# Patient Record
Sex: Female | Born: 1965
Health system: Southern US, Community
[De-identification: ages and names within clinical notes are randomized; demographics above are authoritative.]

## PROBLEM LIST (undated history)

## (undated) DIAGNOSIS — B20 Human immunodeficiency virus [HIV] disease: Secondary | ICD-10-CM

## (undated) DIAGNOSIS — L309 Dermatitis, unspecified: Secondary | ICD-10-CM

## (undated) DIAGNOSIS — M549 Dorsalgia, unspecified: Secondary | ICD-10-CM

## (undated) DIAGNOSIS — J45909 Unspecified asthma, uncomplicated: Secondary | ICD-10-CM

## (undated) DIAGNOSIS — N760 Acute vaginitis: Secondary | ICD-10-CM

## (undated) DIAGNOSIS — M1990 Unspecified osteoarthritis, unspecified site: Secondary | ICD-10-CM

## (undated) DIAGNOSIS — E785 Hyperlipidemia, unspecified: Secondary | ICD-10-CM

## (undated) DIAGNOSIS — C50912 Malignant neoplasm of unspecified site of left female breast: Secondary | ICD-10-CM

## (undated) DIAGNOSIS — M109 Gout, unspecified: Secondary | ICD-10-CM

## (undated) DIAGNOSIS — J209 Acute bronchitis, unspecified: Secondary | ICD-10-CM

## (undated) DIAGNOSIS — Z124 Encounter for screening for malignant neoplasm of cervix: Secondary | ICD-10-CM

## (undated) DIAGNOSIS — K5900 Constipation, unspecified: Secondary | ICD-10-CM

## (undated) DIAGNOSIS — I10 Essential (primary) hypertension: Secondary | ICD-10-CM

## (undated) DIAGNOSIS — Z0000 Encounter for general adult medical examination without abnormal findings: Secondary | ICD-10-CM

## (undated) DIAGNOSIS — R7611 Nonspecific reaction to tuberculin skin test without active tuberculosis: Secondary | ICD-10-CM

## (undated) DIAGNOSIS — L509 Urticaria, unspecified: Secondary | ICD-10-CM

## (undated) DIAGNOSIS — E559 Vitamin D deficiency, unspecified: Secondary | ICD-10-CM

## (undated) DIAGNOSIS — C801 Malignant (primary) neoplasm, unspecified: Secondary | ICD-10-CM

## (undated) DIAGNOSIS — T7840XA Allergy, unspecified, initial encounter: Secondary | ICD-10-CM

## (undated) DIAGNOSIS — Z21 Asymptomatic human immunodeficiency virus [HIV] infection status: Secondary | ICD-10-CM

## (undated) DIAGNOSIS — L299 Pruritus, unspecified: Secondary | ICD-10-CM

## (undated) DIAGNOSIS — E079 Disorder of thyroid, unspecified: Secondary | ICD-10-CM

## (undated) HISTORY — DX: Encounter for general adult medical examination without abnormal findings: Z00.00

## (undated) HISTORY — DX: Unspecified osteoarthritis, unspecified site: M19.90

## (undated) HISTORY — DX: Disorder of thyroid, unspecified: E07.9

## (undated) HISTORY — DX: Dermatitis, unspecified: L30.9

## (undated) HISTORY — DX: Acute vaginitis: N76.0

## (undated) HISTORY — DX: Malignant neoplasm of unspecified site of left female breast: C50.912

## (undated) HISTORY — PX: MASTECTOMY: SHX3

## (undated) HISTORY — DX: Constipation, unspecified: K59.00

## (undated) HISTORY — DX: Acute bronchitis, unspecified: J20.9

## (undated) HISTORY — DX: Pruritus, unspecified: L29.9

## (undated) HISTORY — DX: Encounter for screening for malignant neoplasm of cervix: Z12.4

## (undated) HISTORY — DX: Malignant (primary) neoplasm, unspecified: C80.1

## (undated) HISTORY — DX: Allergy, unspecified, initial encounter: T78.40XA

## (undated) HISTORY — DX: Urticaria, unspecified: L50.9

## (undated) HISTORY — DX: Essential (primary) hypertension: I10

## (undated) HISTORY — DX: Unspecified asthma, uncomplicated: J45.909

## (undated) HISTORY — DX: Gout, unspecified: M10.9

## (undated) HISTORY — DX: Human immunodeficiency virus (HIV) disease: B20

## (undated) HISTORY — DX: Vitamin D deficiency, unspecified: E55.9

## (undated) HISTORY — DX: Nonspecific reaction to tuberculin skin test without active tuberculosis: R76.11

## (undated) HISTORY — DX: Dorsalgia, unspecified: M54.9

## (undated) HISTORY — DX: Hyperlipidemia, unspecified: E78.5

## (undated) HISTORY — DX: Asymptomatic human immunodeficiency virus (hiv) infection status: Z21

---

## 2005-01-31 DIAGNOSIS — C801 Malignant (primary) neoplasm, unspecified: Secondary | ICD-10-CM

## 2005-01-31 DIAGNOSIS — C50912 Malignant neoplasm of unspecified site of left female breast: Secondary | ICD-10-CM

## 2005-01-31 HISTORY — DX: Malignant neoplasm of unspecified site of left female breast: C50.912

## 2005-01-31 HISTORY — DX: Malignant (primary) neoplasm, unspecified: C80.1

## 2013-01-31 LAB — HM MAMMOGRAPHY: HM Mammogram: NORMAL

## 2014-09-02 ENCOUNTER — Telehealth: Payer: Self-pay | Admitting: *Deleted

## 2014-09-02 NOTE — Telephone Encounter (Signed)
Medical records received via fax from Dr. Cherylin Mylar. Forwarded to Robin/Dr. Charlett Blake. JG//CMA

## 2014-09-03 ENCOUNTER — Telehealth: Payer: Self-pay | Admitting: *Deleted

## 2014-09-03 ENCOUNTER — Encounter: Payer: Self-pay | Admitting: *Deleted

## 2014-09-03 DIAGNOSIS — Z1239 Encounter for other screening for malignant neoplasm of breast: Secondary | ICD-10-CM

## 2014-09-03 NOTE — Telephone Encounter (Signed)
Unable to reach patient at time of Pre-Visit Call.  Left message for patient to return call when available.    

## 2014-09-03 NOTE — Telephone Encounter (Signed)
Pre-Visit Call completed with patient and chart updated.   Pre-Visit Info documented in Specialty Comments under [employer].    

## 2014-09-04 ENCOUNTER — Encounter: Payer: Self-pay | Admitting: Family Medicine

## 2014-09-04 ENCOUNTER — Ambulatory Visit (INDEPENDENT_AMBULATORY_CARE_PROVIDER_SITE_OTHER): Payer: BLUE CROSS/BLUE SHIELD | Admitting: Family Medicine

## 2014-09-04 VITALS — BP 124/80 | HR 71 | Temp 98.7°F | Ht 60.0 in | Wt 117.1 lb

## 2014-09-04 DIAGNOSIS — R112 Nausea with vomiting, unspecified: Secondary | ICD-10-CM | POA: Diagnosis not present

## 2014-09-04 DIAGNOSIS — M62838 Other muscle spasm: Secondary | ICD-10-CM

## 2014-09-04 DIAGNOSIS — L299 Pruritus, unspecified: Secondary | ICD-10-CM | POA: Diagnosis not present

## 2014-09-04 DIAGNOSIS — Z0000 Encounter for general adult medical examination without abnormal findings: Secondary | ICD-10-CM

## 2014-09-04 DIAGNOSIS — E559 Vitamin D deficiency, unspecified: Secondary | ICD-10-CM | POA: Insufficient documentation

## 2014-09-04 DIAGNOSIS — C50912 Malignant neoplasm of unspecified site of left female breast: Secondary | ICD-10-CM

## 2014-09-04 DIAGNOSIS — E079 Disorder of thyroid, unspecified: Secondary | ICD-10-CM | POA: Insufficient documentation

## 2014-09-04 DIAGNOSIS — N760 Acute vaginitis: Secondary | ICD-10-CM

## 2014-09-04 DIAGNOSIS — E785 Hyperlipidemia, unspecified: Secondary | ICD-10-CM

## 2014-09-04 HISTORY — DX: Pruritus, unspecified: L29.9

## 2014-09-04 MED ORDER — LORAZEPAM 0.5 MG PO TABS: 0.5000 mg | ORAL_TABLET | Freq: Two times a day (BID) | ORAL | Status: DC | PRN

## 2014-09-04 MED ORDER — ONDANSETRON HCL 4 MG PO TABS: 4.0000 mg | ORAL_TABLET | Freq: Three times a day (TID) | ORAL | Status: DC | PRN

## 2014-09-04 MED ORDER — METHOCARBAMOL 500 MG PO TABS: 500.0000 mg | ORAL_TABLET | Freq: Three times a day (TID) | ORAL | Status: DC | PRN

## 2014-09-04 NOTE — Progress Notes (Signed)
Gail Levine  [id number] 01-01-1966 09/04/2014      Progress Note-Follow Up  Subjective  Chief Complaint  Chief Complaint  Patient presents with  . Establish Care    HPI  Patient is a 49 y.o. female in today for routine medical care. Patient is in today to establish care. She has a past medical history significant for breast cancer and is requesting a referral to oncology. Also complains of intermittent pruritus but no rash.Her breast cancer was di and then recurrent in 2008. She has undergone chemotherapy and radiation. She reports her staging was 3 C. Feels well today. No recent illness. Denies CP/palp/SOB/HA/congestion/fevers/GI or GU c/o. Taking meds as prescribed  Past Medical History  Diagnosis Date  . Asthma   . Vitamin D deficiency   . Allergy   . Cancer     2007  . Positive PPD     Past Surgical History  Procedure Laterality Date  . Mastectomy      left breast    Family History  Problem Relation Age of Onset  . Cancer Mother     cancer  . Hypertension Father   . Stroke Father   . Heart disease Father   . Diabetes Father     History   Social History  . Marital Status: Single    Spouse Name: N/A  . Number of Children: N/A  . Years of Education: N/A   Occupational History  . patient care coordinator    Social History Main Topics  . Smoking status: Never Smoker   . Smokeless tobacco: Not on file  . Alcohol Use: 0.0 oz/week    0 Standard drinks or equivalent per week  . Drug Use: No  . Sexual Activity: Not on file   Other Topics Concern  . Not on file   Social History Narrative    Current Outpatient Prescriptions on File Prior to Visit  Medication Sig Dispense Refill  . albuterol (PROVENTIL HFA;VENTOLIN HFA) 108 (90 BASE) MCG/ACT inhaler Inhale 1 puff into the lungs every 6 (six) hours as needed for wheezing or shortness of breath.    . ALBUTEROL IN Inhale into the lungs.    . Ascorbic Acid (VITAMIN C) 1000 MG tablet Take 1,000 mg by mouth  daily.    Marland Kitchen b complex vitamins tablet Take 1 tablet by mouth daily.    . calcium carbonate (OS-CAL) 600 MG TABS tablet Take 1,800 mg by mouth 2 (two) times daily with a meal.    . cetirizine (ZYRTEC) 10 MG tablet Take 10 mg by mouth daily.    . diphenhydrAMINE (BENADRYL) 25 MG tablet Take 25 mg by mouth every 6 (six) hours as needed.    . Multiple Vitamin (MULTIVITAMIN) tablet Take 1 tablet by mouth daily.     No current facility-administered medications on file prior to visit.    Allergies  Allergen Reactions  . Sulfa Antibiotics Anaphylaxis    Immunization Status: Flu vaccine-- NA  Tdap-- unknown (will find records)  PNA-- NA  Shingles-- NA   A/P:  Changes to FH, PSH or Personal Hx: history of Breast CA in 2007 (states had surgeries but did not disclose, says she will bring the records of them) Pap-- would like to schedule at another appointment to do with Dr. Charlett Blake  MMG-- 2015 per patient, states she is due for another (order placed)  Bone Density-- NA  CCS-- NA   Care Teams Updated: no other providers yet (recently moved to area)  ED/Hospital/Urgent  Care Visits: none recent   **Patient plans to sign release of information form here to get info from previous PCP and Oncologist (will be with sister, Meit, at appointment  To Discuss with Provider: Patient would like referral for Dermatology and Oncology. She states she has been having muscle spasms and would like to talk about a muscle relaxer. Review of Systems  Review of Systems  Constitutional: Negative for fever, chills and malaise/fatigue.  HENT: Negative for congestion, hearing loss and nosebleeds.   Eyes: Negative for discharge.  Respiratory: Negative for cough, sputum production, shortness of breath and wheezing.   Cardiovascular: Negative for chest pain, palpitations and leg swelling.  Gastrointestinal: Negative for heartburn, nausea, vomiting, abdominal pain, diarrhea, constipation and blood in stool.    Genitourinary: Negative for dysuria, urgency, frequency and hematuria.  Musculoskeletal: Negative for myalgias, back pain and falls.  Skin: Positive for itching. Negative for rash.  Neurological: Negative for dizziness, tremors, sensory change, focal weakness, loss of consciousness, weakness and headaches.  Endo/Heme/Allergies: Negative for polydipsia. Does not bruise/bleed easily.  Psychiatric/Behavioral: Negative for depression and suicidal ideas. The patient is not nervous/anxious and does not have insomnia.     Objective  BP 124/80 mmHg  Pulse 71  Temp(Src) 98.7 F (37.1 C) (Oral)  Ht 5' (1.524 m)  Wt 117 lb 2 oz (53.128 kg)  BMI 22.87 kg/m2  SpO2 98%  LMP 08/25/2014  Physical Exam  Physical Exam  Constitutional: She is oriented to person, place, and time and well-developed, well-nourished, and in no distress. No distress.  HENT:  Head: Normocephalic and atraumatic.  Right Ear: External ear normal.  Left Ear: External ear normal.  Nose: Nose normal.  Mouth/Throat: Oropharynx is clear and moist. No oropharyngeal exudate.  Eyes: Conjunctivae are normal. Pupils are equal, round, and reactive to light. Right eye exhibits no discharge. Left eye exhibits no discharge. No scleral icterus.  Neck: Normal range of motion. Neck supple. No thyromegaly present.  Cardiovascular: Normal rate, regular rhythm, normal heart sounds and intact distal pulses.   No murmur heard. Pulmonary/Chest: Effort normal and breath sounds normal. No respiratory distress. She has no wheezes. She has no rales.  Abdominal: Soft. Bowel sounds are normal. She exhibits no distension and no mass. There is no tenderness.  Musculoskeletal: Normal range of motion. She exhibits no edema or tenderness.  Lymphadenopathy:    She has no cervical adenopathy.  Neurological: She is alert and oriented to person, place, and time. She has normal reflexes. No cranial nerve deficit. Coordination normal.  Skin: Skin is warm and  dry. No rash noted. She is not diaphoretic.  Psychiatric: Mood, memory and affect normal.     Assessment & Plan Breast cancer, left breast Will refer to oncology for surveillance  Vitamin D deficiency Encouraged daily supplements  Pruritus Encouraged daily Zyrtec and Zantac. Referred to dermatology for further consideration  Hyperlipidemia Encouraged heart healthy diet, increase exercise, avoid trans fats, consider a krill oil cap daily  Estrogen deficient vulvovaginitis Uses premarin cream sparingly

## 2014-09-04 NOTE — Progress Notes (Signed)
Pre visit review using our clinic review tool, if applicable. No additional management support is needed unless otherwise documented below in the visit note. 

## 2014-09-04 NOTE — Patient Instructions (Addendum)
Can use ginger caps, tea  Preventive Care for Adults A healthy lifestyle and preventive care can promote health and wellness. Preventive health guidelines for women include the following key practices.  A routine yearly physical is a good way to check with your health care provider about your health and preventive screening. It is a chance to share any concerns and updates on your health and to receive a thorough exam.  Visit your dentist for a routine exam and preventive care every 6 months. Brush your teeth twice a day and floss once a day. Good oral hygiene prevents tooth decay and gum disease.  The frequency of eye exams is based on your age, health, family medical history, use of contact lenses, and other factors. Follow your health care provider's recommendations for frequency of eye exams.  Eat a healthy diet. Foods like vegetables, fruits, whole grains, low-fat dairy products, and lean protein foods contain the nutrients you need without too many calories. Decrease your intake of foods high in solid fats, added sugars, and salt. Eat the right amount of calories for you.Get information about a proper diet from your health care provider, if necessary.  Regular physical exercise is one of the most important things you can do for your health. Most adults should get at least 150 minutes of moderate-intensity exercise (any activity that increases your heart rate and causes you to sweat) each week. In addition, most adults need muscle-strengthening exercises on 2 or more days a week.  Maintain a healthy weight. The body mass index (BMI) is a screening tool to identify possible weight problems. It provides an estimate of body fat based on height and weight. Your health care provider can find your BMI and can help you achieve or maintain a healthy weight.For adults 20 years and older:  A BMI below 18.5 is considered underweight.  A BMI of 18.5 to 24.9 is normal.  A BMI of 25 to 29.9 is  considered overweight.  A BMI of 30 and above is considered obese.  Maintain normal blood lipids and cholesterol levels by exercising and minimizing your intake of saturated fat. Eat a balanced diet with plenty of fruit and vegetables. Blood tests for lipids and cholesterol should begin at age 5 and be repeated every 5 years. If your lipid or cholesterol levels are high, you are over 50, or you are at high risk for heart disease, you may need your cholesterol levels checked more frequently.Ongoing high lipid and cholesterol levels should be treated with medicines if diet and exercise are not working.  If you smoke, find out from your health care provider how to quit. If you do not use tobacco, do not start.  Lung cancer screening is recommended for adults aged 2-80 years who are at high risk for developing lung cancer because of a history of smoking. A yearly low-dose CT scan of the lungs is recommended for people who have at least a 30-pack-year history of smoking and are a current smoker or have quit within the past 15 years. A pack year of smoking is smoking an average of 1 pack of cigarettes a day for 1 year (for example: 1 pack a day for 30 years or 2 packs a day for 15 years). Yearly screening should continue until the smoker has stopped smoking for at least 15 years. Yearly screening should be stopped for people who develop a health problem that would prevent them from having lung cancer treatment.  If you are pregnant, do not  drink alcohol. If you are breastfeeding, be very cautious about drinking alcohol. If you are not pregnant and choose to drink alcohol, do not have more than 1 drink per day. One drink is considered to be 12 ounces (355 mL) of beer, 5 ounces (148 mL) of wine, or 1.5 ounces (44 mL) of liquor.  Avoid use of street drugs. Do not share needles with anyone. Ask for help if you need support or instructions about stopping the use of drugs.  High blood pressure causes heart  disease and increases the risk of stroke. Your blood pressure should be checked at least every 1 to 2 years. Ongoing high blood pressure should be treated with medicines if weight loss and exercise do not work.  If you are 85-68 years old, ask your health care provider if you should take aspirin to prevent strokes.  Diabetes screening involves taking a blood sample to check your fasting blood sugar level. This should be done once every 3 years, after age 64, if you are within normal weight and without risk factors for diabetes. Testing should be considered at a younger age or be carried out more frequently if you are overweight and have at least 1 risk factor for diabetes.  Breast cancer screening is essential preventive care for women. You should practice "breast self-awareness." This means understanding the normal appearance and feel of your breasts and may include breast self-examination. Any changes detected, no matter how small, should be reported to a health care provider. Women in their 17s and 30s should have a clinical breast exam (CBE) by a health care provider as part of a regular health exam every 1 to 3 years. After age 79, women should have a CBE every year. Starting at age 66, women should consider having a mammogram (breast X-ray test) every year. Women who have a family history of breast cancer should talk to their health care provider about genetic screening. Women at a high risk of breast cancer should talk to their health care providers about having an MRI and a mammogram every year.  Breast cancer gene (BRCA)-related cancer risk assessment is recommended for women who have family members with BRCA-related cancers. BRCA-related cancers include breast, ovarian, tubal, and peritoneal cancers. Having family members with these cancers may be associated with an increased risk for harmful changes (mutations) in the breast cancer genes BRCA1 and BRCA2. Results of the assessment will determine  the need for genetic counseling and BRCA1 and BRCA2 testing.  Routine pelvic exams to screen for cancer are no longer recommended for nonpregnant women who are considered low risk for cancer of the pelvic organs (ovaries, uterus, and vagina) and who do not have symptoms. Ask your health care provider if a screening pelvic exam is right for you.  If you have had past treatment for cervical cancer or a condition that could lead to cancer, you need Pap tests and screening for cancer for at least 20 years after your treatment. If Pap tests have been discontinued, your risk factors (such as having a new sexual partner) need to be reassessed to determine if screening should be resumed. Some women have medical problems that increase the chance of getting cervical cancer. In these cases, your health care provider may recommend more frequent screening and Pap tests.  The HPV test is an additional test that may be used for cervical cancer screening. The HPV test looks for the virus that can cause the cell changes on the cervix. The cells collected  during the Pap test can be tested for HPV. The HPV test could be used to screen women aged 69 years and older, and should be used in women of any age who have unclear Pap test results. After the age of 66, women should have HPV testing at the same frequency as a Pap test.  Colorectal cancer can be detected and often prevented. Most routine colorectal cancer screening begins at the age of 52 years and continues through age 65 years. However, your health care provider may recommend screening at an earlier age if you have risk factors for colon cancer. On a yearly basis, your health care provider may provide home test kits to check for hidden blood in the stool. Use of a small camera at the end of a tube, to directly examine the colon (sigmoidoscopy or colonoscopy), can detect the earliest forms of colorectal cancer. Talk to your health care provider about this at age 54, when  routine screening begins. Direct exam of the colon should be repeated every 5-10 years through age 59 years, unless early forms of pre-cancerous polyps or small growths are found.  People who are at an increased risk for hepatitis B should be screened for this virus. You are considered at high risk for hepatitis B if:  You were born in a country where hepatitis B occurs often. Talk with your health care provider about which countries are considered high risk.  Your parents were born in a high-risk country and you have not received a shot to protect against hepatitis B (hepatitis B vaccine).  You have HIV or AIDS.  You use needles to inject street drugs.  You live with, or have sex with, someone who has hepatitis B.  You get hemodialysis treatment.  You take certain medicines for conditions like cancer, organ transplantation, and autoimmune conditions.  Hepatitis C blood testing is recommended for all people born from [birth year] through 1963 and any individual with known risks for hepatitis C.  Practice safe sex. Use condoms and avoid high-risk sexual practices to reduce the spread of sexually transmitted infections (STIs). STIs include gonorrhea, chlamydia, syphilis, trichomonas, herpes, HPV, and human immunodeficiency virus (HIV). Herpes, HIV, and HPV are viral illnesses that have no cure. They can result in disability, cancer, and death.  You should be screened for sexually transmitted illnesses (STIs) including gonorrhea and chlamydia if:  You are sexually active and are younger than 24 years.  You are older than 24 years and your health care provider tells you that you are at risk for this type of infection.  Your sexual activity has changed since you were last screened and you are at an increased risk for chlamydia or gonorrhea. Ask your health care provider if you are at risk.  If you are at risk of being infected with HIV, it is recommended that you take a prescription medicine daily  to prevent HIV infection. This is called preexposure prophylaxis (PrEP). You are considered at risk if:  You are a heterosexual woman, are sexually active, and are at increased risk for HIV infection.  You take drugs by injection.  You are sexually active with a partner who has HIV.  Talk with your health care provider about whether you are at high risk of being infected with HIV. If you choose to begin PrEP, you should first be tested for HIV. You should then be tested every 3 months for as long as you are taking PrEP.  Osteoporosis is a disease in which  the bones lose minerals and strength with aging. This can result in serious bone fractures or breaks. The risk of osteoporosis can be identified using a bone density scan. Women ages 60 years and over and women at risk for fractures or osteoporosis should discuss screening with their health care providers. Ask your health care provider whether you should take a calcium supplement or vitamin D to reduce the rate of osteoporosis.  Menopause can be associated with physical symptoms and risks. Hormone replacement therapy is available to decrease symptoms and risks. You should talk to your health care provider about whether hormone replacement therapy is right for you.  Use sunscreen. Apply sunscreen liberally and repeatedly throughout the day. You should seek shade when your shadow is shorter than you. Protect yourself by wearing long sleeves, pants, a wide-brimmed hat, and sunglasses year round, whenever you are outdoors.  Once a month, do a whole body skin exam, using a mirror to look at the skin on your back. Tell your health care provider of new moles, moles that have irregular borders, moles that are larger than a pencil eraser, or moles that have changed in shape or color.  Stay current with required vaccines (immunizations).  Influenza vaccine. All adults should be immunized every year.  Tetanus, diphtheria, and acellular pertussis (Td,  Tdap) vaccine. Pregnant women should receive 1 dose of Tdap vaccine during each pregnancy. The dose should be obtained regardless of the length of time since the last dose. Immunization is preferred during the 27th-36th week of gestation. An adult who has not previously received Tdap or who does not know her vaccine status should receive 1 dose of Tdap. This initial dose should be followed by tetanus and diphtheria toxoids (Td) booster doses every 10 years. Adults with an unknown or incomplete history of completing a 3-dose immunization series with Td-containing vaccines should begin or complete a primary immunization series including a Tdap dose. Adults should receive a Td booster every 10 years.  Varicella vaccine. An adult without evidence of immunity to varicella should receive 2 doses or a second dose if she has previously received 1 dose. Pregnant females who do not have evidence of immunity should receive the first dose after pregnancy. This first dose should be obtained before leaving the health care facility. The second dose should be obtained 4-8 weeks after the first dose.  Human papillomavirus (HPV) vaccine. Females aged 13-26 years who have not received the vaccine previously should obtain the 3-dose series. The vaccine is not recommended for use in pregnant females. However, pregnancy testing is not needed before receiving a dose. If a female is found to be pregnant after receiving a dose, no treatment is needed. In that case, the remaining doses should be delayed until after the pregnancy. Immunization is recommended for any person with an immunocompromised condition through the age of 51 years if she did not get any or all doses earlier. During the 3-dose series, the second dose should be obtained 4-8 weeks after the first dose. The third dose should be obtained 24 weeks after the first dose and 16 weeks after the second dose.  Zoster vaccine. One dose is recommended for adults aged 78 years or  older unless certain conditions are present.  Measles, mumps, and rubella (MMR) vaccine. Adults born before [birth year] generally are considered immune to measles and mumps. Adults born in [birth year] or later should have 1 or more doses of MMR vaccine unless there is a contraindication to the vaccine or there  is laboratory evidence of immunity to each of the three diseases. A routine second dose of MMR vaccine should be obtained at least 28 days after the first dose for students attending postsecondary schools, health care workers, or international travelers. People who received inactivated measles vaccine or an unknown type of measles vaccine during 1963-1967 should receive 2 doses of MMR vaccine. People who received inactivated mumps vaccine or an unknown type of mumps vaccine before 1979 and are at high risk for mumps infection should consider immunization with 2 doses of MMR vaccine. For females of childbearing age, rubella immunity should be determined. If there is no evidence of immunity, females who are not pregnant should be vaccinated. If there is no evidence of immunity, females who are pregnant should delay immunization until after pregnancy. Unvaccinated health care workers born before [birth year] who lack laboratory evidence of measles, mumps, or rubella immunity or laboratory confirmation of disease should consider measles and mumps immunization with 2 doses of MMR vaccine or rubella immunization with 1 dose of MMR vaccine.  Pneumococcal 13-valent conjugate (PCV13) vaccine. When indicated, a person who is uncertain of her immunization history and has no record of immunization should receive the PCV13 vaccine. An adult aged 93 years or older who has certain medical conditions and has not been previously immunized should receive 1 dose of PCV13 vaccine. This PCV13 should be followed with a dose of pneumococcal polysaccharide (PPSV23) vaccine. The PPSV23 vaccine dose should be obtained at least 8 weeks after the dose of  PCV13 vaccine. An adult aged 62 years or older who has certain medical conditions and previously received 1 or more doses of PPSV23 vaccine should receive 1 dose of PCV13. The PCV13 vaccine dose should be obtained 1 or more years after the last PPSV23 vaccine dose.  Pneumococcal polysaccharide (PPSV23) vaccine. When PCV13 is also indicated, PCV13 should be obtained first. All adults aged 58 years and older should be immunized. An adult younger than age 62 years who has certain medical conditions should be immunized. Any person who resides in a nursing home or long-term care facility should be immunized. An adult smoker should be immunized. People with an immunocompromised condition and certain other conditions should receive both PCV13 and PPSV23 vaccines. People with human immunodeficiency virus (HIV) infection should be immunized as soon as possible after diagnosis. Immunization during chemotherapy or radiation therapy should be avoided. Routine use of PPSV23 vaccine is not recommended for American Indians, [place] Natives, or people younger than 65 years unless there are medical conditions that require PPSV23 vaccine. When indicated, people who have unknown immunization and have no record of immunization should receive PPSV23 vaccine. One-time revaccination 5 years after the first dose of PPSV23 is recommended for people aged 19-64 years who have chronic kidney failure, nephrotic syndrome, asplenia, or immunocompromised conditions. People who received 1-2 doses of PPSV23 before age 29 years should receive another dose of PPSV23 vaccine at age 13 years or later if at least 5 years have passed since the previous dose. Doses of PPSV23 are not needed for people immunized with PPSV23 at or after age 38 years.  Meningococcal vaccine. Adults with asplenia or persistent complement component deficiencies should receive 2 doses of quadrivalent meningococcal conjugate (MenACWY-D) vaccine. The doses should be obtained at  least 2 months apart. Microbiologists working with certain meningococcal bacteria, [employer] recruits, people at risk during an outbreak, and people who travel to or live in countries with a high rate of meningitis should be immunized. A  first-year college student up through age 54 years who is living in a residence hall should receive a dose if she did not receive a dose on or after her 16th birthday. Adults who have certain high-risk conditions should receive one or more doses of vaccine.  Hepatitis A vaccine. Adults who wish to be protected from this disease, have certain high-risk conditions, work with hepatitis A-infected animals, work in hepatitis A research labs, or travel to or work in countries with a high rate of hepatitis A should be immunized. Adults who were previously unvaccinated and who anticipate close contact with an international adoptee during the first 60 days after arrival in the [place] from a country with a high rate of hepatitis A should be immunized.  Hepatitis B vaccine. Adults who wish to be protected from this disease, have certain high-risk conditions, may be exposed to blood or other infectious body fluids, are household contacts or sex partners of hepatitis B positive people, are clients or workers in certain care facilities, or travel to or work in countries with a high rate of hepatitis B should be immunized.  Haemophilus influenzae type b (Hib) vaccine. A previously unvaccinated person with asplenia or sickle cell disease or having a scheduled splenectomy should receive 1 dose of Hib vaccine. Regardless of previous immunization, a recipient of a hematopoietic stem cell transplant should receive a 3-dose series 6-12 months after her successful transplant. Hib vaccine is not recommended for adults with HIV infection. Preventive Services / Frequency Ages 66 to 25 years  Blood pressure check.** / Every 1 to 2 years.  Lipid and cholesterol check.** / Every 5 years  beginning at age 20.  Clinical breast exam.** / Every 3 years for women in their 75s and 47s.  BRCA-related cancer risk assessment.** / For women who have family members with a BRCA-related cancer (breast, ovarian, tubal, or peritoneal cancers).  Pap test.** / Every 2 years from ages 33 through 28. Every 3 years starting at age 50 through age 6 or 64 with a history of 3 consecutive normal Pap tests.  HPV screening.** / Every 3 years from ages 63 through ages 42 to 28 with a history of 3 consecutive normal Pap tests.  Hepatitis C blood test.** / For any individual with known risks for hepatitis C.  Skin self-exam. / Monthly.  Influenza vaccine. / Every year.  Tetanus, diphtheria, and acellular pertussis (Tdap, Td) vaccine.** / Consult your health care provider. Pregnant women should receive 1 dose of Tdap vaccine during each pregnancy. 1 dose of Td every 10 years.  Varicella vaccine.** / Consult your health care provider. Pregnant females who do not have evidence of immunity should receive the first dose after pregnancy.  HPV vaccine. / 3 doses over 6 months, if 42 and younger. The vaccine is not recommended for use in pregnant females. However, pregnancy testing is not needed before receiving a dose.  Measles, mumps, rubella (MMR) vaccine.** / You need at least 1 dose of MMR if you were born in 1957 or later. You may also need a 2nd dose. For females of childbearing age, rubella immunity should be determined. If there is no evidence of immunity, females who are not pregnant should be vaccinated. If there is no evidence of immunity, females who are pregnant should delay immunization until after pregnancy.  Pneumococcal 13-valent conjugate (PCV13) vaccine.** / Consult your health care provider.  Pneumococcal polysaccharide (PPSV23) vaccine.** / 1 to 2 doses if you smoke cigarettes or if you  have certain conditions.  Meningococcal vaccine.** / 1 dose if you are age 40 to 1 years and a  Market researcher living in a residence hall, or have one of several medical conditions, you need to get vaccinated against meningococcal disease. You may also need additional booster doses.  Hepatitis A vaccine.** / Consult your health care provider.  Hepatitis B vaccine.** / Consult your health care provider.  Haemophilus influenzae type b (Hib) vaccine.** / Consult your health care provider. Ages 67 to 54 years  Blood pressure check.** / Every 1 to 2 years.  Lipid and cholesterol check.** / Every 5 years beginning at age 61 years.  Lung cancer screening. / Every year if you are aged 36-80 years and have a 30-pack-year history of smoking and currently smoke or have quit within the past 15 years. Yearly screening is stopped once you have quit smoking for at least 15 years or develop a health problem that would prevent you from having lung cancer treatment.  Clinical breast exam.** / Every year after age 42 years.  BRCA-related cancer risk assessment.** / For women who have family members with a BRCA-related cancer (breast, ovarian, tubal, or peritoneal cancers).  Mammogram.** / Every year beginning at age 85 years and continuing for as long as you are in good health. Consult with your health care provider.  Pap test.** / Every 3 years starting at age 13 years through age 11 or 75 years with a history of 3 consecutive normal Pap tests.  HPV screening.** / Every 3 years from ages 49 years through ages 41 to 65 years with a history of 3 consecutive normal Pap tests.  Fecal occult blood test (FOBT) of stool. / Every year beginning at age 56 years and continuing until age 52 years. You may not need to do this test if you get a colonoscopy every 10 years.  Flexible sigmoidoscopy or colonoscopy.** / Every 5 years for a flexible sigmoidoscopy or every 10 years for a colonoscopy beginning at age 18 years and continuing until age 27 years.  Hepatitis C blood test.** / For all people  born from [birth year] through 1965 and any individual with known risks for hepatitis C.  Skin self-exam. / Monthly.  Influenza vaccine. / Every year.  Tetanus, diphtheria, and acellular pertussis (Tdap/Td) vaccine.** / Consult your health care provider. Pregnant women should receive 1 dose of Tdap vaccine during each pregnancy. 1 dose of Td every 10 years.  Varicella vaccine.** / Consult your health care provider. Pregnant females who do not have evidence of immunity should receive the first dose after pregnancy.  Zoster vaccine.** / 1 dose for adults aged 41 years or older.  Measles, mumps, rubella (MMR) vaccine.** / You need at least 1 dose of MMR if you were born in 1957 or later. You may also need a 2nd dose. For females of childbearing age, rubella immunity should be determined. If there is no evidence of immunity, females who are not pregnant should be vaccinated. If there is no evidence of immunity, females who are pregnant should delay immunization until after pregnancy.  Pneumococcal 13-valent conjugate (PCV13) vaccine.** / Consult your health care provider.  Pneumococcal polysaccharide (PPSV23) vaccine.** / 1 to 2 doses if you smoke cigarettes or if you have certain conditions.  Meningococcal vaccine.** / Consult your health care provider.  Hepatitis A vaccine.** / Consult your health care provider.  Hepatitis B vaccine.** / Consult your health care provider.  Haemophilus influenzae type b (Hib) vaccine.** /  Consult your health care provider. Ages 71 years and over  Blood pressure check.** / Every 1 to 2 years.  Lipid and cholesterol check.** / Every 5 years beginning at age 27 years.  Lung cancer screening. / Every year if you are aged 101-80 years and have a 30-pack-year history of smoking and currently smoke or have quit within the past 15 years. Yearly screening is stopped once you have quit smoking for at least 15 years or develop a health problem that would prevent you from  having lung cancer treatment.  Clinical breast exam.** / Every year after age 27 years.  BRCA-related cancer risk assessment.** / For women who have family members with a BRCA-related cancer (breast, ovarian, tubal, or peritoneal cancers).  Mammogram.** / Every year beginning at age 39 years and continuing for as long as you are in good health. Consult with your health care provider.  Pap test.** / Every 3 years starting at age 1 years through age 71 or 21 years with 3 consecutive normal Pap tests. Testing can be stopped between 65 and 70 years with 3 consecutive normal Pap tests and no abnormal Pap or HPV tests in the past 10 years.  HPV screening.** / Every 3 years from ages 98 years through ages 23 or 70 years with a history of 3 consecutive normal Pap tests. Testing can be stopped between 65 and 70 years with 3 consecutive normal Pap tests and no abnormal Pap or HPV tests in the past 10 years.  Fecal occult blood test (FOBT) of stool. / Every year beginning at age 54 years and continuing until age 74 years. You may not need to do this test if you get a colonoscopy every 10 years.  Flexible sigmoidoscopy or colonoscopy.** / Every 5 years for a flexible sigmoidoscopy or every 10 years for a colonoscopy beginning at age 67 years and continuing until age 8 years.  Hepatitis C blood test.** / For all people born from [birth year] through 1963 and any individual with known risks for hepatitis C.  Osteoporosis screening.** / A one-time screening for women ages 32 years and over and women at risk for fractures or osteoporosis.  Skin self-exam. / Monthly.  Influenza vaccine. / Every year.  Tetanus, diphtheria, and acellular pertussis (Tdap/Td) vaccine.** / 1 dose of Td every 10 years.  Varicella vaccine.** / Consult your health care provider.  Zoster vaccine.** / 1 dose for adults aged 84 years or older.  Pneumococcal 13-valent conjugate (PCV13) vaccine.** / Consult your health care  provider.  Pneumococcal polysaccharide (PPSV23) vaccine.** / 1 dose for all adults aged 75 years and older.  Meningococcal vaccine.** / Consult your health care provider.  Hepatitis A vaccine.** / Consult your health care provider.  Hepatitis B vaccine.** / Consult your health care provider.  Haemophilus influenzae type b (Hib) vaccine.** / Consult your health care provider. ** Family history and personal history of risk and conditions may change your health care provider's recommendations. Document Released: 03/15/2001 Document Revised: 06/03/2013 Document Reviewed: 06/14/2010 [facility] Patient Information 2015 [place], [place]. This information is not intended to replace advice given to you by your health care provider. Make sure you discuss any questions you have with your health care provider.

## 2014-09-05 LAB — TSH: TSH: 2.72 u[IU]/mL (ref 0.35–4.50)

## 2014-09-05 LAB — COMPREHENSIVE METABOLIC PANEL
ALK PHOS: 49 U/L (ref 39–117)
ALT: 12 U/L (ref 0–35)
AST: 20 U/L (ref 0–37)
Albumin: 4.1 g/dL (ref 3.5–5.2)
BILIRUBIN TOTAL: 0.5 mg/dL (ref 0.2–1.2)
BUN: 10 mg/dL (ref 6–23)
CO2: 29 mEq/L (ref 19–32)
CREATININE: 0.7 mg/dL (ref 0.40–1.20)
Calcium: 8.8 mg/dL (ref 8.4–10.5)
Chloride: 104 mEq/L (ref 96–112)
GFR: 95.43 mL/min (ref >60.00)
GLUCOSE: 83 mg/dL (ref 70–99)
Potassium: 3.7 mEq/L (ref 3.5–5.1)
SODIUM: 140 meq/L (ref 135–145)
TOTAL PROTEIN: 7.7 g/dL (ref 6.0–8.3)

## 2014-09-05 LAB — LIPID PANEL
Cholesterol: 184 mg/dL (ref 0–200)
HDL: 66.5 mg/dL (ref >39.00)
LDL Cholesterol: 102 mg/dL — ABNORMAL HIGH (ref 0–99)
NONHDL: 117.41
Total CHOL/HDL Ratio: 3
Triglycerides: 75 mg/dL (ref 0.0–149.0)
VLDL: 15 mg/dL (ref 0.0–40.0)

## 2014-09-05 LAB — CBC
HEMATOCRIT: 39 % (ref 36.0–46.0)
HEMOGLOBIN: 12.8 g/dL (ref 12.0–15.0)
MCHC: 32.7 g/dL (ref 30.0–36.0)
MCV: 93.2 fl (ref 78.0–100.0)
Platelets: 225 10*3/uL (ref 150.0–400.0)
RBC: 4.19 Mil/uL (ref 3.87–5.11)
RDW: 13.2 % (ref 11.5–15.5)
WBC: 4.7 10*3/uL (ref 4.0–10.5)

## 2014-09-05 LAB — T4, FREE: Free T4: 0.76 ng/dL (ref 0.60–1.60)

## 2014-09-10 ENCOUNTER — Telehealth: Payer: Self-pay | Admitting: Hematology & Oncology

## 2014-09-10 NOTE — Telephone Encounter (Signed)
Lt mess regarding new pt appt on 9/21

## 2014-09-12 ENCOUNTER — Other Ambulatory Visit: Payer: Self-pay | Admitting: Family Medicine

## 2014-09-12 DIAGNOSIS — Z9012 Acquired absence of left breast and nipple: Secondary | ICD-10-CM

## 2014-09-12 DIAGNOSIS — Z1239 Encounter for other screening for malignant neoplasm of breast: Secondary | ICD-10-CM

## 2014-09-14 ENCOUNTER — Encounter: Payer: Self-pay | Admitting: Family Medicine

## 2014-09-14 DIAGNOSIS — Z171 Estrogen receptor negative status [ER-]: Secondary | ICD-10-CM | POA: Insufficient documentation

## 2014-09-14 DIAGNOSIS — E785 Hyperlipidemia, unspecified: Secondary | ICD-10-CM | POA: Insufficient documentation

## 2014-09-14 DIAGNOSIS — C50012 Malignant neoplasm of nipple and areola, left female breast: Secondary | ICD-10-CM | POA: Insufficient documentation

## 2014-09-14 DIAGNOSIS — N760 Acute vaginitis: Secondary | ICD-10-CM

## 2014-09-14 DIAGNOSIS — R7611 Nonspecific reaction to tuberculin skin test without active tuberculosis: Secondary | ICD-10-CM | POA: Insufficient documentation

## 2014-09-14 HISTORY — DX: Acute vaginitis: N76.0

## 2014-09-14 NOTE — Assessment & Plan Note (Signed)
Encouraged daily supplements 

## 2014-09-14 NOTE — Assessment & Plan Note (Signed)
Encouraged heart healthy diet, increase exercise, avoid trans fats, consider a krill oil cap daily 

## 2014-09-14 NOTE — Assessment & Plan Note (Signed)
Encouraged daily Zyrtec and Zantac. Referred to dermatology for further consideration

## 2014-09-14 NOTE — Assessment & Plan Note (Signed)
Uses premarin cream sparingly

## 2014-09-14 NOTE — Assessment & Plan Note (Addendum)
Will refer to oncology for surveillance

## 2014-10-03 ENCOUNTER — Telehealth: Payer: Self-pay | Admitting: Hematology & Oncology

## 2014-10-03 NOTE — Telephone Encounter (Signed)
Mailed out calendar and new patient letter

## 2014-10-22 ENCOUNTER — Other Ambulatory Visit: Payer: BLUE CROSS/BLUE SHIELD

## 2014-10-22 ENCOUNTER — Ambulatory Visit: Payer: BLUE CROSS/BLUE SHIELD | Admitting: Hematology & Oncology

## 2014-10-22 ENCOUNTER — Ambulatory Visit: Payer: BLUE CROSS/BLUE SHIELD

## 2014-10-23 ENCOUNTER — Ambulatory Visit
Admission: RE | Admit: 2014-10-23 | Discharge: 2014-10-23 | Disposition: A | Discharge status: Home / Self Care | Payer: BLUE CROSS/BLUE SHIELD | Source: Ambulatory Visit | Attending: Family Medicine | Admitting: Family Medicine

## 2014-10-23 DIAGNOSIS — Z9012 Acquired absence of left breast and nipple: Secondary | ICD-10-CM

## 2014-10-23 DIAGNOSIS — Z1239 Encounter for other screening for malignant neoplasm of breast: Secondary | ICD-10-CM

## 2014-11-20 ENCOUNTER — Encounter: Payer: Self-pay | Admitting: Family

## 2014-11-20 ENCOUNTER — Other Ambulatory Visit (HOSPITAL_BASED_OUTPATIENT_CLINIC_OR_DEPARTMENT_OTHER): Payer: BLUE CROSS/BLUE SHIELD

## 2014-11-20 ENCOUNTER — Ambulatory Visit (HOSPITAL_BASED_OUTPATIENT_CLINIC_OR_DEPARTMENT_OTHER): Payer: BLUE CROSS/BLUE SHIELD | Admitting: Family

## 2014-11-20 ENCOUNTER — Ambulatory Visit: Payer: BLUE CROSS/BLUE SHIELD

## 2014-11-20 VITALS — BP 109/66 | HR 66 | Temp 97.8°F | Resp 16 | Ht 60.0 in | Wt 119.0 lb

## 2014-11-20 DIAGNOSIS — C50012 Malignant neoplasm of nipple and areola, left female breast: Secondary | ICD-10-CM | POA: Diagnosis not present

## 2014-11-20 DIAGNOSIS — Z853 Personal history of malignant neoplasm of breast: Secondary | ICD-10-CM | POA: Diagnosis not present

## 2014-11-20 LAB — COMPREHENSIVE METABOLIC PANEL (CC13)
ALT: 14 U/L (ref 0–55)
AST: 21 U/L (ref 5–34)
Albumin: 3.7 g/dL (ref 3.5–5.0)
Alkaline Phosphatase: 52 U/L (ref 40–150)
Anion Gap: 6 mEq/L (ref 3–11)
BILIRUBIN TOTAL: 0.31 mg/dL (ref 0.20–1.20)
BUN: 13.2 mg/dL (ref 7.0–26.0)
CHLORIDE: 107 meq/L (ref 98–109)
CO2: 27 meq/L (ref 22–29)
CREATININE: 0.7 mg/dL (ref 0.6–1.1)
Calcium: 9 mg/dL (ref 8.4–10.4)
EGFR: >90 mL/min/{1.73_m2} (ref >90)
GLUCOSE: 134 mg/dL (ref 70–140)
Potassium: 3.8 mEq/L (ref 3.5–5.1)
Sodium: 141 mEq/L (ref 136–145)
TOTAL PROTEIN: 7.5 g/dL (ref 6.4–8.3)

## 2014-11-20 LAB — CBC WITH DIFFERENTIAL (CANCER CENTER ONLY)
BASO#: 0 10*3/uL (ref 0.0–0.2)
BASO%: 0.4 % (ref 0.0–2.0)
EOS ABS: 0.3 10*3/uL (ref 0.0–0.5)
EOS%: 5.2 % (ref 0.0–7.0)
HCT: 37.6 % (ref 34.8–46.6)
HEMOGLOBIN: 12.3 g/dL (ref 11.6–15.9)
LYMPH#: 1.4 10*3/uL (ref 0.9–3.3)
LYMPH%: 27.8 % (ref 14.0–48.0)
MCH: 30.8 pg (ref 26.0–34.0)
MCHC: 32.7 g/dL (ref 32.0–36.0)
MCV: 94 fL (ref 81–101)
MONO#: 0.6 10*3/uL (ref 0.1–0.9)
MONO%: 11.9 % (ref 0.0–13.0)
NEUT%: 54.7 % (ref 39.6–80.0)
NEUTROS ABS: 2.8 10*3/uL (ref 1.5–6.5)
PLATELETS: 170 10*3/uL (ref 145–400)
RBC: 4 10*6/uL (ref 3.70–5.32)
RDW: 12.5 % (ref 11.1–15.7)
WBC: 5 10*3/uL (ref 3.9–10.0)

## 2014-11-20 MED ORDER — ERGOCALCIFEROL 1.25 MG (50000 UT) PO CAPS: 50000.0000 [IU] | ORAL_CAPSULE | ORAL | Status: DC

## 2014-11-20 NOTE — Progress Notes (Signed)
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 She was triple negative and also BRCA negative. She had a left mastectomy in August 2007 followed by adjuvant chemotherapy with Adriamycin, Cytoxan and Taxol (September 2007 - January 2008). She also received radiation to the chest (February - March 2008).  She has done well since then and there has been no evidence of recurrence. Her mammogram in September showed no evidence of malignancy.  She has pain over the mastectomy site and in the left axilla due to nerve damage and neuropathy. The area is very sensitive to touch. She states that she does not like to take pain medication and prefers to avoid it.  No lymphadenopathy found on exam. She has some swelling of the arms and legs at times when she is active. She does daily lymphedema massages on herself which really helps keep the fluid down. She does not appear to have any swelling at this time.  She has some SOB since radiation and uses an inhaler as needed. No cough.  She has no other personal cancer history. Family cancer history includes: mother - uterine, father - lung and sister - uterine.  She has had no issues with infections. No fever, chills, n/v, rash, dizziness, chest pain, palpitations, abdominal pain, constipation, diarrhea or problems urinating.  No anemia. She has had no episodes of bleeding or bruising.  She still has all her female organs and describes her cycles as normal, not heavy, no clots. She has no children. She states that she  is having her PAP smear next month.  She does not smoke or drink alcohol.  She has a healthy appetite and stays well hydrated. No significant weight loss or gain. She states that she drinks 2 gallons of water a day.  She immigrated from [place] in 1989 with her parents and sisters. 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   ROS: All other 10 point review of systems is negative.   PAST MEDICAL HISTORY:   Past Medical History  Diagnosis Date  . Asthma   . Vitamin D deficiency   . Allergy   . Positive PPD   . Pruritus 09/04/2014  . HIV infection   . Cancer     2007  . Hyperlipidemia   . Thyroid disease   . Breast cancer, left breast     2007   . Estrogen deficient vulvovaginitis 09/14/2014    ALLERGIES: Allergies  Allergen Reactions  . Sulfa Antibiotics Anaphylaxis      MEDICATIONS:  Current Outpatient Prescriptions on File Prior to Visit  Medication Sig Dispense Refill  . albuterol (PROVENTIL HFA;VENTOLIN HFA) 108 (90 BASE) MCG/ACT inhaler Inhale 1 puff into the lungs every 6 (six) hours as needed for wheezing or shortness of breath.    . ALBUTEROL IN Inhale into the lungs.    . Ascorbic Acid (VITAMIN C) 1000 MG tablet Take 1,000 mg by mouth daily.    Marland Kitchen b complex vitamins  tablet Take 1 tablet by mouth daily.    . calcium carbonate (OS-CAL) 600 MG TABS tablet Take 1,800 mg by mouth 2 (two) times daily with a meal.    . cetirizine (ZYRTEC) 10 MG tablet Take 10 mg by mouth daily.    . diphenhydrAMINE (BENADRYL) 25 MG tablet Take 25 mg by mouth every 6 (six) hours as needed.    Marland Kitchen LORazepam (ATIVAN) 0.5 MG tablet Take 1 tablet (0.5 mg total) by mouth 2 (two) times daily as needed for anxiety. 5 tablet 0  . methocarbamol (ROBAXIN) 500 MG tablet Take 1 tablet (500 mg total) by mouth every 8 (eight) hours as needed for muscle spasms. 10 tablet 1  . Multiple Vitamin (MULTIVITAMIN) tablet Take 1 tablet by mouth daily.    . ondansetron (ZOFRAN) 4 MG tablet Take 1 tablet  (4 mg total) by mouth every 8 (eight) hours as needed for nausea or vomiting. 20 tablet 1   No current facility-administered medications on file prior to visit.     PAST SURGICAL HISTORY Past Surgical History  Procedure Laterality Date  . Mastectomy      left breast    FAMILY HISTORY: Family History  Problem Relation Age of Onset  . Cancer Mother     cancer  . Hypertension Father   . Stroke Father   . Heart disease Father   . Diabetes Father     SOCIAL HISTORY:  reports that she has never smoked. She does not have any smokeless tobacco history on file. She reports that she drinks alcohol. She reports that she does not use illicit drugs.  PERFORMANCE STATUS: The patient's performance status is 0 - Asymptomatic  PHYSICAL EXAM: Most Recent Vital Signs: There were no vitals taken for this visit. BP 109/66 mmHg  Pulse 66  Temp(Src) 97.8 F (36.6 C)  Resp 16  Ht 5' (1.524 m)  Wt 119 lb (53.978 kg)  BMI 23.24 kg/m2  General Appearance:    Alert, cooperative, no distress, appears stated age  Head:    Normocephalic, without obvious abnormality, atraumatic  Eyes:    PERRL, conjunctiva/corneas clear, EOM's intact, fundi    benign, both eyes        Throat:   Lips, mucosa, and tongue normal; teeth and gums normal  Neck:   Supple, symmetrical, trachea midline, no adenopathy;    thyroid:  no enlargement/tenderness/nodules; no carotid   bruit or JVD  Back:     Symmetric, no curvature, ROM normal, no CVA tenderness  Lungs:     Clear to auscultation bilaterally, respirations unlabored  Chest Wall:    No tenderness or deformity   Heart:    Regular rate and rhythm, S1 and S2 normal, no murmur, rub   or gallop  Breast Exam:    Left breast surgically absent. Tenderness across the left chest and left axillary since surgery unchanged. No changes with the right breast. No masses, lesion or nipple abnormality  Abdomen:     Soft, non-tender, bowel sounds active all four quadrants,    no  masses, no organomegaly        Extremities:   Extremities normal, atraumatic, no cyanosis or edema  Pulses:   2+ and symmetric all extremities  Skin:   Skin color, texture, turgor normal, no rashes or lesions  Lymph nodes:   Cervical, supraclavicular, and axillary nodes normal  Neurologic:   CNII-XII intact, normal strength, sensation and reflexes    throughout   LABORATORY DATA:  No  results found for this or any previous visit (from the past 71 hour(s)).    RADIOGRAPHY: No results found.     PATHOLOGY: None   ASSESSMENT/PLAN: Ms. Devora is a very pleasant 49 yo female with history of stage IIIC (T2N3) grade 3 IDC of the left breast diagnosed in 2007. She was triple negative and also BRCA negative. She had a left mastectomy in August 2007 followed by adjuvant chemotherapy with Adriamycin, Cytoxan and Taxol (September 2007 - January 2008). She also received radiation to the chest ( February 2008 - March 2008.  She is now 9 years out from diagnosis and has done well. So far, there has been no evidence of recurrence. She eats healthy and stays active. Her mammogram last month was negative for malignancy.  We will have her start taking vitamin D 50,000 units PO once a week.  We will continue to follow along with her and plan to Lyness her back in 6 months.  All questions were answered. She knows to contact us with any problems, questions or concerns. We can certainly Karabin her much sooner if necessary.  She was discussed with and also seen by Dr. Marin Olp and he is in agreement with the aforementioned.   [facility]     Addendum:  I saw and examined the patient with Alicya Bena. She was diagnosed almost 10 years ago. As such, given that she has triple negative disease, I would think that her risk of her recurrence will be less than 10% at this point.  She was tested for BRCA and this was normal.  Her exam was unremarkable.  I thought that having her on vitamin D will help. I told her to  make sure she takes 50,000 units weekly.  I don't Tupou that she needs any scans.  Her labs all look okay.  I suppose one thing we have to watch off for is the possibility of lung cancer. She did receive radiation therapy to the chest wall. This could put her at higher risk for lung cancer or even sarcoma.  Any symptoms that she may have referred to the left chest wall I we would have to address aggressively.  We spent a good 45 minutes with her. We answered all of her questions. We reassured her.  We will get her back in 6 months.  Laurey Arrow

## 2014-11-21 LAB — LACTATE DEHYDROGENASE: LDH: 157 U/L (ref 94–250)

## 2014-11-21 LAB — CANCER ANTIGEN 27.29: CA 27.29: 14 U/mL (ref 0–39)

## 2015-01-01 ENCOUNTER — Other Ambulatory Visit (HOSPITAL_COMMUNITY)
Admission: RE | Admit: 2015-01-01 | Discharge: 2015-01-01 | Disposition: A | Discharge status: Home / Self Care | Payer: BLUE CROSS/BLUE SHIELD | Source: Ambulatory Visit | Attending: Family Medicine | Admitting: Family Medicine

## 2015-01-01 ENCOUNTER — Encounter: Payer: Self-pay | Admitting: Family Medicine

## 2015-01-01 ENCOUNTER — Ambulatory Visit (INDEPENDENT_AMBULATORY_CARE_PROVIDER_SITE_OTHER): Payer: BLUE CROSS/BLUE SHIELD | Admitting: Family Medicine

## 2015-01-01 VITALS — BP 95/62 | HR 61 | Temp 98.3°F | Ht 60.0 in | Wt 121.6 lb

## 2015-01-01 DIAGNOSIS — E079 Disorder of thyroid, unspecified: Secondary | ICD-10-CM

## 2015-01-01 DIAGNOSIS — Z01419 Encounter for gynecological examination (general) (routine) without abnormal findings: Secondary | ICD-10-CM | POA: Insufficient documentation

## 2015-01-01 DIAGNOSIS — L309 Dermatitis, unspecified: Secondary | ICD-10-CM

## 2015-01-01 DIAGNOSIS — E785 Hyperlipidemia, unspecified: Secondary | ICD-10-CM

## 2015-01-01 DIAGNOSIS — Z124 Encounter for screening for malignant neoplasm of cervix: Secondary | ICD-10-CM

## 2015-01-01 DIAGNOSIS — E559 Vitamin D deficiency, unspecified: Secondary | ICD-10-CM

## 2015-01-01 DIAGNOSIS — E782 Mixed hyperlipidemia: Secondary | ICD-10-CM

## 2015-01-01 DIAGNOSIS — C50122 Malignant neoplasm of central portion of left male breast: Secondary | ICD-10-CM

## 2015-01-01 HISTORY — DX: Encounter for screening for malignant neoplasm of cervix: Z12.4

## 2015-01-01 NOTE — Patient Instructions (Signed)

## 2015-01-01 NOTE — Progress Notes (Signed)
Pre visit review using our clinic review tool, if applicable. No additional management support is needed unless otherwise documented below in the visit note. 

## 2015-01-01 NOTE — Assessment & Plan Note (Signed)
Pap today, no concerns on exam.  

## 2015-01-02 ENCOUNTER — Telehealth: Payer: Self-pay | Admitting: Family Medicine

## 2015-01-02 MED ORDER — TRIAMCINOLONE ACETONIDE 0.1 % EX CREA: 1.0000 "application " | TOPICAL_CREAM | Freq: Two times a day (BID) | CUTANEOUS | Status: DC

## 2015-01-02 NOTE — Telephone Encounter (Signed)
Called the patient left msg. crm sent in.

## 2015-01-02 NOTE — Telephone Encounter (Signed)
-----   Message from Mosie Lukes, MD sent at 01/02/2015  9:00 AM EST ----- In case she calls in I just sent the steroid cream she requested yesterday

## 2015-01-04 ENCOUNTER — Encounter: Payer: Self-pay | Admitting: Family Medicine

## 2015-01-04 DIAGNOSIS — L309 Dermatitis, unspecified: Secondary | ICD-10-CM

## 2015-01-04 HISTORY — DX: Dermatitis, unspecified: L30.9

## 2015-01-04 NOTE — Progress Notes (Signed)
Subjective:    Patient ID: Gail Levine, female    DOB: January 01, 1966, 49 y.o.   MRN: [id number]  Chief Complaint  Patient presents with  . Follow-up    Pap only    HPI Patient is in today for Pap smear and follow-up. She reports feeling well. No recent illness or acute concerns. She is anxious about her pelvic exam. She is not currently sexually active and has not been so for many years. Denies any vaginal discharge or any concerns. Continues to follow with oncology for her breast cancer and is doing well no new concerns. Does note she's had trouble off and on for years with some dermatitis most notably on her hands but has used triamcinolone with good results and is asking for a prescription today. Denies CP/palp/SOB/HA/congestion/fevers/GI or GU c/o. Taking meds as prescribed  Past Medical History  Diagnosis Date  . Asthma   . Vitamin D deficiency   . Allergy   . Positive PPD   . Pruritus 09/04/2014  . HIV infection ([place])   . Cancer ([place])     2007  . Hyperlipidemia   . Thyroid disease   . Breast cancer, left breast ([place])     2007   . Estrogen deficient vulvovaginitis 09/14/2014  . Cervical cancer screening 01/01/2015  . Dermatitis 01/04/2015    Past Surgical History  Procedure Laterality Date  . Mastectomy      left breast    Family History  Problem Relation Age of Onset  . Cancer Mother     cancer  . Hypertension Father   . Stroke Father   . Heart disease Father   . Diabetes Father     Social History   Social History  . Marital Status: Single    Spouse Name: N/A  . Number of Children: N/A  . Years of Education: N/A   Occupational History  . patient care coordinator    Social History Main Topics  . Smoking status: Never Smoker   . Smokeless tobacco: Not on file  . Alcohol Use: 0.0 oz/week    0 Standard drinks or equivalent per week  . Drug Use: No  . Sexual Activity: Not on file   Other Topics Concern  . Not on file   Social History Narrative     Outpatient Prescriptions Prior to Visit  Medication Sig Dispense Refill  . albuterol (PROVENTIL HFA;VENTOLIN HFA) 108 (90 BASE) MCG/ACT inhaler Inhale 1 puff into the lungs every 6 (six) hours as needed for wheezing or shortness of breath.    . Ascorbic Acid (VITAMIN C) 1000 MG tablet Take 1,000 mg by mouth daily.    Marland Kitchen b complex vitamins tablet Take 1 tablet by mouth daily.    . calcium carbonate (OS-CAL) 600 MG TABS tablet Take 1,800 mg by mouth 2 (two) times daily with a meal.    . cetirizine (ZYRTEC) 10 MG tablet Take 10 mg by mouth daily.    . diphenhydrAMINE (BENADRYL) 25 MG tablet Take 25 mg by mouth every 6 (six) hours as needed.    . doxepin (SINEQUAN) 10 MG capsule Take 10 mg by mouth.    . ergocalciferol (VITAMIN D2) 50000 UNITS capsule Take 1 capsule (50,000 Units total) by mouth once a week. 30 capsule 4  . hydroquinone 4 % cream Apply topically daily.    Marland Kitchen LORazepam (ATIVAN) 0.5 MG tablet Take 1 tablet (0.5 mg total) by mouth 2 (two) times daily as needed for anxiety. 5 tablet 0  .  methocarbamol (ROBAXIN) 500 MG tablet Take 1 tablet (500 mg total) by mouth every 8 (eight) hours as needed for muscle spasms. 10 tablet 1  . Multiple Vitamin (MULTIVITAMIN) tablet Take 1 tablet by mouth daily.    . ondansetron (ZOFRAN) 4 MG tablet Take 1 tablet (4 mg total) by mouth every 8 (eight) hours as needed for nausea or vomiting. 20 tablet 1  . ALBUTEROL IN Inhale into the lungs.     No facility-administered medications prior to visit.    Allergies  Allergen Reactions  . Sulfa Antibiotics Anaphylaxis    Review of Systems  Constitutional: Negative for fever and malaise/fatigue.  HENT: Negative for congestion.   Eyes: Negative for discharge.  Respiratory: Negative for shortness of breath.   Cardiovascular: Negative for chest pain, palpitations and leg swelling.  Gastrointestinal: Negative for nausea and abdominal pain.  Genitourinary: Negative for dysuria.  Musculoskeletal:  Negative for falls.  Skin: Positive for itching and rash.  Neurological: Negative for loss of consciousness and headaches.  Endo/Heme/Allergies: Negative for environmental allergies.  Psychiatric/Behavioral: Negative for depression. The patient is nervous/anxious.        Objective:    Physical Exam  Constitutional: She is oriented to person, place, and time. She appears well-developed and well-nourished. No distress.  HENT:  Head: Normocephalic and atraumatic.  Nose: Nose normal.  Eyes: Right eye exhibits no discharge. Left eye exhibits no discharge.  Neck: Normal range of motion. Neck supple.  Cardiovascular: Normal rate and regular rhythm.   No murmur heard. Pulmonary/Chest: Effort normal and breath sounds normal.  Abdominal: Soft. Bowel sounds are normal. There is no tenderness.  Genitourinary: Vagina normal and uterus normal. No vaginal discharge found.  No vaginal, vulvar or cervical lesions  Musculoskeletal: She exhibits no edema.  Neurological: She is alert and oriented to person, place, and time.  Skin: Skin is warm and dry.  Psychiatric: She has a normal mood and affect.  Nursing note and vitals reviewed.   BP 95/62 mmHg  Pulse 61  Temp(Src) 98.3 F (36.8 C) (Oral)  Ht 5' (1.524 m)  Wt 121 lb 9.6 oz (55.157 kg)  BMI 23.75 kg/m2  SpO2 100%  LMP 11/01/2014 Wt Readings from Last 3 Encounters:  01/01/15 121 lb 9.6 oz (55.157 kg)  11/20/14 119 lb (53.978 kg)  09/04/14 117 lb 2 oz (53.128 kg)     Lab Results  Component Value Date   WBC 5.0 11/20/2014   HGB 12.3 11/20/2014   HCT 37.6 11/20/2014   PLT 170 11/20/2014   GLUCOSE 134 11/20/2014   CHOL 184 09/04/2014   TRIG 75.0 09/04/2014   HDL 66.50 09/04/2014   LDLCALC 102* 09/04/2014   ALT 14 11/20/2014   AST 21 11/20/2014   NA 141 11/20/2014   K 3.8 11/20/2014   CL 104 09/04/2014   CREATININE 0.7 11/20/2014   BUN 13.2 11/20/2014   CO2 27 11/20/2014   TSH 2.72 09/04/2014    Lab Results  Component  Value Date   TSH 2.72 09/04/2014   Lab Results  Component Value Date   WBC 5.0 11/20/2014   HGB 12.3 11/20/2014   HCT 37.6 11/20/2014   MCV 94 11/20/2014   PLT 170 11/20/2014   Lab Results  Component Value Date   NA 141 11/20/2014   K 3.8 11/20/2014   CHLORIDE 107 11/20/2014   CO2 27 11/20/2014   GLUCOSE 134 11/20/2014   BUN 13.2 11/20/2014   CREATININE 0.7 11/20/2014   BILITOT 0.31 11/20/2014  ALKPHOS 52 11/20/2014   AST 21 11/20/2014   ALT 14 11/20/2014   PROT 7.5 11/20/2014   ALBUMIN 3.7 11/20/2014   CALCIUM 9.0 11/20/2014   ANIONGAP 6 11/20/2014   EGFR >90 11/20/2014   GFR 95.43 09/04/2014   Lab Results  Component Value Date   CHOL 184 09/04/2014   Lab Results  Component Value Date   HDL 66.50 09/04/2014   Lab Results  Component Value Date   LDLCALC 102* 09/04/2014   Lab Results  Component Value Date   TRIG 75.0 09/04/2014   Lab Results  Component Value Date   CHOLHDL 3 09/04/2014   No results found for: HGBA1C     Assessment & Plan:   Problem List Items Addressed This Visit    Breast cancer, left breast ([place])    Continues to follow with oncology and they do breast exam. No new concerns      Cervical cancer screening - Primary    Pap today, no concerns on exam.       Relevant Orders   Cytology - PAP   Dermatitis    Has used Triamcinolone with good results given a prescription today      Relevant Medications   triamcinolone cream (KENALOG) 0.1 %   Hyperlipidemia    Encouraged heart healthy diet, increase exercise, avoid trans fats, consider a krill oil cap daily      Vitamin D deficiency    Encouraged daily supplements, 2000 IU daily      Relevant Orders   Vitamin D (25 hydroxy)    Other Visit Diagnoses    Hyperlipemia, mixed        Relevant Orders    Lipid Profile    Comp Met (CMET)    CBC w/Diff    Thyroid disorder        Relevant Orders    TSH       I have discontinued Ms. Varas's ALBUTEROL IN. I am also having her  start on triamcinolone cream. Additionally, I am having her maintain her multivitamin, albuterol, vitamin C, calcium carbonate, cetirizine, b complex vitamins, diphenhydrAMINE, LORazepam, ondansetron, methocarbamol, hydroquinone, doxepin, and ergocalciferol.  Meds ordered this encounter  Medications  . triamcinolone cream (KENALOG) 0.1 %    Sig: Apply 1 application topically 2 (two) times daily.    Dispense:  80 g    Refill:  2     Penni Homans, MD

## 2015-01-04 NOTE — Assessment & Plan Note (Signed)
Encouraged heart healthy diet, increase exercise, avoid trans fats, consider a krill oil cap daily 

## 2015-01-04 NOTE — Assessment & Plan Note (Signed)
Has used Triamcinolone with good results given a prescription today

## 2015-01-04 NOTE — Assessment & Plan Note (Signed)
Encouraged daily supplements, 2000 IU daily

## 2015-01-04 NOTE — Assessment & Plan Note (Signed)
Continues to follow with oncology and they do breast exam. No new concerns

## 2015-01-05 LAB — CYTOLOGY - PAP

## 2015-01-06 ENCOUNTER — Encounter: Payer: Self-pay | Admitting: Family Medicine

## 2015-01-08 ENCOUNTER — Ambulatory Visit: Payer: BLUE CROSS/BLUE SHIELD | Admitting: Family Medicine

## 2015-01-20 ENCOUNTER — Ambulatory Visit: Payer: BLUE CROSS/BLUE SHIELD | Admitting: Family Medicine

## 2015-02-09 ENCOUNTER — Other Ambulatory Visit: Payer: Self-pay | Admitting: Family Medicine

## 2015-02-09 DIAGNOSIS — M62838 Other muscle spasm: Secondary | ICD-10-CM

## 2015-02-09 DIAGNOSIS — Z0000 Encounter for general adult medical examination without abnormal findings: Secondary | ICD-10-CM

## 2015-02-09 DIAGNOSIS — R112 Nausea with vomiting, unspecified: Secondary | ICD-10-CM

## 2015-02-09 DIAGNOSIS — L299 Pruritus, unspecified: Secondary | ICD-10-CM

## 2015-02-09 MED ORDER — ALBUTEROL SULFATE HFA 108 (90 BASE) MCG/ACT IN AERS: 1.0000 | INHALATION_SPRAY | Freq: Four times a day (QID) | RESPIRATORY_TRACT | Status: DC | PRN

## 2015-02-09 MED ORDER — LORAZEPAM 0.5 MG PO TABS: 0.5000 mg | ORAL_TABLET | Freq: Two times a day (BID) | ORAL | Status: DC | PRN

## 2015-02-09 MED ORDER — ONDANSETRON HCL 4 MG PO TABS
4.0000 mg | ORAL_TABLET | Freq: Three times a day (TID) | ORAL | Status: DC | PRN
Start: 2015-02-09 — End: 2015-09-01

## 2015-02-09 MED ORDER — METHOCARBAMOL 500 MG PO TABS: 500.0000 mg | ORAL_TABLET | Freq: Three times a day (TID) | ORAL | Status: DC | PRN

## 2015-02-09 NOTE — Telephone Encounter (Signed)
I sent in the albuterol and zofran.  Advise on other request, not sure what anti-diarrhea med. She is referring to.

## 2015-02-09 NOTE — Telephone Encounter (Signed)
Pharmacy: [facility] [employer] on [address]  Reason for call: Pt needing refill on 5 meds: zofran, robaxin, ativan, albuterol inhaler, and anti-diarrhea medicine (did not know the name). [facility] now. Pt is out of 3 meds.

## 2015-02-09 NOTE — Telephone Encounter (Signed)
Not sure, I have not prescribed an antidiarrheal med for her. Has she taken Lomotil in past. I can prescribe this if it is something else she has to let us know what it was and I can prescribe

## 2015-02-10 NOTE — Telephone Encounter (Signed)
[facility] [address]

## 2015-05-28 ENCOUNTER — Ambulatory Visit (HOSPITAL_BASED_OUTPATIENT_CLINIC_OR_DEPARTMENT_OTHER): Payer: BLUE CROSS/BLUE SHIELD | Admitting: Family

## 2015-05-28 ENCOUNTER — Encounter: Payer: Self-pay | Admitting: Family

## 2015-05-28 ENCOUNTER — Other Ambulatory Visit (HOSPITAL_BASED_OUTPATIENT_CLINIC_OR_DEPARTMENT_OTHER): Payer: BLUE CROSS/BLUE SHIELD

## 2015-05-28 VITALS — BP 132/83 | HR 58 | Temp 98.3°F | Resp 16 | Ht 60.0 in | Wt 120.0 lb

## 2015-05-28 DIAGNOSIS — C50112 Malignant neoplasm of central portion of left female breast: Secondary | ICD-10-CM

## 2015-05-28 DIAGNOSIS — C50012 Malignant neoplasm of nipple and areola, left female breast: Secondary | ICD-10-CM

## 2015-05-28 DIAGNOSIS — Z853 Personal history of malignant neoplasm of breast: Secondary | ICD-10-CM

## 2015-05-28 DIAGNOSIS — C50912 Malignant neoplasm of unspecified site of left female breast: Secondary | ICD-10-CM | POA: Diagnosis not present

## 2015-05-28 DIAGNOSIS — E559 Vitamin D deficiency, unspecified: Secondary | ICD-10-CM

## 2015-05-28 LAB — COMPREHENSIVE METABOLIC PANEL (CC13)
ALK PHOS: 47 IU/L (ref 39–117)
ALT: 11 IU/L (ref 0–32)
AST: 20 IU/L (ref 0–40)
Albumin, Serum: 4 g/dL (ref 3.5–5.5)
Albumin/Globulin Ratio: 1.1 — ABNORMAL LOW (ref 1.2–2.2)
BUN/Creatinine Ratio: 18 (ref 9–23)
BUN: 10 mg/dL (ref 6–24)
Bilirubin Total: 0.5 mg/dL (ref 0.0–1.2)
CALCIUM: 9.1 mg/dL (ref 8.7–10.2)
CO2: 24 mmol/L (ref 18–29)
CREATININE: 0.57 mg/dL (ref 0.57–1.00)
Chloride, Ser: 101 mmol/L (ref 96–106)
GFR calc Af Amer: 128 mL/min/{1.73_m2} (ref >59)
GFR, EST NON AFRICAN AMERICAN: 111 mL/min/{1.73_m2} (ref >59)
GLUCOSE: 85 mg/dL (ref 65–99)
Globulin, Total: 3.6 g/dL (ref 1.5–4.5)
Potassium, Ser: 3.8 mmol/L (ref 3.5–5.2)
Sodium: 133 mmol/L — ABNORMAL LOW (ref 134–144)
Total Protein: 7.6 g/dL (ref 6.0–8.5)

## 2015-05-28 LAB — CBC WITH DIFFERENTIAL (CANCER CENTER ONLY)
BASO#: 0 10*3/uL (ref 0.0–0.2)
BASO%: 0.4 % (ref 0.0–2.0)
EOS ABS: 0.2 10*3/uL (ref 0.0–0.5)
EOS%: 3.2 % (ref 0.0–7.0)
HEMATOCRIT: 39.4 % (ref 34.8–46.6)
HEMOGLOBIN: 13.4 g/dL (ref 11.6–15.9)
LYMPH#: 1.8 10*3/uL (ref 0.9–3.3)
LYMPH%: 31.1 % (ref 14.0–48.0)
MCH: 32.1 pg (ref 26.0–34.0)
MCHC: 34 g/dL (ref 32.0–36.0)
MCV: 94 fL (ref 81–101)
MONO#: 0.6 10*3/uL (ref 0.1–0.9)
MONO%: 11.1 % (ref 0.0–13.0)
NEUT%: 54.2 % (ref 39.6–80.0)
NEUTROS ABS: 3.1 10*3/uL (ref 1.5–6.5)
Platelets: 189 10*3/uL (ref 145–400)
RBC: 4.18 10*6/uL (ref 3.70–5.32)
RDW: 12.1 % (ref 11.1–15.7)
WBC: 5.7 10*3/uL (ref 3.9–10.0)

## 2015-05-28 NOTE — Progress Notes (Signed)
Hematology and Oncology Follow Up Visit  Wanette Jividen [id number] September 10, 1965 50 y.o. 05/28/2015   Principle Diagnosis:  Stage IIIC (T2N3) grade 3 IDC of the left breast - Triple negative, BRCA negative S/p left mastectomy August, 2007  Current Therapy:   Observation    Interim History:  Ms. Cubillos is here today for a follow-up. She is doing well and has no complaints at this time. Her CA 27-29 in October was 14.  She has tenderness across her mastectomy sight and in the left axilla from nerve damage and neuropathy. This is unchanged.   Her mammogram in September was negative.  No fever, chills, n/v, cough, rash, dizziness, SOB, chest pain, palpitations, abdominal pain or changes in bowel or bladder habits.  No lymphadenopathy found on exam. No episodes of bleeding, bruising or petechiae.  No swelling, tenderness, numbness or tingling in her extremities. No c/o joint aches or pains.  She has maintained a good appetite and drinks 2 gallons of water a day. Her weight is stable.   Medications:    Medication List       This list is accurate as of: 05/28/15  2:19 PM.  Always use your most recent med list.               albuterol 108 (90 Base) MCG/ACT inhaler  Commonly known as:  PROVENTIL HFA;VENTOLIN HFA  Inhale 1 puff into the lungs every 6 (six) hours as needed for wheezing or shortness of breath.     b complex vitamins tablet  Take 1 tablet by mouth daily.     calcium carbonate 600 MG Tabs tablet  Commonly known as:  OS-CAL  Take 1,800 mg by mouth 2 (two) times daily with a meal.     cetirizine 10 MG tablet  Commonly known as:  ZYRTEC  Take 10 mg by mouth daily.     diphenhydrAMINE 25 MG tablet  Commonly known as:  BENADRYL  Take 25 mg by mouth every 6 (six) hours as needed.     doxepin 10 MG capsule  Commonly known as:  SINEQUAN  Take 10 mg by mouth.     ergocalciferol 50000 units capsule  Commonly known as:  VITAMIN D2  Take 1 capsule (50,000 Units total) by mouth  once a week.     hydroquinone 4 % cream  Apply topically daily.     LORazepam 0.5 MG tablet  Commonly known as:  ATIVAN  Take 1 tablet (0.5 mg total) by mouth 2 (two) times daily as needed for anxiety.     methocarbamol 500 MG tablet  Commonly known as:  ROBAXIN  Take 1 tablet (500 mg total) by mouth every 8 (eight) hours as needed for muscle spasms.     multivitamin tablet  Take 1 tablet by mouth daily.     ondansetron 4 MG tablet  Commonly known as:  ZOFRAN  Take 1 tablet (4 mg total) by mouth every 8 (eight) hours as needed for nausea or vomiting.     triamcinolone cream 0.1 %  Commonly known as:  KENALOG  Apply 1 application topically 2 (two) times daily.     vitamin C 1000 MG tablet  Take 1,000 mg by mouth daily.        Allergies:  Allergies  Allergen Reactions  . Sulfa Antibiotics Anaphylaxis    Past Medical History, Surgical history, Social history, and Family History were reviewed and updated.  Review of Systems: All other 10 point review of systems is  negative.   Physical Exam:  height is 5' (1.524 m) and weight is 120 lb (54.432 kg). Her oral temperature is 98.3 F (36.8 C). Her blood pressure is 132/83 and her pulse is 58. Her respiration is 16.   Wt Readings from Last 3 Encounters:  05/28/15 120 lb (54.432 kg)  01/01/15 121 lb 9.6 oz (55.157 kg)  11/20/14 119 lb (53.978 kg)    Ocular: Sclerae unicteric, pupils equal, round and reactive to light Ear-nose-throat: Oropharynx clear, dentition fair Lymphatic: No cervical supraclavicular or axillary adenopathy Lungs no rales or rhonchi, good excursion bilaterally Heart regular rate and rhythm, no murmur appreciated Abd soft, nontender, positive bowel sounds MSK no focal spinal tenderness, no joint edema Neuro: non-focal, well-oriented, appropriate affect Breasts: Left breast mastectomy. No changes with the right breast. No mass, lesion, rash or lymphadenopathy found on exam.   Lab Results  Component  Value Date   WBC 5.7 05/28/2015   HGB 13.4 05/28/2015   HCT 39.4 05/28/2015   MCV 94 05/28/2015   PLT 189 05/28/2015   No results found for: FERRITIN, IRON, TIBC, UIBC, IRONPCTSAT Lab Results  Component Value Date   RBC 4.18 05/28/2015   No results found for: KPAFRELGTCHN, LAMBDASER, KAPLAMBRATIO No results found for: Kandis Cocking, IGMSERUM No results found for: [name], SPEI   Chemistry      Component Value Date/Time   NA 141 11/20/2014 1407   NA 140 09/04/2014 1539   K 3.8 11/20/2014 1407   K 3.7 09/04/2014 1539   CL 104 09/04/2014 1539   CO2 27 11/20/2014 1407   CO2 29 09/04/2014 1539   BUN 13.2 11/20/2014 1407   BUN 10 09/04/2014 1539   CREATININE 0.7 11/20/2014 1407   CREATININE 0.70 09/04/2014 1539      Component Value Date/Time   CALCIUM 9.0 11/20/2014 1407   CALCIUM 8.8 09/04/2014 1539   ALKPHOS 52 11/20/2014 1407   ALKPHOS 49 09/04/2014 1539   AST 21 11/20/2014 1407   AST 20 09/04/2014 1539   ALT 14 11/20/2014 1407   ALT 12 09/04/2014 1539   BILITOT 0.31 11/20/2014 1407   BILITOT 0.5 09/04/2014 1539     Impression and Plan: Ms. Sprenkle is a very pleasant 50 yo female with history of stage IIIC (T2N3) grade 3 IDC of the left breast diagnosed in 2007. She was triple negative and also BRCA negative. She had a left mastectomy in August 2007 followed by adjuvant chemotherapy with Adriamycin, Cytoxan and Taxol (September 2007 - January 2008). She also received radiation to the chest ( February 2008 - March 2008). So far, she has done well and there has been no evidence of recurrence. She is asymptomatic at this time. Her CA 27.29 has been stable. In October her level was 14. Today's level is pending.  She is taking vitamin D weekly as prescribed.  We will plan to [place] her back in 6 months for labs and follow-up.  She will contact us with any questions or concerns. We can certainly Alig her sooner if need be.     Eliezer Bottom, NP 4/27/20172:19 PM

## 2015-05-29 ENCOUNTER — Telehealth: Payer: Self-pay | Admitting: Nurse Practitioner

## 2015-05-29 LAB — LACTATE DEHYDROGENASE: LDH: 168 U/L (ref 125–245)

## 2015-05-29 LAB — CANCER ANTIGEN 27.29: CAN 27.29: 11.8 U/mL (ref 0.0–38.6)

## 2015-05-29 LAB — CANCER ANTIGEN 27-29 (PARALLEL TESTING): CA 27.29: 21 U/mL (ref <38)

## 2015-05-29 NOTE — Telephone Encounter (Addendum)
LVM on pt's personal machine. ----- Message from Eliezer Bottom, NP sent at 05/29/2015  8:55 AM EDT ----- Regarding: CA 27.29 Her Ca 27.29 was good!!! Thank you!  [name]  ----- Message -----    From: [facility]    Sent: 05/28/2015   2:01 PM      To: Eliezer Bottom, NP

## 2015-06-04 DIAGNOSIS — L720 Epidermal cyst: Secondary | ICD-10-CM | POA: Diagnosis not present

## 2015-06-04 DIAGNOSIS — L811 Chloasma: Secondary | ICD-10-CM | POA: Diagnosis not present

## 2015-06-04 DIAGNOSIS — L659 Nonscarring hair loss, unspecified: Secondary | ICD-10-CM | POA: Diagnosis not present

## 2015-06-04 DIAGNOSIS — L282 Other prurigo: Secondary | ICD-10-CM | POA: Diagnosis not present

## 2015-06-30 ENCOUNTER — Ambulatory Visit (INDEPENDENT_AMBULATORY_CARE_PROVIDER_SITE_OTHER): Payer: BLUE CROSS/BLUE SHIELD | Admitting: Medical

## 2015-06-30 ENCOUNTER — Ambulatory Visit (HOSPITAL_BASED_OUTPATIENT_CLINIC_OR_DEPARTMENT_OTHER)
Admission: RE | Admit: 2015-06-30 | Discharge: 2015-06-30 | Disposition: A | Discharge status: Home / Self Care | Payer: BLUE CROSS/BLUE SHIELD | Source: Ambulatory Visit | Attending: Medical | Admitting: Medical

## 2015-06-30 ENCOUNTER — Encounter: Payer: Self-pay | Admitting: Medical

## 2015-06-30 VITALS — BP 116/76 | HR 59 | Temp 98.6°F | Ht 60.0 in | Wt 118.6 lb

## 2015-06-30 DIAGNOSIS — S8992XA Unspecified injury of left lower leg, initial encounter: Secondary | ICD-10-CM | POA: Diagnosis not present

## 2015-06-30 DIAGNOSIS — M25562 Pain in left knee: Secondary | ICD-10-CM | POA: Diagnosis not present

## 2015-06-30 DIAGNOSIS — R51 Headache: Secondary | ICD-10-CM | POA: Diagnosis not present

## 2015-06-30 DIAGNOSIS — M25572 Pain in left ankle and joints of left foot: Secondary | ICD-10-CM

## 2015-06-30 DIAGNOSIS — C50012 Malignant neoplasm of nipple and areola, left female breast: Secondary | ICD-10-CM

## 2015-06-30 DIAGNOSIS — W1839XA Other fall on same level, initial encounter: Secondary | ICD-10-CM | POA: Diagnosis not present

## 2015-06-30 DIAGNOSIS — S0093XA Contusion of unspecified part of head, initial encounter: Secondary | ICD-10-CM

## 2015-06-30 DIAGNOSIS — S99922A Unspecified injury of left foot, initial encounter: Secondary | ICD-10-CM | POA: Diagnosis not present

## 2015-06-30 DIAGNOSIS — M79672 Pain in left foot: Secondary | ICD-10-CM | POA: Diagnosis not present

## 2015-06-30 DIAGNOSIS — S0990XA Unspecified injury of head, initial encounter: Secondary | ICD-10-CM | POA: Diagnosis not present

## 2015-06-30 MED ORDER — HYDROQUINONE 4 % EX CREA: TOPICAL_CREAM | Freq: Every day | CUTANEOUS | Status: DC

## 2015-06-30 MED ORDER — HYDROCODONE-ACETAMINOPHEN 5-325 MG PO TABS: 1.0000 | ORAL_TABLET | Freq: Four times a day (QID) | ORAL | Status: DC | PRN

## 2015-06-30 NOTE — Patient Instructions (Addendum)
For your foot pain, knee pain and head contusion will get x-rays.   Also ibuprofen for mild pain. For moderate to severe pain will rx norco  We will let you know results of xrays. For small abrasion use neosporin.  Follow up in 7 days or as needed

## 2015-06-30 NOTE — Progress Notes (Signed)
Subjective:    Patient ID: Gail Levine, female    DOB: 04-Feb-1965, 50 y.o.   MRN: [id number]  HPI  Pt states she was at beach this weekend. She fell on beach. But this was on hard portion of sand. Some fell on compacted sand.   She fell and hit her left foot, left ankle,left knee, left elbow and her head.  No loc. No ha, no nausea, no vomiting, no dizziness and no gross motor/sensory function deficits.  LMP- one week ago.  Pt has level 5-6/10 pain foot and knee.    Review of Systems  Constitutional: Negative for fever, chills and fatigue.  Respiratory: Negative for cough, choking, shortness of breath and wheezing.   Cardiovascular: Negative for chest pain and palpitations.  Musculoskeletal: Negative for back pain and gait problem.       Lt knee, lt foot and head pain. (top of skull frontal area).  Skin: Negative for rash.  Neurological: Negative for dizziness, syncope, weakness, numbness and headaches.  Hematological: Negative for adenopathy. Does not bruise/bleed easily.  Psychiatric/Behavioral: Negative for behavioral problems and confusion.    Past Medical History  Diagnosis Date  . Asthma   . Vitamin D deficiency   . Allergy   . Positive PPD   . Pruritus 09/04/2014  . HIV infection ([place])   . Cancer ([place])     2007  . Hyperlipidemia   . Thyroid disease   . Breast cancer, left breast ([place])     2007   . Estrogen deficient vulvovaginitis 09/14/2014  . Cervical cancer screening 01/01/2015  . Dermatitis 01/04/2015     Social History   Social History  . Marital Status: Single    Spouse Name: N/A  . Number of Children: N/A  . Years of Education: N/A   Occupational History  . patient care coordinator    Social History Main Topics  . Smoking status: Never Smoker   . Smokeless tobacco: Not on file  . Alcohol Use: 0.0 oz/week    0 Standard drinks or equivalent per week  . Drug Use: No  . Sexual Activity: Not on file   Other Topics Concern  . Not on file   Social  History Narrative    Past Surgical History  Procedure Laterality Date  . Mastectomy      left breast    Family History  Problem Relation Age of Onset  . Cancer Mother     cancer  . Hypertension Father   . Stroke Father   . Heart disease Father   . Diabetes Father     Allergies  Allergen Reactions  . Sulfa Antibiotics Anaphylaxis    Current Outpatient Prescriptions on File Prior to Visit  Medication Sig Dispense Refill  . albuterol (PROVENTIL HFA;VENTOLIN HFA) 108 (90 Base) MCG/ACT inhaler Inhale 1 puff into the lungs every 6 (six) hours as needed for wheezing or shortness of breath. 1 Inhaler 6  . Ascorbic Acid (VITAMIN C) 1000 MG tablet Take 1,000 mg by mouth daily.    Marland Kitchen b complex vitamins tablet Take 1 tablet by mouth daily.    . calcium carbonate (OS-CAL) 600 MG TABS tablet Take 1,800 mg by mouth 2 (two) times daily with a meal.    . cetirizine (ZYRTEC) 10 MG tablet Take 10 mg by mouth daily.    . diphenhydrAMINE (BENADRYL) 25 MG tablet Take 25 mg by mouth every 6 (six) hours as needed.    . doxepin (SINEQUAN) 10 MG capsule Take  10 mg by mouth.    . ergocalciferol (VITAMIN D2) 50000 UNITS capsule Take 1 capsule (50,000 Units total) by mouth once a week. 30 capsule 4  . LORazepam (ATIVAN) 0.5 MG tablet Take 1 tablet (0.5 mg total) by mouth 2 (two) times daily as needed for anxiety. 10 tablet 1  . methocarbamol (ROBAXIN) 500 MG tablet Take 1 tablet (500 mg total) by mouth every 8 (eight) hours as needed for muscle spasms. 30 tablet 1  . Multiple Vitamin (MULTIVITAMIN) tablet Take 1 tablet by mouth daily.    . ondansetron (ZOFRAN) 4 MG tablet Take 1 tablet (4 mg total) by mouth every 8 (eight) hours as needed for nausea or vomiting. 20 tablet 1  . triamcinolone cream (KENALOG) 0.1 % Apply 1 application topically 2 (two) times daily. 80 g 2   No current facility-administered medications on file prior to visit.    BP 116/76 mmHg  Pulse 59  Temp(Src) 98.6 F (37 C) (Oral)   Ht 5' (1.524 m)  Wt 118 lb 9.6 oz (53.797 kg)  BMI 23.16 kg/m2  SpO2 100%  LMP 06/23/2015       Objective:   Physical Exam  General Mental Status- Alert. General Appearance- Not in acute distress.   Skin General: Color- Normal Color. Moisture- Normal Moisture.  Neck Carotid Arteries- Normal color. Moisture- Normal Moisture. No carotid bruits. No JVD.  Chest and Lung Exam Auscultation: Breath Sounds:-Normal.  Cardiovascular Auscultation:Rythm- Regular. Murmurs & Other Heart Sounds:Auscultation of the heart reveals- No Murmurs.    Neurologic Cranial Nerve exam:- CN III-XII intact(No nystagmus), symmetric smile. Drift Test:- No drift. Romberg Exam:- Negative.  Finger to Nose:- Normal/Intact Strength:- 5/5 equal and symmetric strength both upper and lower extremities.  Skull- on top aspect in frontal area tender to palpation. No step off. No crepitus. No bruing.  Lt shoulder- no pain on palpation or range of motion. Lt elbow- no pain on rom. Faint abrasion Lt foot- mild pain to palpation. And small abrasion distal first metatarsal. Lt knee- mild abrasion over patella and mild pain on range of motion.      Assessment & Plan:  For your foot pain, knee pain and head contusion will get x-rays.  Ibuprofen for mild pain. For moderate to severe pain will rx norco  We will let you know results of xrays. For small abrasion use neosporin.  Follow up in 7 days or as needed

## 2015-06-30 NOTE — Progress Notes (Signed)
Pre visit review using our clinic review tool, if applicable. No additional management support is needed unless otherwise documented below in the visit note. 

## 2015-07-07 ENCOUNTER — Ambulatory Visit (HOSPITAL_BASED_OUTPATIENT_CLINIC_OR_DEPARTMENT_OTHER)
Admission: RE | Admit: 2015-07-07 | Discharge: 2015-07-07 | Disposition: A | Discharge status: Home / Self Care | Payer: BLUE CROSS/BLUE SHIELD | Source: Ambulatory Visit | Attending: Medical | Admitting: Medical

## 2015-07-07 ENCOUNTER — Ambulatory Visit (INDEPENDENT_AMBULATORY_CARE_PROVIDER_SITE_OTHER): Payer: BLUE CROSS/BLUE SHIELD | Admitting: Medical

## 2015-07-07 ENCOUNTER — Encounter: Payer: Self-pay | Admitting: Medical

## 2015-07-07 VITALS — BP 119/89 | HR 72 | Temp 98.2°F | Ht 60.0 in | Wt 120.8 lb

## 2015-07-07 DIAGNOSIS — M79672 Pain in left foot: Secondary | ICD-10-CM | POA: Diagnosis not present

## 2015-07-07 DIAGNOSIS — M25572 Pain in left ankle and joints of left foot: Secondary | ICD-10-CM

## 2015-07-07 DIAGNOSIS — R519 Headache, unspecified: Secondary | ICD-10-CM

## 2015-07-07 DIAGNOSIS — S99922A Unspecified injury of left foot, initial encounter: Secondary | ICD-10-CM | POA: Diagnosis not present

## 2015-07-07 DIAGNOSIS — S0990XA Unspecified injury of head, initial encounter: Secondary | ICD-10-CM | POA: Diagnosis not present

## 2015-07-07 DIAGNOSIS — R51 Headache: Secondary | ICD-10-CM | POA: Diagnosis not present

## 2015-07-07 DIAGNOSIS — S0993XA Unspecified injury of face, initial encounter: Secondary | ICD-10-CM | POA: Diagnosis not present

## 2015-07-07 MED ORDER — HYDROCODONE-ACETAMINOPHEN 5-325 MG PO TABS
1.0000 | ORAL_TABLET | Freq: Four times a day (QID) | ORAL | Status: DC | PRN
Start: 2015-07-07 — End: 2017-08-11

## 2015-07-07 MED ORDER — MELOXICAM 7.5 MG PO TABS: ORAL_TABLET | ORAL | Status: DC

## 2015-07-07 NOTE — Patient Instructions (Addendum)
For your foot pain I am writing meloxicam. You can use this 1-2 times daily if needed. Stop ibuprofen. I am again writing norco but low number. I don't anticipate you will need this much longer. In fact sometimes meds can cause rebound HA.  Will put in order to facial bone ct and head ct without contrast.  Will repeat xray of the foot today. Since you still have pain. It is possible initial xray may not have picked up very small fracture.  Follow up in 2 weeks or as needed

## 2015-07-07 NOTE — Progress Notes (Signed)
Subjective:    Patient ID: Gail Levine, female    DOB: 1965/07/03, 50 y.o.   MRN: [id number]  HPI   Pt in for evaluation. She had various area of pain after fall at the beach. Areas of pain were foot pain, knee pain and she had head contusion. xrays were all negative.   Pt has new complaint of faint occasional left maxillary area pain. Also left foot pain persists.  Pt is feeling some mild-moderate ha since the fall. She describes in top aspect where she hit. Pt state level 3-4 pain/ha. Last visit was level 5-6/10.  With ha no nausea, no vomiting, and no light sensitivity. No gross motor or sensory function deficits. HA now more than a week.  Pt expresses that I may not have given enough norco.      Review of Systems  Constitutional: Negative for fever, chills and fatigue.  Respiratory: Negative for cough and chest tightness.   Musculoskeletal: Negative for back pain.       Lt foot pain. Lt maxillary/zygomatic region pain on palpation.  Neurological: Positive for headaches. Negative for dizziness and light-headedness.  Hematological: Negative for adenopathy. Does not bruise/bleed easily.  Psychiatric/Behavioral: Negative for behavioral problems and decreased concentration.   Past Medical History  Diagnosis Date  . Asthma   . Vitamin D deficiency   . Allergy   . Positive PPD   . Pruritus 09/04/2014  . HIV infection ([place])   . Cancer ([place])     2007  . Hyperlipidemia   . Thyroid disease   . Breast cancer, left breast ([place])     2007   . Estrogen deficient vulvovaginitis 09/14/2014  . Cervical cancer screening 01/01/2015  . Dermatitis 01/04/2015     Social History   Social History  . Marital Status: Single    Spouse Name: N/A  . Number of Children: N/A  . Years of Education: N/A   Occupational History  . patient care coordinator    Social History Main Topics  . Smoking status: Never Smoker   . Smokeless tobacco: Not on file  . Alcohol Use: 0.0 oz/week    0 Standard  drinks or equivalent per week  . Drug Use: No  . Sexual Activity: Not on file   Other Topics Concern  . Not on file   Social History Narrative    Past Surgical History  Procedure Laterality Date  . Mastectomy      left breast    Family History  Problem Relation Age of Onset  . Cancer Mother     cancer  . Hypertension Father   . Stroke Father   . Heart disease Father   . Diabetes Father     Allergies  Allergen Reactions  . Sulfa Antibiotics Anaphylaxis    Current Outpatient Prescriptions on File Prior to Visit  Medication Sig Dispense Refill  . albuterol (PROVENTIL HFA;VENTOLIN HFA) 108 (90 Base) MCG/ACT inhaler Inhale 1 puff into the lungs every 6 (six) hours as needed for wheezing or shortness of breath. 1 Inhaler 6  . Ascorbic Acid (VITAMIN C) 1000 MG tablet Take 1,000 mg by mouth daily.    Marland Kitchen b complex vitamins tablet Take 1 tablet by mouth daily.    . calcium carbonate (OS-CAL) 600 MG TABS tablet Take 1,800 mg by mouth 2 (two) times daily with a meal.    . cetirizine (ZYRTEC) 10 MG tablet Take 10 mg by mouth daily.    . diphenhydrAMINE (BENADRYL) 25 MG tablet  Take 25 mg by mouth every 6 (six) hours as needed.    . doxepin (SINEQUAN) 10 MG capsule Take 10 mg by mouth.    . ergocalciferol (VITAMIN D2) 50000 UNITS capsule Take 1 capsule (50,000 Units total) by mouth once a week. 30 capsule 4  . HYDROcodone-acetaminophen (NORCO) 5-325 MG tablet Take 1 tablet by mouth every 6 (six) hours as needed for moderate pain. 8 tablet 0  . hydroquinone 4 % cream Apply topically daily. 28.35 g 1  . LORazepam (ATIVAN) 0.5 MG tablet Take 1 tablet (0.5 mg total) by mouth 2 (two) times daily as needed for anxiety. 10 tablet 1  . methocarbamol (ROBAXIN) 500 MG tablet Take 1 tablet (500 mg total) by mouth every 8 (eight) hours as needed for muscle spasms. 30 tablet 1  . Multiple Vitamin (MULTIVITAMIN) tablet Take 1 tablet by mouth daily.    . ondansetron (ZOFRAN) 4 MG tablet Take 1 tablet  (4 mg total) by mouth every 8 (eight) hours as needed for nausea or vomiting. 20 tablet 1  . triamcinolone cream (KENALOG) 0.1 % Apply 1 application topically 2 (two) times daily. 80 g 2   No current facility-administered medications on file prior to visit.    BP 119/89 mmHg  Pulse 72  Temp(Src) 98.2 F (36.8 C) (Oral)  Ht 5' (1.524 m)  Wt 120 lb 12.8 oz (54.795 kg)  BMI 23.59 kg/m2  SpO2 99%  LMP 06/23/2015       Objective:   Physical Exam  General Mental Status- Alert. General Appearance- Not in acute distress.   Skin General: Color- Normal Color. Moisture- Normal Moisture.  Neck Carotid Arteries- Normal color. Moisture- Normal Moisture. No carotid bruits. No JVD.  Chest and Lung Exam Auscultation: Breath Sounds:-Normal.  Cardiovascular Auscultation:Rythm- Regular. Murmurs & Other Heart Sounds:Auscultation of the heart reveals- No Murmurs.  Abdomen Inspection:-Inspeection Normal. Palpation/Percussion:Note:No mass. Palpation and Percussion of the abdomen reveal- Non Tender, Non Distended + BS, no rebound or guarding.   Neurologic Cranial Nerve exam:- CN III-XII intact(No nystagmus), symmetric smile. Finger to Nose:- Normal/Intact Strength:- 5/5 equal and symmetric strength both upper and lower extremities.  Lt foot- distal first metatarsal pain on palpation.  Face- left maxillary region very tender to palpation.  Lt knee- from, no pain.      Assessment & Plan:  For your foot pain I am writing meloxicam. You can use this 1-2 times daily if needed. Stop ibuprofen. I am again writing norco but low number. I don't anticipate you will need this much longer. In fact sometimes meds can cause rebound HA.  Will put in order to facial bone ct and head ct without contrast.  Will repeat xray of the foot today. Since you still have pain. It is possible initial xray may not have picked up very small fracture.  Follow up in 2 weeks or as needed

## 2015-07-07 NOTE — Progress Notes (Signed)
Pre visit review using our clinic tool,if applicable. No additional management support is needed unless otherwise documented below in the visit note.  

## 2015-08-28 ENCOUNTER — Encounter: Payer: Self-pay | Admitting: Medical

## 2015-08-28 ENCOUNTER — Ambulatory Visit (INDEPENDENT_AMBULATORY_CARE_PROVIDER_SITE_OTHER): Payer: BLUE CROSS/BLUE SHIELD | Admitting: Medical

## 2015-08-28 VITALS — BP 94/66 | HR 66 | Temp 98.1°F | Ht 60.0 in | Wt 119.8 lb

## 2015-08-28 DIAGNOSIS — J029 Acute pharyngitis, unspecified: Secondary | ICD-10-CM

## 2015-08-28 DIAGNOSIS — R059 Cough, unspecified: Secondary | ICD-10-CM

## 2015-08-28 DIAGNOSIS — R05 Cough: Secondary | ICD-10-CM

## 2015-08-28 MED ORDER — BENZONATATE 100 MG PO CAPS: 100.0000 mg | ORAL_CAPSULE | Freq: Three times a day (TID) | ORAL | 0 refills | Status: DC | PRN

## 2015-08-28 MED ORDER — CEPHALEXIN 500 MG PO CAPS: 500.0000 mg | ORAL_CAPSULE | Freq: Two times a day (BID) | ORAL | 0 refills | Status: DC

## 2015-08-28 NOTE — Patient Instructions (Addendum)
Your recent moderate to severe st, enlarged lymph nodes and high level pain supports dx of strep. You decline rapid strep test. Clinically suspicious enough to treat without rapid test results. Will rx keflex antibiotic.   For cough rx benzonatate.(Note in event lung infection present, keflex has decent lung coverage as well)  For fever tylenol.  Follow up in 7 days or as needed

## 2015-08-28 NOTE — Progress Notes (Signed)
Pre visit review using our clinic review tool, if applicable. No additional management support is needed unless otherwise documented below in the visit note. 

## 2015-08-28 NOTE — Progress Notes (Addendum)
Subjective:    Patient ID: Gail Levine, female    DOB: [birth year], 50 y.o.   MRN: [id number]  HPI  Pt in with st for 3 days. Pain on swallowing and eating. DC from tonsils. When coughs get  yellowish, green mucous  with bad smell. Pt has some submandilar area pain with this. No body aches. Some mild chills and fever that comes and goes. Pt sister works at hospital  Review of Systems  Constitutional: Positive for chills and fever. Negative for fatigue.  HENT: Positive for sore throat. Negative for congestion, ear pain, nosebleeds, postnasal drip, sinus pressure, sneezing, trouble swallowing and voice change.   Respiratory: Positive for cough. Negative for chest tightness, shortness of breath and wheezing.   Cardiovascular: Negative for chest pain and palpitations.  Gastrointestinal: Negative for abdominal pain.  Genitourinary: Negative for dysuria.  Musculoskeletal: Negative for back pain.  Skin: Negative for rash.  Neurological: Negative for dizziness and headaches.  Hematological: Positive for adenopathy. Does not bruise/bleed easily.       Faint enlarged submandibular nodes.  Psychiatric/Behavioral: The patient is not nervous/anxious.    Past Medical History:  Diagnosis Date  . Allergy   . Asthma   . Breast cancer, left breast ([place])    2007   . Cancer ([place])    2007  . Cervical cancer screening 01/01/2015  . Dermatitis 01/04/2015  . Estrogen deficient vulvovaginitis 09/14/2014  . HIV infection ([place])   . Hyperlipidemia   . Positive PPD   . Pruritus 09/04/2014  . Thyroid disease   . Vitamin D deficiency      Social History   Social History  . Marital status: Single    Spouse name: N/A  . Number of children: N/A  . Years of education: N/A   Occupational History  . patient care coordinator    Social History Main Topics  . Smoking status: Never Smoker  . Smokeless tobacco: Not on file  . Alcohol use 0.0 oz/week  . Drug use: No  . Sexual activity: Not on file   Other  Topics Concern  . Not on file   Social History Narrative  . No narrative on file    Past Surgical History:  Procedure Laterality Date  . MASTECTOMY     left breast    Family History  Problem Relation Age of Onset  . Cancer Mother     cancer  . Hypertension Father   . Stroke Father   . Heart disease Father   . Diabetes Father     Allergies  Allergen Reactions  . Sulfa Antibiotics Anaphylaxis    Current Outpatient Prescriptions on File Prior to Visit  Medication Sig Dispense Refill  . albuterol (PROVENTIL HFA;VENTOLIN HFA) 108 (90 Base) MCG/ACT inhaler Inhale 1 puff into the lungs every 6 (six) hours as needed for wheezing or shortness of breath. 1 Inhaler 6  . Ascorbic Acid (VITAMIN C) 1000 MG tablet Take 1,000 mg by mouth daily.    Marland Kitchen b complex vitamins tablet Take 1 tablet by mouth daily.    . calcium carbonate (OS-CAL) 600 MG TABS tablet Take 1,800 mg by mouth 2 (two) times daily with a meal.    . cetirizine (ZYRTEC) 10 MG tablet Take 10 mg by mouth daily.    . diphenhydrAMINE (BENADRYL) 25 MG tablet Take 25 mg by mouth every 6 (six) hours as needed.    . doxepin (SINEQUAN) 10 MG capsule Take 10 mg by mouth.    Marland Kitchen  ergocalciferol (VITAMIN D2) 50000 UNITS capsule Take 1 capsule (50,000 Units total) by mouth once a week. 30 capsule 4  . HYDROcodone-acetaminophen (NORCO) 5-325 MG tablet Take 1 tablet by mouth every 6 (six) hours as needed for moderate pain. 8 tablet 0  . hydroquinone 4 % cream Apply topically daily. 28.35 g 1  . LORazepam (ATIVAN) 0.5 MG tablet Take 1 tablet (0.5 mg total) by mouth 2 (two) times daily as needed for anxiety. 10 tablet 1  . meloxicam (MOBIC) 7.5 MG tablet 1-2 tab po  q day 30 tablet 0  . methocarbamol (ROBAXIN) 500 MG tablet Take 1 tablet (500 mg total) by mouth every 8 (eight) hours as needed for muscle spasms. 30 tablet 1  . Multiple Vitamin (MULTIVITAMIN) tablet Take 1 tablet by mouth daily.    . ondansetron (ZOFRAN) 4 MG tablet Take 1 tablet  (4 mg total) by mouth every 8 (eight) hours as needed for nausea or vomiting. 20 tablet 1  . triamcinolone cream (KENALOG) 0.1 % Apply 1 application topically 2 (two) times daily. 80 g 2   No current facility-administered medications on file prior to visit.     BP 94/66 (BP Location: Right Arm, Patient Position: Sitting, Cuff Size: Normal)   Pulse 66   Temp 98.1 F (36.7 C) (Oral)   Ht 5' (1.524 m)   Wt 119 lb 12.8 oz (54.3 kg)   LMP 08/21/2015   SpO2 98%   BMI 23.40 kg/m       Objective:   Physical Exam  General  Mental Status - Alert. General Appearance - Well groomed. Not in acute distress.  Skin Rashes- No Rashes.  HEENT Head- Normal. Ear Auditory Canal - Left- Normal. Right - Normal.Tympanic Membrane- Left- Normal. Right- Normal. Eye Sclera/Conjunctiva- Left- Normal. Right- Normal. Nose & Sinuses Nasal Mucosa- Left-   No Boggy and Congested. Right- Not  Boggy and  Congested.Bilateral no  maxillary and  No frontal sinus pressure. Mouth & Throat Lips: Upper Lip- Normal: no dryness, cracking, pallor, cyanosis, or vesicular eruption. Lower Lip-Normal: no dryness, cracking, pallor, cyanosis or vesicular eruption. Buccal Mucosa- Bilateral- No Aphthous ulcers. Oropharynx- No Discharge or Erythema. Tonsils: Characteristics- Bilateral- moderate bright Erythema Size/Enlargement- Bilateral- No enlargement. Discharge- bilateral-None.  Neck Neck- Supple. No Masses. Faint submandibular node swollen.   Chest and Lung Exam Auscultation: Breath Sounds:-Clear even and unlabored.  Cardiovascular Auscultation:Rythm- Regular, rate and rhythm. Murmurs & Other Heart Sounds:Ausculatation of the heart reveal- No Murmurs.  Lymphatic Head & Neck General Head & Neck Lymphatics: Bilateral: Description- No Localized lymphadenopathy. Only sub nodes faint swollen.       Assessment & Plan:  Your recent moderate to severe st, enlarged lymph nodes and high level pain supports dx of  strep. You decline rapid strep test. Clinically suspicious enough to treat without rapid test results. Will rx keflex antibiotic.   For cough rx benzonatate.(Note in event lung infection present, keflex has decent lung coverage as well)  For fever tylenol.  Follow up in 7 days or as needed

## 2015-08-31 ENCOUNTER — Telehealth: Payer: Self-pay | Admitting: Family Medicine

## 2015-08-31 MED ORDER — AMOXICILLIN-POT CLAVULANATE 875-125 MG PO TABS: 1.0000 | ORAL_TABLET | Freq: Two times a day (BID) | ORAL | 0 refills | Status: DC

## 2015-08-31 NOTE — Telephone Encounter (Signed)
Notified pt and she voices understanding. 

## 2015-08-31 NOTE — Telephone Encounter (Signed)
Pt says that this new medication pt is allergic to, they would like to be advised of a different medication that she could take.    Please assist further.

## 2015-08-31 NOTE — Telephone Encounter (Signed)
D/C keflex, begin augmentin. If not improved in 24 hours should schedule follow up appointment in the office.

## 2015-08-31 NOTE — Telephone Encounter (Signed)
Please advise 

## 2015-08-31 NOTE — Telephone Encounter (Signed)
Pt called in because she says that PA started her on cephALEXin and medication isn't working. Pt would like to be advised on what to do next.       [id number]

## 2015-09-01 ENCOUNTER — Encounter: Payer: Self-pay | Admitting: Family Medicine

## 2015-09-01 ENCOUNTER — Telehealth: Payer: Self-pay | Admitting: Family Medicine

## 2015-09-01 ENCOUNTER — Ambulatory Visit (INDEPENDENT_AMBULATORY_CARE_PROVIDER_SITE_OTHER): Payer: BLUE CROSS/BLUE SHIELD | Admitting: Family Medicine

## 2015-09-01 VITALS — BP 110/80 | HR 60 | Temp 98.4°F | Ht 60.0 in | Wt 120.5 lb

## 2015-09-01 DIAGNOSIS — L299 Pruritus, unspecified: Secondary | ICD-10-CM

## 2015-09-01 DIAGNOSIS — R112 Nausea with vomiting, unspecified: Secondary | ICD-10-CM | POA: Diagnosis not present

## 2015-09-01 DIAGNOSIS — M62838 Other muscle spasm: Secondary | ICD-10-CM

## 2015-09-01 DIAGNOSIS — Z0000 Encounter for general adult medical examination without abnormal findings: Secondary | ICD-10-CM

## 2015-09-01 DIAGNOSIS — J209 Acute bronchitis, unspecified: Secondary | ICD-10-CM

## 2015-09-01 DIAGNOSIS — E785 Hyperlipidemia, unspecified: Secondary | ICD-10-CM

## 2015-09-01 DIAGNOSIS — R002 Palpitations: Secondary | ICD-10-CM

## 2015-09-01 MED ORDER — HYDROCODONE-HOMATROPINE 5-1.5 MG/5ML PO SYRP: 5.0000 mL | ORAL_SOLUTION | Freq: Three times a day (TID) | ORAL | 0 refills | Status: DC | PRN

## 2015-09-01 MED ORDER — AZITHROMYCIN 250 MG PO TABS: ORAL_TABLET | ORAL | 0 refills | Status: DC

## 2015-09-01 MED ORDER — ONDANSETRON HCL 4 MG PO TABS: 4.0000 mg | ORAL_TABLET | Freq: Three times a day (TID) | ORAL | 1 refills | Status: DC | PRN

## 2015-09-01 NOTE — Telephone Encounter (Signed)
Spoke with pt and she states that she is going to [place] Dr.  Charlett Blake this afternoon and she will talk with her about alternatives for the Cephalexin and Augmentin. I updated the allergy in the patients chart and Shirlean Mylar is aware that the pt is being seen this afternoon.

## 2015-09-01 NOTE — Telephone Encounter (Signed)
Relation to [id number] Call back [phone] Pharmacy:  Reason for call:  Patient inquiring when her last mamo was conducted and requesting mamo orders prior to CPE scheduled for 09/17/15. Please advise

## 2015-09-01 NOTE — Patient Instructions (Signed)
Elderberry liquid, [email or web address]  Acute Bronchitis Bronchitis is inflammation of the airways that extend from the windpipe into the lungs (bronchi). The inflammation often causes mucus to develop. This leads to a cough, which is the most common symptom of bronchitis.  In acute bronchitis, the condition usually develops suddenly and goes away over time, usually in a couple weeks. Smoking, allergies, and asthma can make bronchitis worse. Repeated episodes of bronchitis may cause further lung problems.  CAUSES Acute bronchitis is most often caused by the same virus that causes a cold. The virus can spread from person to person (contagious) through coughing, sneezing, and touching contaminated objects. SIGNS AND SYMPTOMS   Cough.   Fever.   Coughing up mucus.   Body aches.   Chest congestion.   Chills.   Shortness of breath.   Sore throat.  DIAGNOSIS  Acute bronchitis is usually diagnosed through a physical exam. Your health care provider will also ask you questions about your medical history. Tests, such as chest X-rays, are sometimes done to rule out other conditions.  TREATMENT  Acute bronchitis usually goes away in a couple weeks. Oftentimes, no medical treatment is necessary. Medicines are sometimes given for relief of fever or cough. Antibiotic medicines are usually not needed but may be prescribed in certain situations. In some cases, an inhaler may be recommended to help reduce shortness of breath and control the cough. A cool mist vaporizer may also be used to help thin bronchial secretions and make it easier to clear the chest.  HOME CARE INSTRUCTIONS  Get plenty of rest.   Drink enough fluids to keep your urine clear or pale yellow (unless you have a medical condition that requires fluid restriction). Increasing fluids may help thin your respiratory secretions (sputum) and reduce chest congestion, and it will prevent dehydration.   Take medicines only as  directed by your health care provider.  If you were prescribed an antibiotic medicine, finish it all even if you start to feel better.  Avoid smoking and secondhand smoke. Exposure to cigarette smoke or irritating chemicals will make bronchitis worse. If you are a smoker, consider using nicotine gum or skin patches to help control withdrawal symptoms. Quitting smoking will help your lungs heal faster.   Reduce the chances of another bout of acute bronchitis by washing your hands frequently, avoiding people with cold symptoms, and trying not to touch your hands to your mouth, nose, or eyes.   Keep all follow-up visits as directed by your health care provider.  SEEK MEDICAL CARE IF: Your symptoms do not improve after 1 week of treatment.  SEEK IMMEDIATE MEDICAL CARE IF:  You develop an increased fever or chills.   You have chest pain.   You have severe shortness of breath.  You have bloody sputum.   You develop dehydration.  You faint or repeatedly feel like you are going to pass out.  You develop repeated vomiting.  You develop a severe headache. MAKE SURE YOU:   Understand these instructions.  Will watch your condition.  Will get help right away if you are not doing well or get worse.   This information is not intended to replace advice given to you by your health care provider. Make sure you discuss any questions you have with your health care provider.   Document Released: 02/25/2004 Document Revised: 02/07/2014 Document Reviewed: 07/10/2012 Elsevier Interactive Patient Education [employer].

## 2015-09-01 NOTE — Telephone Encounter (Signed)
Our chart indicates sulfa allergy. This is not sulfa.  Does she have another allergy to augmentin and if so, what is reaction?  I think we should bring her back in for re-evaluation please.

## 2015-09-01 NOTE — Progress Notes (Signed)
Pre visit review using our clinic review tool, if applicable. No additional management support is needed unless otherwise documented below in the visit note. 

## 2015-09-02 ENCOUNTER — Other Ambulatory Visit: Payer: Self-pay | Admitting: Family Medicine

## 2015-09-02 DIAGNOSIS — Z1231 Encounter for screening mammogram for malignant neoplasm of breast: Secondary | ICD-10-CM

## 2015-09-02 NOTE — Telephone Encounter (Signed)
Pt has an appointment for a mammogram on 10/15/15. Pt has a follow up with Dr. Charlett Blake on 11/23/15. Pt was advised that she could complete her labs at the [facility] with pcp.

## 2015-09-03 ENCOUNTER — Ambulatory Visit: Payer: BLUE CROSS/BLUE SHIELD | Admitting: Family Medicine

## 2015-09-03 DIAGNOSIS — R079 Chest pain, unspecified: Secondary | ICD-10-CM | POA: Diagnosis not present

## 2015-09-03 DIAGNOSIS — R002 Palpitations: Secondary | ICD-10-CM | POA: Diagnosis not present

## 2015-09-03 DIAGNOSIS — R0602 Shortness of breath: Secondary | ICD-10-CM | POA: Diagnosis not present

## 2015-09-04 ENCOUNTER — Ambulatory Visit: Payer: BLUE CROSS/BLUE SHIELD | Admitting: Family Medicine

## 2015-09-09 ENCOUNTER — Encounter: Payer: Self-pay | Admitting: Family Medicine

## 2015-09-09 DIAGNOSIS — J209 Acute bronchitis, unspecified: Secondary | ICD-10-CM

## 2015-09-09 DIAGNOSIS — R002 Palpitations: Secondary | ICD-10-CM | POA: Insufficient documentation

## 2015-09-09 DIAGNOSIS — J45909 Unspecified asthma, uncomplicated: Secondary | ICD-10-CM | POA: Insufficient documentation

## 2015-09-09 DIAGNOSIS — M62838 Other muscle spasm: Secondary | ICD-10-CM | POA: Insufficient documentation

## 2015-09-09 HISTORY — DX: Acute bronchitis, unspecified: J20.9

## 2015-09-09 HISTORY — DX: Unspecified asthma, uncomplicated: J45.909

## 2015-09-09 NOTE — Assessment & Plan Note (Signed)
Encouraged heart healthy diet, increase exercise, avoid trans fats, consider a krill oil cap daily 

## 2015-09-09 NOTE — Assessment & Plan Note (Signed)
With hyperlipidemia, h/o breast cancer and family history of heart disease. Referred to cardiology for further consideration

## 2015-09-09 NOTE — Assessment & Plan Note (Signed)
Zantac and Zyrtec bid, witch hazel astringent and sarna lotion

## 2015-09-09 NOTE — Progress Notes (Signed)
Patient ID: Gail Levine, female   DOB: [birth year], 50 y.o.   MRN: [id number]   Subjective:    Patient ID: Gail Levine, female    DOB: 02-02-1965, 50 y.o.   MRN: [id number]  Chief Complaint  Patient presents with  . Cough    HPI Patient is in today for evaluation of respiratory symptoms. She has been struggling with head and chest congestion. Cough keeping her up at night. Productive of green sputum. Low grade fevers and chills, malaise and myalgias. Was struggling with vomiting yesterday, that has resolved but nausea persists. Continues to struggle with palpitations intermittently. Denies CP/HA or GU c/o. Taking meds as prescribed  Past Medical History:  Diagnosis Date  . Acute bronchitis 09/09/2015  . Allergy   . Asthma   . Breast cancer, left breast ([place])    2007   . Cancer ([place])    2007  . Cervical cancer screening 01/01/2015  . Dermatitis 01/04/2015  . Estrogen deficient vulvovaginitis 09/14/2014  . HIV infection ([place])   . Hyperlipidemia   . Positive PPD   . Pruritus 09/04/2014  . Thyroid disease   . Vitamin D deficiency     Past Surgical History:  Procedure Laterality Date  . MASTECTOMY     left breast    Family History  Problem Relation Age of Onset  . Cancer Mother     cancer  . Hypertension Father   . Stroke Father   . Heart disease Father   . Diabetes Father     Social History   Social History  . Marital status: Single    Spouse name: N/A  . Number of children: N/A  . Years of education: N/A   Occupational History  . patient care coordinator    Social History Main Topics  . Smoking status: Never Smoker  . Smokeless tobacco: Not on file  . Alcohol use 0.0 oz/week  . Drug use: No  . Sexual activity: Not on file   Other Topics Concern  . Not on file   Social History Narrative  . No narrative on file    Outpatient Medications Prior to Visit  Medication Sig Dispense Refill  . albuterol (PROVENTIL HFA;VENTOLIN HFA) 108 (90 Base) MCG/ACT inhaler Inhale 1  puff into the lungs every 6 (six) hours as needed for wheezing or shortness of breath. 1 Inhaler 6  . Ascorbic Acid (VITAMIN C) 1000 MG tablet Take 1,000 mg by mouth daily.    Marland Kitchen b complex vitamins tablet Take 1 tablet by mouth daily.    . benzonatate (TESSALON) 100 MG capsule Take 1 capsule (100 mg total) by mouth 3 (three) times daily as needed. 21 capsule 0  . calcium carbonate (OS-CAL) 600 MG TABS tablet Take 1,800 mg by mouth 2 (two) times daily with a meal.    . cetirizine (ZYRTEC) 10 MG tablet Take 10 mg by mouth daily.    . diphenhydrAMINE (BENADRYL) 25 MG tablet Take 25 mg by mouth every 6 (six) hours as needed.    . doxepin (SINEQUAN) 10 MG capsule Take 10 mg by mouth.    . ergocalciferol (VITAMIN D2) 50000 UNITS capsule Take 1 capsule (50,000 Units total) by mouth once a week. 30 capsule 4  . LORazepam (ATIVAN) 0.5 MG tablet Take 1 tablet (0.5 mg total) by mouth 2 (two) times daily as needed for anxiety. 10 tablet 1  . meloxicam (MOBIC) 7.5 MG tablet 1-2 tab po  q day 30 tablet 0  . methocarbamol (ROBAXIN)  500 MG tablet Take 1 tablet (500 mg total) by mouth every 8 (eight) hours as needed for muscle spasms. 30 tablet 1  . Multiple Vitamin (MULTIVITAMIN) tablet Take 1 tablet by mouth daily.    Marland Kitchen triamcinolone cream (KENALOG) 0.1 % Apply 1 application topically 2 (two) times daily. 80 g 2  . hydroquinone 4 % cream Apply topically daily. 28.35 g 1  . ondansetron (ZOFRAN) 4 MG tablet Take 1 tablet (4 mg total) by mouth every 8 (eight) hours as needed for nausea or vomiting. 20 tablet 1  . HYDROcodone-acetaminophen (NORCO) 5-325 MG tablet Take 1 tablet by mouth every 6 (six) hours as needed for moderate pain. (Patient not taking: Reported on 09/01/2015) 8 tablet 0  . amoxicillin-clavulanate (AUGMENTIN) 875-125 MG tablet Take 1 tablet by mouth 2 (two) times daily. 20 tablet 0   No facility-administered medications prior to visit.     Allergies  Allergen Reactions  . Sulfa Antibiotics  Anaphylaxis  . Penicillins Rash    Review of Systems  Constitutional: Negative for fever and malaise/fatigue.  HENT: Positive for congestion.   Eyes: Negative for blurred vision.  Respiratory: Positive for cough and sputum production. Negative for shortness of breath.   Cardiovascular: Positive for palpitations. Negative for chest pain and leg swelling.  Gastrointestinal: Positive for nausea and vomiting. Negative for abdominal pain and blood in stool.  Genitourinary: Negative for dysuria and frequency.  Musculoskeletal: Negative for falls.  Skin: Negative for rash.  Neurological: Negative for dizziness, loss of consciousness and headaches.  Endo/Heme/Allergies: Negative for environmental allergies.  Psychiatric/Behavioral: Negative for depression. The patient is not nervous/anxious.        Objective:    Physical Exam  Constitutional: She is oriented to person, place, and time. She appears well-developed and well-nourished. No distress.  HENT:  Head: Normocephalic and atraumatic.  Nose: Nose normal.  Eyes: Right eye exhibits no discharge. Left eye exhibits no discharge.  Neck: Normal range of motion. Neck supple.  Cardiovascular: Normal rate and regular rhythm.   No murmur heard. Pulmonary/Chest: Effort normal and breath sounds normal.  Abdominal: Soft. Bowel sounds are normal. There is no tenderness.  Musculoskeletal: She exhibits no edema.  Neurological: She is alert and oriented to person, place, and time.  Skin: Skin is warm and dry.  Psychiatric: She has a normal mood and affect.  Nursing note and vitals reviewed.   BP 110/80 (BP Location: Right Arm, Patient Position: Sitting, Cuff Size: Normal)   Pulse 60   Temp 98.4 F (36.9 C) (Oral)   Ht 5' (1.524 m)   Wt 120 lb 8 oz (54.7 kg)   LMP 08/21/2015   SpO2 98%   BMI 23.53 kg/m  Wt Readings from Last 3 Encounters:  09/01/15 120 lb 8 oz (54.7 kg)  08/28/15 119 lb 12.8 oz (54.3 kg)  07/07/15 120 lb 12.8 oz (54.8  kg)     Lab Results  Component Value Date   WBC 5.7 05/28/2015   HGB 13.4 05/28/2015   HCT 39.4 05/28/2015   PLT 189 05/28/2015   GLUCOSE 85 05/28/2015   CHOL 184 09/04/2014   TRIG 75.0 09/04/2014   HDL 66.50 09/04/2014   LDLCALC 102 (H) 09/04/2014   ALT 11 05/28/2015   AST 20 05/28/2015   NA 133 (L) 05/28/2015   K 3.8 05/28/2015   CL 101 05/28/2015   CREATININE 0.57 05/28/2015   BUN 10 05/28/2015   CO2 24 05/28/2015   TSH 2.72 09/04/2014  Lab Results  Component Value Date   TSH 2.72 09/04/2014   Lab Results  Component Value Date   WBC 5.7 05/28/2015   HGB 13.4 05/28/2015   HCT 39.4 05/28/2015   MCV 94 05/28/2015   PLT 189 05/28/2015   Lab Results  Component Value Date   NA 133 (L) 05/28/2015   K 3.8 05/28/2015   CHLORIDE 107 11/20/2014   CO2 24 05/28/2015   GLUCOSE 85 05/28/2015   BUN 10 05/28/2015   CREATININE 0.57 05/28/2015   BILITOT 0.5 05/28/2015   ALKPHOS 47 05/28/2015   AST 20 05/28/2015   ALT 11 05/28/2015   PROT 7.6 05/28/2015   ALBUMIN 4.0 05/28/2015   CALCIUM 9.1 05/28/2015   ANIONGAP 6 11/20/2014   EGFR >90 11/20/2014   GFR 95.43 09/04/2014   Lab Results  Component Value Date   CHOL 184 09/04/2014   Lab Results  Component Value Date   HDL 66.50 09/04/2014   Lab Results  Component Value Date   LDLCALC 102 (H) 09/04/2014   Lab Results  Component Value Date   TRIG 75.0 09/04/2014   Lab Results  Component Value Date   CHOLHDL 3 09/04/2014   No results found for: HGBA1C     Assessment & Plan:   Problem List Items Addressed This Visit    Pruritus    Zantac and Zyrtec bid, witch hazel astringent and sarna lotion      Relevant Medications   ondansetron (ZOFRAN) 4 MG tablet   Hyperlipidemia    Encouraged heart healthy diet, increase exercise, avoid trans fats, consider a krill oil cap daily      Muscle spasm   Relevant Medications   ondansetron (ZOFRAN) 4 MG tablet   Palpitations - Primary    With hyperlipidemia,  h/o breast cancer and family history of heart disease. Referred to cardiology for further consideration      Relevant Orders   Ambulatory referral to Cardiology   Acute bronchitis    Started on antibiotics, mucinex and probiotics. Given cough syrup to use qhs prn.        Other Visit Diagnoses    Non-intractable vomiting with nausea, vomiting of unspecified type       Relevant Medications   ondansetron (ZOFRAN) 4 MG tablet   Preventative health care       Relevant Medications   ondansetron (ZOFRAN) 4 MG tablet      I have discontinued Ms. Cortright's hydroquinone and amoxicillin-clavulanate. I am also having her start on azithromycin and HYDROcodone-homatropine. Additionally, I am having her maintain her multivitamin, vitamin C, calcium carbonate, cetirizine, b complex vitamins, diphenhydrAMINE, doxepin, ergocalciferol, triamcinolone cream, LORazepam, methocarbamol, albuterol, meloxicam, HYDROcodone-acetaminophen, benzonatate, and ondansetron.  Meds ordered this encounter  Medications  . ondansetron (ZOFRAN) 4 MG tablet    Sig: Take 1 tablet (4 mg total) by mouth every 8 (eight) hours as needed for nausea or vomiting.    Dispense:  30 tablet    Refill:  1  . azithromycin (ZITHROMAX) 250 MG tablet    Sig: 2 tab po once then 1 tab po daily    Dispense:  6 tablet    Refill:  0  . HYDROcodone-homatropine (HYCODAN) 5-1.5 MG/5ML syrup    Sig: Take 5 mLs by mouth every 8 (eight) hours as needed for cough.    Dispense:  120 mL    Refill:  0     Penni Homans, MD

## 2015-09-09 NOTE — Assessment & Plan Note (Signed)
Started on antibiotics, mucinex and probiotics. Given cough syrup to use qhs prn.

## 2015-09-10 ENCOUNTER — Emergency Department: Payer: BLUE CROSS/BLUE SHIELD

## 2015-09-10 ENCOUNTER — Observation Stay
Admission: EM | Admit: 2015-09-10 | Discharge: 2015-09-11 | Disposition: A | Discharge status: Home / Self Care | Payer: BLUE CROSS/BLUE SHIELD | Attending: Internal Medicine | Admitting: Internal Medicine

## 2015-09-10 ENCOUNTER — Encounter: Payer: Self-pay | Admitting: Emergency Medicine

## 2015-09-10 DIAGNOSIS — Z882 Allergy status to sulfonamides status: Secondary | ICD-10-CM | POA: Diagnosis not present

## 2015-09-10 DIAGNOSIS — R7611 Nonspecific reaction to tuberculin skin test without active tuberculosis: Secondary | ICD-10-CM | POA: Insufficient documentation

## 2015-09-10 DIAGNOSIS — Z880 Allergy status to penicillin: Secondary | ICD-10-CM | POA: Insufficient documentation

## 2015-09-10 DIAGNOSIS — J45909 Unspecified asthma, uncomplicated: Secondary | ICD-10-CM | POA: Diagnosis not present

## 2015-09-10 DIAGNOSIS — Z809 Family history of malignant neoplasm, unspecified: Secondary | ICD-10-CM | POA: Diagnosis not present

## 2015-09-10 DIAGNOSIS — Z823 Family history of stroke: Secondary | ICD-10-CM | POA: Diagnosis not present

## 2015-09-10 DIAGNOSIS — Z853 Personal history of malignant neoplasm of breast: Secondary | ICD-10-CM | POA: Insufficient documentation

## 2015-09-10 DIAGNOSIS — E079 Disorder of thyroid, unspecified: Secondary | ICD-10-CM | POA: Insufficient documentation

## 2015-09-10 DIAGNOSIS — Z79899 Other long term (current) drug therapy: Secondary | ICD-10-CM | POA: Insufficient documentation

## 2015-09-10 DIAGNOSIS — E785 Hyperlipidemia, unspecified: Secondary | ICD-10-CM | POA: Diagnosis not present

## 2015-09-10 DIAGNOSIS — R079 Chest pain, unspecified: Secondary | ICD-10-CM | POA: Diagnosis not present

## 2015-09-10 DIAGNOSIS — Z8249 Family history of ischemic heart disease and other diseases of the circulatory system: Secondary | ICD-10-CM | POA: Diagnosis not present

## 2015-09-10 DIAGNOSIS — J209 Acute bronchitis, unspecified: Secondary | ICD-10-CM | POA: Diagnosis not present

## 2015-09-10 DIAGNOSIS — G8929 Other chronic pain: Secondary | ICD-10-CM | POA: Insufficient documentation

## 2015-09-10 DIAGNOSIS — Z833 Family history of diabetes mellitus: Secondary | ICD-10-CM | POA: Diagnosis not present

## 2015-09-10 DIAGNOSIS — E559 Vitamin D deficiency, unspecified: Secondary | ICD-10-CM | POA: Insufficient documentation

## 2015-09-10 DIAGNOSIS — R0789 Other chest pain: Principal | ICD-10-CM | POA: Insufficient documentation

## 2015-09-10 DIAGNOSIS — R0602 Shortness of breath: Secondary | ICD-10-CM | POA: Diagnosis not present

## 2015-09-10 LAB — BASIC METABOLIC PANEL
ANION GAP: 5 (ref 5–15)
BUN: 12 mg/dL (ref 6–20)
CALCIUM: 8.6 mg/dL — AB (ref 8.9–10.3)
CO2: 30 mmol/L (ref 22–32)
Chloride: 102 mmol/L (ref 101–111)
Creatinine, Ser: 0.76 mg/dL (ref 0.44–1.00)
GLUCOSE: 91 mg/dL (ref 65–99)
POTASSIUM: 4.7 mmol/L (ref 3.5–5.1)
SODIUM: 137 mmol/L (ref 135–145)

## 2015-09-10 LAB — CBC
HEMATOCRIT: 38.1 % (ref 35.0–47.0)
HEMOGLOBIN: 13 g/dL (ref 12.0–16.0)
MCH: 32.3 pg (ref 26.0–34.0)
MCHC: 33.9 g/dL (ref 32.0–36.0)
MCV: 95 fL (ref 80.0–100.0)
Platelets: 200 10*3/uL (ref 150–440)
RBC: 4.02 MIL/uL (ref 3.80–5.20)
RDW: 12.5 % (ref 11.5–14.5)
WBC: 5.4 10*3/uL (ref 3.6–11.0)

## 2015-09-10 LAB — TROPONIN I

## 2015-09-10 MED ORDER — ALBUTEROL SULFATE HFA 108 (90 BASE) MCG/ACT IN AERS: 1.0000 | INHALATION_SPRAY | Freq: Four times a day (QID) | RESPIRATORY_TRACT | Status: DC | PRN

## 2015-09-10 MED ORDER — ASPIRIN 81 MG PO CHEW
324.0000 mg | CHEWABLE_TABLET | Freq: Once | ORAL | Status: AC
  Administered 2015-09-10: 324 mg via ORAL
  Filled 2015-09-10: qty 4

## 2015-09-10 MED ORDER — ACETAMINOPHEN 650 MG RE SUPP: 650.0000 mg | Freq: Four times a day (QID) | RECTAL | Status: DC | PRN

## 2015-09-10 MED ORDER — ENOXAPARIN SODIUM 40 MG/0.4ML ~~LOC~~ SOLN
40.0000 mg | SUBCUTANEOUS | Status: DC
  Administered 2015-09-10: 40 mg via SUBCUTANEOUS
  Filled 2015-09-10: qty 0.4

## 2015-09-10 MED ORDER — SODIUM CHLORIDE 0.9% FLUSH: 3.0000 mL | Freq: Two times a day (BID) | INTRAVENOUS | Status: DC

## 2015-09-10 MED ORDER — SODIUM CHLORIDE 0.9% FLUSH: 3.0000 mL | INTRAVENOUS | Status: DC | PRN

## 2015-09-10 MED ORDER — ONDANSETRON HCL 4 MG PO TABS: 4.0000 mg | ORAL_TABLET | Freq: Four times a day (QID) | ORAL | Status: DC | PRN

## 2015-09-10 MED ORDER — ASPIRIN EC 81 MG PO TBEC
81.0000 mg | DELAYED_RELEASE_TABLET | Freq: Every day | ORAL | Status: DC
  Administered 2015-09-11: 81 mg via ORAL
  Filled 2015-09-10: qty 1

## 2015-09-10 MED ORDER — DIPHENHYDRAMINE HCL 25 MG PO TABS
25.0000 mg | ORAL_TABLET | Freq: Four times a day (QID) | ORAL | Status: DC | PRN
  Filled 2015-09-10: qty 1

## 2015-09-10 MED ORDER — SODIUM CHLORIDE 0.9 % IV SOLN: 250.0000 mL | INTRAVENOUS | Status: DC | PRN

## 2015-09-10 MED ORDER — NITROGLYCERIN 0.4 MG SL SUBL: 0.4000 mg | SUBLINGUAL_TABLET | SUBLINGUAL | Status: DC | PRN

## 2015-09-10 MED ORDER — ENOXAPARIN SODIUM 40 MG/0.4ML ~~LOC~~ SOLN
SUBCUTANEOUS | Status: AC
  Filled 2015-09-10: qty 0.4

## 2015-09-10 MED ORDER — METHOCARBAMOL 500 MG PO TABS
500.0000 mg | ORAL_TABLET | Freq: Three times a day (TID) | ORAL | Status: DC | PRN
  Filled 2015-09-10: qty 1

## 2015-09-10 MED ORDER — LORAZEPAM 0.5 MG PO TABS: 0.5000 mg | ORAL_TABLET | Freq: Two times a day (BID) | ORAL | Status: DC | PRN

## 2015-09-10 MED ORDER — POLYETHYLENE GLYCOL 3350 17 G PO PACK: 17.0000 g | PACK | Freq: Every day | ORAL | Status: DC | PRN

## 2015-09-10 MED ORDER — ACETAMINOPHEN 325 MG PO TABS: 650.0000 mg | ORAL_TABLET | Freq: Four times a day (QID) | ORAL | Status: DC | PRN

## 2015-09-10 MED ORDER — ALBUTEROL SULFATE (2.5 MG/3ML) 0.083% IN NEBU: 2.5000 mg | INHALATION_SOLUTION | RESPIRATORY_TRACT | Status: DC | PRN

## 2015-09-10 MED ORDER — ONDANSETRON HCL 4 MG/2ML IJ SOLN: 4.0000 mg | Freq: Four times a day (QID) | INTRAMUSCULAR | Status: DC | PRN

## 2015-09-10 NOTE — Consult Note (Signed)
[facility]  CARDIOLOGY CONSULT NOTE  Patient ID: Gail Levine MRN: [id number] DOB/AGE: 08-Aug-1965 50 y.o.  Admit date: 09/10/2015 Referring Physician Dr. Darvin Neighbours Primary Physician   Primary Cardiologist Dr. Saralyn Pilar Reason for Consultation chest pain  HPI: Patient is a 50 year old female with history of palpitations and bradycardia. She underwent a functional study in our office today for chest pain. During the stress test she had severe midsternal chest tightness. He was sent to the emergency room for further evaluation. Electrocardiogram there showed nonspecific ST-T wave changes. She continued to have chest pain at rest. Initial serum troponin was normal. She states the chest pain is left-sided radiating into her back. She has this several times a month. She denies tobacco use but has a family history of heart disease.  Review of Systems  HENT: Negative.   Eyes: Negative.   Respiratory: Positive for shortness of breath.   Cardiovascular: Positive for chest pain and palpitations.  Gastrointestinal: Negative.   Genitourinary: Negative.   Musculoskeletal: Negative.   Skin: Negative.   Neurological: Positive for weakness.  Endo/Heme/Allergies: Negative.   Psychiatric/Behavioral: The patient is nervous/anxious.     Past Medical History:  Diagnosis Date  . Acute bronchitis 09/09/2015  . Allergy   . Asthma   . Breast cancer, left breast ([place])    2007   . Cancer ([place])    2007  . Cervical cancer screening 01/01/2015  . Dermatitis 01/04/2015  . Estrogen deficient vulvovaginitis 09/14/2014  . HIV infection ([place])   . Hyperlipidemia   . Positive PPD   . Pruritus 09/04/2014  . Thyroid disease   . Vitamin D deficiency     Family History  Problem Relation Age of Onset  . Cancer Mother     cancer  . Hypertension Father   . Stroke Father   . Heart disease Father   . Diabetes Father     Social History   Social History  . Marital status: Single   Spouse name: N/A  . Number of children: N/A  . Years of education: N/A   Occupational History  . patient care coordinator    Social History Main Topics  . Smoking status: Never Smoker  . Smokeless tobacco: Not on file  . Alcohol use 0.0 oz/week  . Drug use: No  . Sexual activity: Not on file   Other Topics Concern  . Not on file   Social History Narrative  . No narrative on file    Past Surgical History:  Procedure Laterality Date  . MASTECTOMY     left breast      (Not in a hospital admission)  Physical Exam: Blood pressure 119/72, pulse 68, temperature 98.1 F (36.7 C), temperature source Oral, resp. rate 18, height 5' (1.524 m), weight 54.4 kg (120 lb), last menstrual period 08/21/2015, SpO2 100 %.   Wt Readings from Last 1 Encounters:  09/10/15 54.4 kg (120 lb)     General appearance: alert and cooperative Head: Normocephalic, without obvious abnormality, atraumatic Resp: clear to auscultation bilaterally Chest wall: right sided chest wall tenderness, left sided chest wall tenderness Cardio: regular rate and rhythm GI: soft, non-tender; bowel sounds normal; no masses,  no organomegaly Extremities: extremities normal, atraumatic, no cyanosis or edema Pulses: 2+ and symmetric Neurologic: Grossly normal  Labs:   Lab Results  Component Value Date   WBC 5.4 09/10/2015   HGB 13.0 09/10/2015   HCT 38.1 09/10/2015   MCV 95.0 09/10/2015   PLT  200 09/10/2015    Recent Labs Lab 09/10/15 1523  NA 137  K 4.7  CL 102  CO2 30  BUN 12  CREATININE 0.76  CALCIUM 8.6*  GLUCOSE 91   Lab Results  Component Value Date   TROPONINI <0.03 09/10/2015      Radiology: CXR was unremarkable for acute cardiopulmonary disease EKG: nsr with non specific st t wave changes  ASSESSMENT AND PLAN:  50 year old female with no prior cardiac history who presented to the emergency room with complaints of midsternal chest tightness. She had a stress test at [facility] clinic today  where she had severe pain. EKG was not significant abnormal however her symptoms persisted prompting her to be sent to the emergency room. In the emergency room this far her serum troponin was normal EKG shows nonspecific changes. Renal function is normal. Given her risk factors, persistent rest pain despite an unremarkable functional study and a completely normal electrocardiogram risk and benefits of proceeding left cardiac catheterization were explained to the patient and her sister. There will discuss it overnight and make a decision in the morning as to whether they want to proceed. We'll tentatively schedule left heart catheter for tomorrow morning with further recommendations after this is completed. Would continue with aspirin and when necessary nitrates. Would hold on beta blockers for now given relative hypotension. The last serum troponin elevates would not proceed with high intensity statins at present. Signed: Teodoro Spray MD, [facility] 09/10/2015, 5:27 PM

## 2015-09-10 NOTE — ED Provider Notes (Addendum)
[facility] Emergency Department Provider Note   ____________________________________________   First MD Initiated Contact with Patient 09/10/15 1552     (approximate)  I have reviewed the triage vital signs and the nursing notes.   HISTORY  Chief Complaint Chest Pain   HPI Gail Levine is a 50 y.o. female with a history of breast cancer in remission as well as a strong family history of cardiac disease was presenting to the emergency department with chest pain. She says that she was having a stress test today that was ordered by her cardiologist when she had a squeezing 09/08/2008 pain to the middle of her chest that she also experienced in her back. She says the pain at this time is a 7-8 and that she is a baseline resting pain of a one to 5. She said at the time of the stress test that she was also having shortness of breath. She is denying any nausea or vomiting. However, she takes Zofran chronically for chronic nausea. She denies any diaphoresis. Says that she is a sister with an arrhythmia and was scheduled for an ablation as well as other relatives with coronary artery disease. Has not taken any aspirin today. She was sent in after she was unable to tolerate her stress test.   Past Medical History:  Diagnosis Date  . Acute bronchitis 09/09/2015  . Allergy   . Asthma   . Breast cancer, left breast ([place])    2007   . Cancer ([place])    2007  . Cervical cancer screening 01/01/2015  . Dermatitis 01/04/2015  . Estrogen deficient vulvovaginitis 09/14/2014  . HIV infection ([place])   . Hyperlipidemia   . Positive PPD   . Pruritus 09/04/2014  . Thyroid disease   . Vitamin D deficiency     Patient Active Problem List   Diagnosis Date Noted  . Muscle spasm 09/09/2015  . Palpitations 09/09/2015  . Acute bronchitis 09/09/2015  . Dermatitis 01/04/2015  . Cervical cancer screening 01/01/2015  . Estrogen deficient vulvovaginitis 09/14/2014  . Breast cancer, left  breast ([place])   . Positive PPD   . Hyperlipidemia   . Pruritus 09/04/2014  . Vitamin D deficiency   . Thyroid disease     Past Surgical History:  Procedure Laterality Date  . MASTECTOMY     left breast    Prior to Admission medications   Medication Sig Start Date End Date Taking? Authorizing Provider  albuterol (PROVENTIL HFA;VENTOLIN HFA) 108 (90 Base) MCG/ACT inhaler Inhale 1 puff into the lungs every 6 (six) hours as needed for wheezing or shortness of breath. 02/09/15   Mosie Lukes, MD  Ascorbic Acid (VITAMIN C) 1000 MG tablet Take 1,000 mg by mouth daily.    Historical Provider, MD  azithromycin (ZITHROMAX) 250 MG tablet 2 tab po once then 1 tab po daily 09/01/15   Mosie Lukes, MD  b complex vitamins tablet Take 1 tablet by mouth daily.    Historical Provider, MD  benzonatate (TESSALON) 100 MG capsule Take 1 capsule (100 mg total) by mouth 3 (three) times daily as needed. 08/28/15   Percell Miller Saguier, PA-C  calcium carbonate (OS-CAL) 600 MG TABS tablet Take 1,800 mg by mouth 2 (two) times daily with a meal.    Historical Provider, MD  cetirizine (ZYRTEC) 10 MG tablet Take 10 mg by mouth daily.    Historical Provider, MD  diphenhydrAMINE (BENADRYL) 25 MG tablet Take 25 mg by mouth every 6 (six) hours  as needed.    Historical Provider, MD  doxepin (SINEQUAN) 10 MG capsule Take 10 mg by mouth.    Historical Provider, MD  ergocalciferol (VITAMIN D2) 50000 UNITS capsule Take 1 capsule (50,000 Units total) by mouth once a week. 11/20/14   Eliezer Bottom, NP  HYDROcodone-acetaminophen (NORCO) 5-325 MG tablet Take 1 tablet by mouth every 6 (six) hours as needed for moderate pain. Patient not taking: Reported on 09/01/2015 07/07/15   Mackie Pai, PA-C  HYDROcodone-homatropine [facility]) 5-1.5 MG/5ML syrup Take 5 mLs by mouth every 8 (eight) hours as needed for cough. 09/01/15   Mosie Lukes, MD  LORazepam (ATIVAN) 0.5 MG tablet Take 1 tablet (0.5 mg total) by mouth 2 (two) times daily as  needed for anxiety. 02/09/15   Mosie Lukes, MD  meloxicam (MOBIC) 7.5 MG tablet 1-2 tab po  q day 07/07/15   Mackie Pai, PA-C  methocarbamol (ROBAXIN) 500 MG tablet Take 1 tablet (500 mg total) by mouth every 8 (eight) hours as needed for muscle spasms. 02/09/15   Mosie Lukes, MD  Multiple Vitamin (MULTIVITAMIN) tablet Take 1 tablet by mouth daily.    Historical Provider, MD  ondansetron (ZOFRAN) 4 MG tablet Take 1 tablet (4 mg total) by mouth every 8 (eight) hours as needed for nausea or vomiting. 09/01/15   Mosie Lukes, MD  triamcinolone cream (KENALOG) 0.1 % Apply 1 application topically 2 (two) times daily. 01/02/15   Mosie Lukes, MD    Allergies Sulfa antibiotics and Penicillins  Family History  Problem Relation Age of Onset  . Cancer Mother     cancer  . Hypertension Father   . Stroke Father   . Heart disease Father   . Diabetes Father     Social History Social History  Substance Use Topics  . Smoking status: Never Smoker  . Smokeless tobacco: Not on file  . Alcohol use 0.0 oz/week    Review of Systems Constitutional: No fever/chills Eyes: No visual changes. ENT: No sore throat. Cardiovascular: As above Respiratory: As above Gastrointestinal: No abdominal pain.  No nausea, no vomiting.  No diarrhea.  No constipation. Genitourinary: Negative for dysuria. Musculoskeletal: Negative for back pain. Skin: Negative for rash. Neurological: Negative for headaches, focal weakness or numbness.  10-point ROS otherwise negative.  ____________________________________________   PHYSICAL EXAM:  VITAL SIGNS: ED Triage Vitals  Enc Vitals Group     BP 09/10/15 1519 119/72     Pulse Rate 09/10/15 1519 68     Resp 09/10/15 1519 18     Temp 09/10/15 1519 98.1 F (36.7 C)     Temp Source 09/10/15 1519 Oral     SpO2 09/10/15 1519 100 %     Weight 09/10/15 1520 120 lb (54.4 kg)     Height 09/10/15 1520 5' (1.524 m)     Head Circumference --      Peak Flow --       Pain Score 09/10/15 1520 8     Pain Loc --      Pain Edu? --      Excl. in [place]? --     Constitutional: Alert and oriented. Well appearing and in no acute distress. Eyes: Conjunctivae are normal. PERRL. EOMI. Head: Atraumatic. Nose: No congestion/rhinnorhea. Mouth/Throat: Mucous membranes are moist.   Neck: No stridor.   Cardiovascular: Normal rate, regular rhythm. Grossly normal heart sounds.  Good peripheral circulation with equal and bilateral radial pulses. Chest pain is also reproducible palpation.  Respiratory: Normal respiratory effort.  No retractions. Lungs CTAB. Gastrointestinal: Soft and nontender. No distention. No CVA tenderness. Musculoskeletal: No lower extremity tenderness nor edema.  No joint effusions. Neurologic:  Normal speech and language. No gross focal neurologic deficits are appreciated. Skin:  Skin is warm, dry and intact. No rash noted. Psychiatric: Mood and affect are normal. Speech and behavior are normal.  ____________________________________________   LABS (all labs ordered are listed, but only abnormal results are displayed)  Labs Reviewed  BASIC METABOLIC PANEL - Abnormal; Notable for the following:       Result Value   Calcium 8.6 (*)    All other components within normal limits  CBC  TROPONIN I   ____________________________________________  EKG  ED ECG REPORT I, Ashawn Rinehart,  Youlanda Roys, the attending physician, personally viewed and interpreted this ECG.   Date: 09/10/2015  EKG Time: 1500  Rate: 68  Rhythm: normal sinus rhythm  Axis: Normal  Intervals:none  ST&T Change: T-wave inversion in aVL which is new from 2015. Also with biphasic T-wave in V2. Mild and diffuse ST elevation without any convex morphology. Possible benign early repolarization.  ____________________________________________  RADIOLOGY  DG Chest 2 View (Accession [id number]) (Order [id number])  Imaging  Date: 09/10/2015 Department: [facility] Released By: Olene Floss, RN (auto-released) Authorizing: Orbie Pyo, MD  PACS Images   Show images for DG Chest 2 View  Study Result   CLINICAL DATA:  50 year old female with chest tightness during stress test  EXAM: CHEST  2 VIEW  COMPARISON:  None.  FINDINGS: The lungs are clear and negative for focal airspace consolidation, pulmonary edema or suspicious pulmonary nodule. No pleural effusion or pneumothorax. Cardiac and mediastinal contours are within normal limits. No acute fracture or lytic or blastic osseous lesions. The visualized upper abdominal bowel gas pattern is unremarkable.  IMPRESSION: Negative chest x-ray.   Electronically Signed   By: Jacqulynn Cadet M.D.   On: 09/10/2015 15:47     ____________________________________________   PROCEDURES  Procedure(s) performed:   Procedures  Critical Care performed:   ____________________________________________   INITIAL IMPRESSION / ASSESSMENT AND PLAN / ED COURSE  Pertinent labs & imaging results that were available during my care of the patient were reviewed by me and considered in my medical decision making ([facility] chart for details).  ----------------------------------------- 4:18 PM on 09/10/2015 -----------------------------------------  Patient says that aspirin usually relieves her chest pain. We will give 325 mg of baby aspirin. I advised the patient that admission to the hospital would be the safest course of action because her family history as well as the inability to tolerate her stress test today. She is understanding of this plan and willing to comply.  Clinical Course     ____________________________________________   FINAL CLINICAL IMPRESSION(S) / ED DIAGNOSES  Chest pain.    NEW MEDICATIONS STARTED DURING THIS VISIT:  New Prescriptions   No medications on file     Note:  This document was prepared using Dragon voice  recognition software and may include unintentional dictation errors.    Orbie Pyo, MD 09/10/15 [phone]  Signed out to Dr. Darvin Neighbours of the medicine service.   Orbie Pyo, MD 09/10/15 [phone]

## 2015-09-10 NOTE — ED Notes (Signed)
Pt states "I do not need anything for pain right now"

## 2015-09-10 NOTE — ED Triage Notes (Signed)
Pt was at [facility] today having stress test today d/t follow up of abnormal ekg. While pt was walking on the treadmill she began having squeezing chest pain. Pt reports she also was having sob with chest pain but no more than usual.

## 2015-09-10 NOTE — ED Notes (Signed)
Triage completed by Myself, Nira Conn, RN using [facility] log in information.

## 2015-09-10 NOTE — H&P (Signed)
[place] [facility] [place] [facility]    MR#:  [id number]  DATE OF BIRTH:  02-07-1965  DATE OF ADMISSION:  09/10/2015  PRIMARY CARE PHYSICIAN: Penni Homans, MD   REQUESTING/REFERRING PHYSICIAN: Dr. Lucita Lora  CHIEF COMPLAINT:   Chief Complaint  Patient presents with  . Chest Pain    HISTORY OF PRESENT ILLNESS:  Gail Levine  is a 50 y.o. female with a known history of Breast cancer in remission, hyperlipidemia, asthma presents to the emergency room sent in from [facility] clinic where patient had acutely worsening chest pain while on the treadmill for a stress test ordered by Dr. Stephani Police. Patient has had different kinds of pain one of which is chronic chest pain since being diagnosed with breast cancer which worsens with exercise. Also has a new midsternal chest pain for the past few weeks for which she saw cardiology and was scheduled for a stress test today. While she was on the treadmill she developed pain in her chest starting from the lower left back into her left chest and into her neck. This was associated with shortness of breath. Here in the emergency room patient's EKG is unchanged and troponin is normal.  PAST MEDICAL HISTORY:   Past Medical History:  Diagnosis Date  . Acute bronchitis 09/09/2015  . Allergy   . Asthma   . Breast cancer, left breast ([place])    2007   . Cancer ([place])    2007  . Cervical cancer screening 01/01/2015  . Dermatitis 01/04/2015  . Estrogen deficient vulvovaginitis 09/14/2014  . HIV infection ([place])   . Hyperlipidemia   . Positive PPD   . Pruritus 09/04/2014  . Thyroid disease   . Vitamin D deficiency     PAST SURGICAL HISTORY:   Past Surgical History:  Procedure Laterality Date  . MASTECTOMY     left breast    SOCIAL HISTORY:   Social History  Substance Use Topics  . Smoking status: Never Smoker  . Smokeless tobacco: Not on file  . Alcohol use 0.0 oz/week    FAMILY HISTORY:   Family  History  Problem Relation Age of Onset  . Cancer Mother     cancer  . Hypertension Father   . Stroke Father   . Heart disease Father   . Diabetes Father     DRUG ALLERGIES:   Allergies  Allergen Reactions  . Sulfa Antibiotics Anaphylaxis  . Penicillins Rash    REVIEW OF SYSTEMS:   Review of Systems  Constitutional: Negative for chills and fever.  HENT: Negative for sore throat.   Eyes: Negative for blurred vision, double vision and pain.  Respiratory: Positive for shortness of breath. Negative for cough, hemoptysis and wheezing.   Cardiovascular: Positive for chest pain. Negative for palpitations, orthopnea and leg swelling.  Gastrointestinal: Negative for abdominal pain, constipation, diarrhea, heartburn, nausea and vomiting.  Genitourinary: Negative for dysuria and hematuria.  Musculoskeletal: Negative for back pain and joint pain.  Skin: Negative for rash.  Neurological: Negative for sensory change, speech change, focal weakness and headaches.  Endo/Heme/Allergies: Does not bruise/bleed easily.  Psychiatric/Behavioral: Negative for depression. The patient is not nervous/anxious.     MEDICATIONS AT HOME:   Prior to Admission medications   Medication Sig Start Date End Date Taking? Authorizing Provider  albuterol (PROVENTIL HFA;VENTOLIN HFA) 108 (90 Base) MCG/ACT inhaler Inhale 1 puff into the lungs every 6 (six) hours as needed for wheezing or shortness of breath.  02/09/15  Yes Mosie Lukes, MD  LORazepam (ATIVAN) 0.5 MG tablet Take 1 tablet (0.5 mg total) by mouth 2 (two) times daily as needed for anxiety. 02/09/15  Yes Mosie Lukes, MD  methocarbamol (ROBAXIN) 500 MG tablet Take 1 tablet (500 mg total) by mouth every 8 (eight) hours as needed for muscle spasms. 02/09/15  Yes Mosie Lukes, MD  ondansetron (ZOFRAN) 4 MG tablet Take 1 tablet (4 mg total) by mouth every 8 (eight) hours as needed for nausea or vomiting. 09/01/15  Yes Mosie Lukes, MD  Ascorbic Acid (VITAMIN  C) 1000 MG tablet Take 1,000 mg by mouth daily.    Historical Provider, MD  azithromycin (ZITHROMAX) 250 MG tablet 2 tab po once then 1 tab po daily 09/01/15   Mosie Lukes, MD  b complex vitamins tablet Take 1 tablet by mouth daily.    Historical Provider, MD  benzonatate (TESSALON) 100 MG capsule Take 1 capsule (100 mg total) by mouth 3 (three) times daily as needed. 08/28/15   Percell Miller Saguier, PA-C  calcium carbonate (OS-CAL) 600 MG TABS tablet Take 1,800 mg by mouth 2 (two) times daily with a meal.    Historical Provider, MD  cetirizine (ZYRTEC) 10 MG tablet Take 10 mg by mouth daily.    Historical Provider, MD  diphenhydrAMINE (BENADRYL) 25 MG tablet Take 25 mg by mouth every 6 (six) hours as needed.    Historical Provider, MD  doxepin (SINEQUAN) 10 MG capsule Take 10 mg by mouth.    Historical Provider, MD  ergocalciferol (VITAMIN D2) 50000 UNITS capsule Take 1 capsule (50,000 Units total) by mouth once a week. 11/20/14   Eliezer Bottom, NP  HYDROcodone-acetaminophen (NORCO) 5-325 MG tablet Take 1 tablet by mouth every 6 (six) hours as needed for moderate pain. Patient not taking: Reported on 09/01/2015 07/07/15   Mackie Pai, PA-C  HYDROcodone-homatropine [facility]) 5-1.5 MG/5ML syrup Take 5 mLs by mouth every 8 (eight) hours as needed for cough. 09/01/15   Mosie Lukes, MD  meloxicam (MOBIC) 7.5 MG tablet 1-2 tab po  q day 07/07/15   Mackie Pai, PA-C  Multiple Vitamin (MULTIVITAMIN) tablet Take 1 tablet by mouth daily.    Historical Provider, MD  triamcinolone cream (KENALOG) 0.1 % Apply 1 application topically 2 (two) times daily. Patient taking differently: Apply 1 application topically every Sunday.  01/02/15   Mosie Lukes, MD     VITAL SIGNS:  Blood pressure 119/72, pulse 68, temperature 98.1 F (36.7 C), temperature source Oral, resp. rate 18, height 5' (1.524 m), weight 54.4 kg (120 lb), last menstrual period 08/21/2015, SpO2 100 %.  PHYSICAL EXAMINATION:  Physical  Exam  GENERAL:  50 y.o.-year-old patient lying in the bed with no acute distress.  EYES: Pupils equal, round, reactive to light and accommodation. No scleral icterus. Extraocular muscles intact.  HEENT: Head atraumatic, normocephalic. Oropharynx and nasopharynx clear. No oropharyngeal erythema, moist oral mucosa  NECK:  Supple, no jugular venous distention. No thyroid enlargement, no tenderness.  LUNGS: Normal breath sounds bilaterally, no wheezing, rales, rhonchi. No use of accessory muscles of respiration.  CARDIOVASCULAR: S1, S2 normal. No murmurs, rubs, or gallops.  ABDOMEN: Soft, nontender, nondistended. Bowel sounds present. No organomegaly or mass.  EXTREMITIES: No pedal edema, cyanosis, or clubbing. + 2 pedal & radial pulses b/l.   NEUROLOGIC: Cranial nerves II through XII are intact. No focal Motor or sensory deficits appreciated b/l PSYCHIATRIC: The patient is alert and oriented x  3. Good affect.  SKIN: No obvious rash, lesion, or ulcer.   LABORATORY PANEL:   CBC  Recent Labs Lab 09/10/15 1523  WBC 5.4  HGB 13.0  HCT 38.1  PLT 200   ------------------------------------------------------------------------------------------------------------------  Chemistries   Recent Labs Lab 09/10/15 1523  NA 137  K 4.7  CL 102  CO2 30  GLUCOSE 91  BUN 12  CREATININE 0.76  CALCIUM 8.6*   ------------------------------------------------------------------------------------------------------------------  Cardiac Enzymes  Recent Labs Lab 09/10/15 1523  TROPONINI <0.03   ------------------------------------------------------------------------------------------------------------------  RADIOLOGY:  Dg Chest 2 View  Result Date: 09/10/2015 CLINICAL DATA:  50 year old female with chest tightness during stress test EXAM: CHEST  2 VIEW COMPARISON:  None. FINDINGS: The lungs are clear and negative for focal airspace consolidation, pulmonary edema or suspicious pulmonary nodule.  No pleural effusion or pneumothorax. Cardiac and mediastinal contours are within normal limits. No acute fracture or lytic or blastic osseous lesions. The visualized upper abdominal bowel gas pattern is unremarkable. IMPRESSION: Negative chest x-ray. Electronically Signed   By: Jacqulynn Cadet M.D.   On: 09/10/2015 15:47     IMPRESSION AND PLAN:   * Chest pain Patient has had both typical and atypical kinds of chest pain chronically. Today she had worsening chest pain while getting a stress test on the treadmill. I feel she will need cardiac catheterization for further evaluation. Discussed with Dr. Ubaldo Glassing of cardiology who will Hasten the patient. Patient will be kept nothing by mouth after midnight.  * DVT prophylaxis with Lovenox  All the records are reviewed and case discussed with ED provider. Management plans discussed with the patient, family and they are in agreement.  CODE STATUS: FULL CODE  TOTAL TIME TAKING CARE OF THIS PATIENT: 40 minutes.   Hillary Bow R M.D on 09/10/2015 at 4:52 PM  Between 7am to 6pm - Pager - [phone]  After 6pm go to [email or web address] - password [email or web address] [facility]  Office  [phone]  CC: Primary care physician; Penni Homans, MD  Note: This dictation was prepared with Dragon dictation along with smaller phrase technology. Any transcriptional errors that result from this process are unintentional.

## 2015-09-11 ENCOUNTER — Encounter
Admission: EM | Disposition: A | Discharge status: Home / Self Care | Payer: Self-pay | Source: Home / Self Care | Attending: Emergency Medicine

## 2015-09-11 DIAGNOSIS — J45909 Unspecified asthma, uncomplicated: Secondary | ICD-10-CM | POA: Diagnosis not present

## 2015-09-11 DIAGNOSIS — R079 Chest pain, unspecified: Secondary | ICD-10-CM | POA: Diagnosis not present

## 2015-09-11 LAB — TROPONIN I

## 2015-09-11 SURGERY — LEFT HEART CATH AND CORONARY ANGIOGRAPHY
Anesthesia: Moderate Sedation

## 2015-09-11 NOTE — Progress Notes (Signed)
Orders received for pt to d/c to home. Discharge instructions given with verbal understanding, Iv and tele removed and pt currently getting dressed for d/c. Volunteer services called for wheelchair.

## 2015-09-11 NOTE — Progress Notes (Signed)
[place]  SUBJECTIVE: No further chest pain. No sob. Did well last pm. Ruled out for an mi.    Vitals:   09/10/15 1520 09/10/15 1838 09/10/15 1859 09/11/15 0624  BP:  126/69 126/69 109/60  Pulse:  72 (!) 57 (!) 51  Resp:   16 18  Temp:  98.2 F (36.8 C) 98.2 F (36.8 C) 98 F (36.7 C)  TempSrc:  Oral Oral Oral  SpO2:   98% 99%  Weight: 54.4 kg (120 lb) 52.8 kg (116 lb 6.5 oz)  53.1 kg (117 lb 1.6 oz)  Height: 5' (1.524 m) 5' (1.524 m)      Intake/Output Summary (Last 24 hours) at 09/11/15 0733 Last data filed at 09/11/15 0626  Gross per 24 hour  Intake                0 ml  Output                0 ml  Net                0 ml    LABS: Basic Metabolic Panel:  Recent Labs  09/10/15 1523  NA 137  K 4.7  CL 102  CO2 30  GLUCOSE 91  BUN 12  CREATININE 0.76  CALCIUM 8.6*   Liver Function Tests: No results for input(s): AST, ALT, ALKPHOS, BILITOT, PROT, ALBUMIN in the last 72 hours. No results for input(s): LIPASE, AMYLASE in the last 72 hours. CBC:  Recent Labs  09/10/15 1523  WBC 5.4  HGB 13.0  HCT 38.1  MCV 95.0  PLT 200   Cardiac Enzymes:  Recent Labs  09/10/15 1523 09/10/15 1957 09/11/15 0133  TROPONINI <0.03 <0.03 <0.03   BNP: Invalid input(s): POCBNP D-Dimer: No results for input(s): DDIMER in the last 72 hours. Hemoglobin A1C: No results for input(s): HGBA1C in the last 72 hours. Fasting Lipid Panel: No results for input(s): CHOL, HDL, LDLCALC, TRIG, CHOLHDL, LDLDIRECT in the last 72 hours. Thyroid Function Tests: No results for input(s): TSH, T4TOTAL, T3FREE, THYROIDAB in the last 72 hours.  Invalid input(s): FREET3 Anemia Panel: No results for input(s): VITAMINB12, FOLATE, FERRITIN, TIBC, IRON, RETICCTPCT in the last 72 hours.   Physical Exam: Blood pressure 109/60, pulse (!) 51, temperature 98 F (36.7 C), temperature source Oral, resp. rate 18, height 5' (1.524 m), weight 53.1 kg (117 lb 1.6  oz), last menstrual period 08/21/2015, SpO2 99 %.   Wt Readings from Last 1 Encounters:  09/11/15 53.1 kg (117 lb 1.6 oz)     General appearance: alert and cooperative Resp: clear to auscultation bilaterally Chest wall: no tenderness Cardio: regular rate and rhythm Extremities: extremities normal, atraumatic, no cyanosis or edema Neurologic: Grossly normal  TELEMETRY: Reviewed telemetry pt in nsr:  ASSESSMENT AND PLAN:  Active Problems:   Chest pain-No further chest pain. Ruled out for mi. Pain is atypical for angina and ett was normal. Will ambulate and recommend discharge on current meds. WIll cancel cardiac cath. Follow up with Dr. Saralyn Pilar next week.     Teodoro Spray., MD, [facility] 09/11/2015 7:33 AM

## 2015-09-13 NOTE — Discharge Summary (Signed)
[facility]    MR#:  [id number]  DATE OF BIRTH:  [birth year]  DATE OF ADMISSION:  09/10/2015 ADMITTING PHYSICIAN: Hillary Bow, MD  DATE OF DISCHARGE: 09/11/2015 11:35 AM  PRIMARY CARE PHYSICIAN: Penni Homans, MD    ADMISSION DIAGNOSIS:  Chest pain, unspecified chest pain type [R07.9]  DISCHARGE DIAGNOSIS:  Active Problems:   Chest pain    Ruled out ACS  SECONDARY DIAGNOSIS:   Past Medical History:  Diagnosis Date  . Acute bronchitis 09/09/2015  . Allergy   . Asthma   . Breast cancer, left breast ([place])    2007   . Cancer ([place])    2007  . Cervical cancer screening 01/01/2015  . Dermatitis 01/04/2015  . Estrogen deficient vulvovaginitis 09/14/2014  . HIV infection ([place])   . Hyperlipidemia   . Positive PPD   . Pruritus 09/04/2014  . Thyroid disease   . Vitamin D deficiency     HOSPITAL COURSE:   * Chest pain Patient has had both typical and atypical kinds of chest pain chronically.   she had worsening chest pain while getting a stress test on the treadmill. Discussed with Dr. Ubaldo Glassing of cardiology , monitored on tele.  her serial troponin and EKG remained stable.  She was pain free in hospital, so cardiologist suggested to d/c and follow in office in 1 week. * DVT prophylaxis with Lovenox  DISCHARGE CONDITIONS:   Stable.  CONSULTS OBTAINED:  Treatment Team:  Teodoro Spray, MD  DRUG ALLERGIES:   Allergies  Allergen Reactions  . Sulfa Antibiotics Anaphylaxis  . Penicillins Rash    DISCHARGE MEDICATIONS:   Discharge Medication List as of 09/11/2015 10:46 AM    CONTINUE these medications which have NOT CHANGED   Details  albuterol (PROVENTIL HFA;VENTOLIN HFA) 108 (90 Base) MCG/ACT inhaler Inhale 1 puff into the lungs every 6 (six) hours as needed for wheezing or shortness of breath., Starting Mon 02/09/2015, Normal    Ascorbic Acid (VITAMIN C) 1000 MG tablet Take 1,000 mg by mouth  daily., Historical Med    b complex vitamins tablet Take 1 tablet by mouth daily., Historical Med    benzonatate (TESSALON) 100 MG capsule Take 1 capsule (100 mg total) by mouth 3 (three) times daily as needed., Starting Fri 08/28/2015, Normal    calcium carbonate (OS-CAL) 600 MG TABS tablet Take 1,800 mg by mouth 2 (two) times daily with a meal., Historical Med    cetirizine (ZYRTEC) 10 MG tablet Take 10 mg by mouth daily., Historical Med    diphenhydrAMINE (BENADRYL) 25 MG tablet Take 25 mg by mouth every 6 (six) hours as needed for itching. , Historical Med    HYDROcodone-acetaminophen (NORCO) 5-325 MG tablet Take 1 tablet by mouth every 6 (six) hours as needed for moderate pain., Starting Tue 07/07/2015, Print    HYDROcodone-homatropine (HYCODAN) 5-1.5 MG/5ML syrup Take 5 mLs by mouth every 8 (eight) hours as needed for cough., Starting Tue 09/01/2015, Print    LORazepam (ATIVAN) 0.5 MG tablet Take 1 tablet (0.5 mg total) by mouth 2 (two) times daily as needed for anxiety., Starting Mon 02/09/2015, Print    methocarbamol (ROBAXIN) 500 MG tablet Take 1 tablet (500 mg total) by mouth every 8 (eight) hours as needed for muscle spasms., Starting Mon 02/09/2015, Normal    Multiple Vitamin (MULTIVITAMIN) tablet Take 1 tablet by mouth daily., Historical Med    ondansetron (ZOFRAN) 4 MG tablet  Take 1 tablet (4 mg total) by mouth every 8 (eight) hours as needed for nausea or vomiting., Starting Tue 09/01/2015, Normal         DISCHARGE INSTRUCTIONS:    Follow with cardiology clinic.  If you experience worsening of your admission symptoms, develop shortness of breath, life threatening emergency, suicidal or homicidal thoughts you must seek medical attention immediately by calling 911 or calling your MD immediately  if symptoms less severe.  You Must read complete instructions/literature along with all the possible adverse reactions/side effects for all the Medicines you take and that have been  prescribed to you. Take any new Medicines after you have completely understood and accept all the possible adverse reactions/side effects.   Please note  You were cared for by a hospitalist during your hospital stay. If you have any questions about your discharge medications or the care you received while you were in the hospital after you are discharged, you can call the unit and asked to speak with the hospitalist on call if the hospitalist that took care of you is not available. Once you are discharged, your primary care physician will handle any further medical issues. Please note that NO REFILLS for any discharge medications will be authorized once you are discharged, as it is imperative that you return to your primary care physician (or establish a relationship with a primary care physician if you do not have one) for your aftercare needs so that they can reassess your need for medications and monitor your lab values.    Today   CHIEF COMPLAINT:   Chief Complaint  Patient presents with  . Chest Pain    HISTORY OF PRESENT ILLNESS:  Gail Levine  is a 50 y.o. female with a known history of Breast cancer in remission, hyperlipidemia, asthma presents to the emergency room sent in from [facility] clinic where patient had acutely worsening chest pain while on the treadmill for a stress test ordered by Dr. Stephani Police. Patient has had different kinds of pain one of which is chronic chest pain since being diagnosed with breast cancer which worsens with exercise. Also has a new midsternal chest pain for the past few weeks for which she saw cardiology and was scheduled for a stress test today. While she was on the treadmill she developed pain in her chest starting from the lower left back into her left chest and into her neck. This was associated with shortness of breath. Here in the emergency room patient's EKG is unchanged and troponin is normal.   VITAL SIGNS:  Blood pressure (!) 115/59, pulse (!)  55, temperature 98.1 F (36.7 C), temperature source Oral, resp. rate 15, height 5' (1.524 m), weight 53.1 kg (117 lb 1.6 oz), last menstrual period 08/21/2015, SpO2 99 %.  I/O:  No intake or output data in the 24 hours ending 09/13/15 0754  PHYSICAL EXAMINATION:  GENERAL:  50 y.o.-year-old patient lying in the bed with no acute distress.  EYES: Pupils equal, round, reactive to light and accommodation. No scleral icterus. Extraocular muscles intact.  HEENT: Head atraumatic, normocephalic. Oropharynx and nasopharynx clear.  NECK:  Supple, no jugular venous distention. No thyroid enlargement, no tenderness.  LUNGS: Normal breath sounds bilaterally, no wheezing, rales,rhonchi or crepitation. No use of accessory muscles of respiration.  CARDIOVASCULAR: S1, S2 normal. No murmurs, rubs, or gallops.  ABDOMEN: Soft, non-tender, non-distended. Bowel sounds present. No organomegaly or mass.  EXTREMITIES: No pedal edema, cyanosis, or clubbing.  NEUROLOGIC: Cranial nerves II through  XII are intact. Muscle strength 5/5 in all extremities. Sensation intact. Gait not checked.  PSYCHIATRIC: The patient is alert and oriented x 3.  SKIN: No obvious rash, lesion, or ulcer.   DATA REVIEW:   CBC  Recent Labs Lab 09/10/15 1523  WBC 5.4  HGB 13.0  HCT 38.1  PLT 200    Chemistries   Recent Labs Lab 09/10/15 1523  NA 137  K 4.7  CL 102  CO2 30  GLUCOSE 91  BUN 12  CREATININE 0.76  CALCIUM 8.6*    Cardiac Enzymes  Recent Labs Lab 09/11/15 0133  TROPONINI <0.03    Microbiology Results  No results found for this or any previous visit.  RADIOLOGY:  No results found.  EKG:   Orders placed or performed during the hospital encounter of 09/10/15  . EKG 12-Lead  . EKG 12-Lead  . ED EKG within 10 minutes  . ED EKG within 10 minutes      Management plans discussed with the patient, family and they are in agreement.  CODE STATUS:  Code Status History    Date Active Date  Inactive Code Status Order ID Comments User Context   09/10/2015  4:52 PM 09/11/2015  2:35 PM Full Code [id number]  Hillary Bow, MD ED      TOTAL TIME TAKING CARE OF THIS PATIENT: 35 minutes.    Vaughan Basta M.D on 09/13/2015 at 7:54 AM  Between 7am to 6pm - Pager - [phone]  After 6pm go to [email or web address] - password [email or web address] [place] [facility]  Office  [phone]  CC: Primary care physician; Penni Homans, MD   Note: This dictation was prepared with Dragon dictation along with smaller phrase technology. Any transcriptional errors that result from this process are unintentional.

## 2015-09-14 ENCOUNTER — Other Ambulatory Visit: Payer: BLUE CROSS/BLUE SHIELD

## 2015-09-14 DIAGNOSIS — R002 Palpitations: Secondary | ICD-10-CM | POA: Diagnosis not present

## 2015-09-15 ENCOUNTER — Encounter: Payer: BLUE CROSS/BLUE SHIELD | Admitting: Family Medicine

## 2015-09-17 ENCOUNTER — Encounter: Payer: BLUE CROSS/BLUE SHIELD | Admitting: Family Medicine

## 2015-09-17 DIAGNOSIS — R0602 Shortness of breath: Secondary | ICD-10-CM | POA: Diagnosis not present

## 2015-09-17 DIAGNOSIS — R079 Chest pain, unspecified: Secondary | ICD-10-CM | POA: Diagnosis not present

## 2015-09-17 DIAGNOSIS — R002 Palpitations: Secondary | ICD-10-CM | POA: Diagnosis not present

## 2015-09-17 DIAGNOSIS — J4540 Moderate persistent asthma, uncomplicated: Secondary | ICD-10-CM | POA: Diagnosis not present

## 2015-09-28 ENCOUNTER — Other Ambulatory Visit (HOSPITAL_BASED_OUTPATIENT_CLINIC_OR_DEPARTMENT_OTHER): Payer: BLUE CROSS/BLUE SHIELD

## 2015-09-28 ENCOUNTER — Ambulatory Visit (HOSPITAL_BASED_OUTPATIENT_CLINIC_OR_DEPARTMENT_OTHER): Payer: BLUE CROSS/BLUE SHIELD | Admitting: Hematology & Oncology

## 2015-09-28 ENCOUNTER — Encounter: Payer: Self-pay | Admitting: Hematology & Oncology

## 2015-09-28 VITALS — BP 104/64 | HR 66 | Temp 98.3°F | Resp 16 | Ht 60.0 in | Wt 119.0 lb

## 2015-09-28 DIAGNOSIS — Z853 Personal history of malignant neoplasm of breast: Secondary | ICD-10-CM

## 2015-09-28 DIAGNOSIS — C50112 Malignant neoplasm of central portion of left female breast: Secondary | ICD-10-CM

## 2015-09-28 DIAGNOSIS — C50212 Malignant neoplasm of upper-inner quadrant of left female breast: Secondary | ICD-10-CM

## 2015-09-28 DIAGNOSIS — E559 Vitamin D deficiency, unspecified: Secondary | ICD-10-CM

## 2015-09-28 LAB — CBC WITH DIFFERENTIAL (CANCER CENTER ONLY)
BASO#: 0 10*3/uL (ref 0.0–0.2)
BASO%: 0.4 % (ref 0.0–2.0)
EOS ABS: 0.2 10*3/uL (ref 0.0–0.5)
EOS%: 4.2 % (ref 0.0–7.0)
HCT: 38.6 % (ref 34.8–46.6)
HGB: 13.1 g/dL (ref 11.6–15.9)
LYMPH#: 1.3 10*3/uL (ref 0.9–3.3)
LYMPH%: 27.4 % (ref 14.0–48.0)
MCH: 32.1 pg (ref 26.0–34.0)
MCHC: 33.9 g/dL (ref 32.0–36.0)
MCV: 95 fL (ref 81–101)
MONO#: 0.5 10*3/uL (ref 0.1–0.9)
MONO%: 10.8 % (ref 0.0–13.0)
NEUT#: 2.7 10*3/uL (ref 1.5–6.5)
NEUT%: 57.2 % (ref 39.6–80.0)
PLATELETS: 198 10*3/uL (ref 145–400)
RBC: 4.08 10*6/uL (ref 3.70–5.32)
RDW: 12 % (ref 11.1–15.7)
WBC: 4.7 10*3/uL (ref 3.9–10.0)

## 2015-09-28 LAB — COMPREHENSIVE METABOLIC PANEL
ALT: 11 U/L (ref 0–55)
AST: 21 U/L (ref 5–34)
Albumin: 3.6 g/dL (ref 3.5–5.0)
Alkaline Phosphatase: 54 U/L (ref 40–150)
Anion Gap: 11 mEq/L (ref 3–11)
BUN: 7.5 mg/dL (ref 7.0–26.0)
CHLORIDE: 108 meq/L (ref 98–109)
CO2: 24 meq/L (ref 22–29)
Calcium: 8.9 mg/dL (ref 8.4–10.4)
Creatinine: 0.7 mg/dL (ref 0.6–1.1)
GLUCOSE: 110 mg/dL (ref 70–140)
POTASSIUM: 3.4 meq/L — AB (ref 3.5–5.1)
SODIUM: 143 meq/L (ref 136–145)
TOTAL PROTEIN: 8 g/dL (ref 6.4–8.3)
Total Bilirubin: 0.55 mg/dL (ref 0.20–1.20)

## 2015-09-28 LAB — LACTATE DEHYDROGENASE: LDH: 182 U/L (ref 125–245)

## 2015-09-28 NOTE — Progress Notes (Signed)
Hematology and Oncology Follow Up Visit  Gail Levine [id number] [birth year] 50 y.o. 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 She apparently had some issues with chest pain. She went to a cardiologist. She failed a stress test. I'm not sure she failed a stress test. She had a the hospital. She had negative cardiac markers. An EKG was normal. She was not found have any cardiac issue.  She was discharged. She went to the cardiologist a week later. It sounds like he wants to put her on a beta blocker. She wanted to talk to me about this.  I don't think that there is any issue with her being on a beta blocker. She is on albuterol but only takes this as needed.   She feels okay right now.  She has had no issue with congestive heart failure.    There is no weight loss wakening. She's had no cough or shortness of breath. There is no change in bowel or bladder habits. She's had no rashes.    Medications:    Medication List       Accurate as of 09/28/15  2:57 PM. Always use your most recent med list.          albuterol 108 (90 Base) MCG/ACT inhaler Commonly known as:  PROVENTIL HFA;VENTOLIN HFA Inhale 1 puff into the lungs every 6 (six) hours as needed for wheezing or shortness of breath.   b complex vitamins tablet Take 1 tablet by mouth daily.   calcium carbonate 600 MG Tabs tablet Commonly known as:  OS-CAL Take 1,800 mg by mouth 2 (two) times daily with a meal.   cetirizine 10 MG tablet Commonly known as:  ZYRTEC Take 10 mg by mouth daily.   diphenhydrAMINE 25 MG tablet Commonly known as:  BENADRYL Take 25 mg by mouth every 6 (six) hours as needed for itching.   HYDROcodone-acetaminophen 5-325 MG tablet Commonly known as:  NORCO Take 1 tablet by mouth every 6 (six)  hours as needed for moderate pain.   HYDROcodone-homatropine 5-1.5 MG/5ML syrup Commonly known as:  HYCODAN Take 5 mLs by mouth every 8 (eight) hours as needed for cough.   LORazepam 0.5 MG tablet Commonly known as:  ATIVAN Take 1 tablet (0.5 mg total) by mouth 2 (two) times daily as needed for anxiety.   methocarbamol 500 MG tablet Commonly known as:  ROBAXIN Take 1 tablet (500 mg total) by mouth every 8 (eight) hours as needed for muscle spasms.   multivitamin tablet Take 1 tablet by mouth daily.   ondansetron 4 MG tablet Commonly known as:  ZOFRAN Take 1 tablet (4 mg total) by mouth every 8 (eight) hours as needed for nausea or vomiting.   vitamin C 1000 MG tablet Take 1,000 mg by mouth daily.       Allergies:  Allergies  Allergen Reactions  . Sulfa Antibiotics Anaphylaxis  . Penicillins Rash    Past Medical History, Surgical history, Social history, and Family History were reviewed and updated.  Review of Systems: All other 10 point review of systems is negative.   Physical Exam:  height is 5' (1.524 m) and weight is 119 lb (54 kg). Her oral temperature is 98.3 F (36.8 C). Her blood pressure is 104/64 and her  pulse is 66. Her respiration is 16.   Wt Readings from Last 3 Encounters:  09/28/15 119 lb (54 kg)  09/11/15 117 lb 1.6 oz (53.1 kg)  09/01/15 120 lb 8 oz (54.7 kg)    Ocular: Sclerae unicteric, pupils equal, round and reactive to light Ear-nose-throat: Oropharynx clear, dentition fair Lymphatic: No cervical supraclavicular or axillary adenopathy Lungs no rales or rhonchi, good excursion bilaterally Heart regular rate and rhythm, no murmur appreciated Abd soft, nontender, positive bowel sounds MSK no focal spinal tenderness, no joint edema Neuro: non-focal, well-oriented, appropriate affect Breasts: Left breast mastectomy. Well-healed mastectomy scar. There is some tenderness in the lateral aspect of the mastectomy scar. There is no adenopathy in the  left axilla. No changes with the right breast. No mass, lesion, rash or lymphadenopathy found on exam.   Lab Results  Component Value Date   WBC 4.7 09/28/2015   HGB 13.1 09/28/2015   HCT 38.6 09/28/2015   MCV 95 09/28/2015   PLT 198 09/28/2015   No results found for: FERRITIN, IRON, TIBC, UIBC, IRONPCTSAT Lab Results  Component Value Date   RBC 4.08 09/28/2015   No results found for: KPAFRELGTCHN, LAMBDASER, KAPLAMBRATIO No results found for: IGGSERUM, IGA, IGMSERUM No results found for: [name], SPEI   Chemistry      Component Value Date/Time   NA 137 09/10/2015 1523   NA 133 (L) 05/28/2015 1435   NA 141 11/20/2014 1407   K 4.7 09/10/2015 1523   K 3.8 05/28/2015 1435   K 3.8 11/20/2014 1407   CL 102 09/10/2015 1523   CL 101 05/28/2015 1435   CO2 30 09/10/2015 1523   CO2 24 05/28/2015 1435   CO2 27 11/20/2014 1407   BUN 12 09/10/2015 1523   BUN 10 05/28/2015 1435   BUN 13.2 11/20/2014 1407   CREATININE 0.76 09/10/2015 1523   CREATININE 0.57 05/28/2015 1435   CREATININE 0.7 11/20/2014 1407      Component Value Date/Time   CALCIUM 8.6 (L) 09/10/2015 1523   CALCIUM 9.1 05/28/2015 1435   CALCIUM 9.0 11/20/2014 1407   ALKPHOS 47 05/28/2015 1435   ALKPHOS 52 11/20/2014 1407   AST 20 05/28/2015 1435   AST 21 11/20/2014 1407   ALT 11 05/28/2015 1435   ALT 14 11/20/2014 1407   BILITOT 0.5 05/28/2015 1435   BILITOT 0.31 11/20/2014 1407     Impression and Plan: Gail Levine is a very pleasant 50 yo Asian female with history of stage IIIC (T2N3) grade 3 IDC of the left breast diagnosed in 2007. She was triple negative and also BRCA negative. She had a left mastectomy in August 2007 followed by adjuvant chemotherapy with Adriamycin, Cytoxan and Taxol (September 2007 - January 2008). She also received radiation to the chest ( February 2008 - March 2008). So far, she has done well and there has been no evidence of  recurrence.   I try to reassure her from my point of view that I really don't think that this whole chest pain incident was related to any kind of cancer or from her past treatments. I told her that with her chronic cancer, that the risk of recurrence would be within 5 years.   I told her that if she wanted to be on a beta blocker, I would probably recommend metoprolol. I think this would be reasonable. She is going back to her cardiologist to talk to him.   I don't think when  he and echocardiogram on her. Vital Mckeough anything that looks like heart failure.  I want to get her back in 4 months just to follow-up with her. Involves conform was, then we can make follow-up every 6 months.  I spent about 35 minutes with her today.  Volanda Napoleon, MD 8/28/20172:57 PM

## 2015-09-29 LAB — VITAMIN D 25 HYDROXY (VIT D DEFICIENCY, FRACTURES): VIT D 25 HYDROXY: 15 ng/mL — AB (ref 30.0–100.0)

## 2015-09-29 LAB — CANCER ANTIGEN 27-29 (PARALLEL TESTING): CA 27.29: 22 U/mL (ref <38)

## 2015-09-29 LAB — CANCER ANTIGEN 27.29: CAN 27.29: 15.3 U/mL (ref 0.0–38.6)

## 2015-10-01 ENCOUNTER — Ambulatory Visit: Payer: Self-pay | Admitting: Family Medicine

## 2015-10-01 ENCOUNTER — Encounter: Payer: Self-pay | Admitting: Family Medicine

## 2015-10-01 ENCOUNTER — Telehealth: Payer: Self-pay | Admitting: Family Medicine

## 2015-10-01 ENCOUNTER — Ambulatory Visit (INDEPENDENT_AMBULATORY_CARE_PROVIDER_SITE_OTHER): Payer: BLUE CROSS/BLUE SHIELD | Admitting: Family Medicine

## 2015-10-01 DIAGNOSIS — E785 Hyperlipidemia, unspecified: Secondary | ICD-10-CM

## 2015-10-01 DIAGNOSIS — J45909 Unspecified asthma, uncomplicated: Secondary | ICD-10-CM

## 2015-10-01 DIAGNOSIS — E876 Hypokalemia: Secondary | ICD-10-CM

## 2015-10-01 DIAGNOSIS — R0602 Shortness of breath: Secondary | ICD-10-CM | POA: Diagnosis not present

## 2015-10-01 DIAGNOSIS — R002 Palpitations: Secondary | ICD-10-CM | POA: Diagnosis not present

## 2015-10-01 DIAGNOSIS — E559 Vitamin D deficiency, unspecified: Secondary | ICD-10-CM

## 2015-10-01 MED ORDER — LEVALBUTEROL TARTRATE 45 MCG/ACT IN AERO: 1.0000 | INHALATION_SPRAY | Freq: Four times a day (QID) | RESPIRATORY_TRACT | 5 refills | Status: DC | PRN

## 2015-10-01 NOTE — Assessment & Plan Note (Signed)
Will switch from Albuterol to Xopenex due to cardiac condition.

## 2015-10-01 NOTE — Telephone Encounter (Signed)
xopenex for this patients needs to be brand name and not generic.

## 2015-10-01 NOTE — Assessment & Plan Note (Signed)
Is now established with cardiology Dr Janeal Holmes at [facility] clinic, will get records was recently hospitalized and work up was negative for acute event but her pulse if very labile from 40 to 120. Cardiology is considering beta blockade but is concerned about albuterol use.

## 2015-10-01 NOTE — Telephone Encounter (Signed)
Rewrite it daw I guess that is what they mean. They do have permission to fill either but go ahead and rewrite.

## 2015-10-01 NOTE — Progress Notes (Signed)
Pre visit review using our clinic review tool, if applicable. No additional management support is needed unless otherwise documented below in the visit note. 

## 2015-10-01 NOTE — Patient Instructions (Signed)
Paroxysmal Supraventricular Tachycardia Paroxysmal supraventricular tachycardia (PSVT) is a type of abnormal heart rhythm. It causes your heart to beat very quickly and then suddenly stop beating so quickly. A normal heart rate is 60-100 beats per minute. During an episode of PSVT, your heart rate may be 150-250 beats per minute. This can make you feel light-headed and short of breath. An episode of PSVT can be frightening. It is usually not dangerous. The heart has four chambers. All chambers need to work together for the heart to beat effectively. A normal heartbeat usually starts in the right upper chamber of the heart (atrium) when an area (sinoatrial node) puts out an electrical signal that spreads to the other chambers. People with PSVT may have abnormal electrical pathways, or they may have other areas in the upper chambers that send out electrical signals. The result is a very rapid heartbeat. When your heart beats very quickly, it does not have time to fill completely with blood. When PSVT happens often or it lasts for long periods, it can lead to heart weakness and failure. Most people with PSVT do not have any other heart disease. CAUSES Abnormal electrical activity in the heart causes PSVT. It is not known why some people get PSVT and others do not. RISK FACTORS You may be more likely to have PSVT if:  You are 20-30 years old.  You are a woman. Other factors that may increase your chances of an attack include:  Stress.  Being tired.  Smoking.  Stimulant drugs.  Alcoholic drinks.  Caffeine.  Pregnancy. SIGNS AND SYMPTOMS A mild episode of PSVT may cause no symptoms. If you do have signs and symptoms, they may include:  A pounding heart.  Feeling of skipped heartbeats (palpitations).  Weakness.  Shortness of breath.  Tightness or pain in your chest.  Light-headedness.  Anxiety.  Dizziness.  Sweating.  Nausea.  A fainting spell. DIAGNOSIS Your health care  provider may suspect PSVT if you have symptoms that come and go. The health care provider will do a physical exam. If you are having an episode during the exam, the health care provider may be able to diagnose PSVT by listening to your heart and feeling your pulse. Tests may also be done, including:  An electrical study of your heart (electrocardiogram, or ECG).  A test in which you wear a portable ECG monitor all day (Holter monitor) or for several days (event monitor).  A test that involves taking an image of your heart using sound waves (echocardiogram) to rule out other causes of a fast heart rate. TREATMENT You may not need treatment if episodes of PSVT do not happen often or if they do not cause symptoms. If PSVT episodes do cause symptoms, your health care provider may first suggest trying a self-treatment called vagus nerve stimulation. The vagus nerve extends down from the brain. It regulates certain body functions. Stimulating this nerve can slow down the heart. Your health care provider can teach you ways to do this. You may need to try a few ways to find what works best for you. Options include:  Holding your breath and pushing, as though you are having a bowel movement.  Massaging an area on one side of your neck below your jaw.  Bending forward with your head between your legs.  Bending forward with your head between your legs and coughing.  Massaging your eyeballs with your eyes closed. If vagus nerve stimulation does not work, other treatment options include:    Medicines to prevent an attack.  Being treated in the hospital with medicine or electric shock to stop an attack (cardioversion). This treatment can include:  Getting medicine through an IV line.  Having a small electric shock delivered to your heart. You will be given medicine to make you sleep through this procedure.  If you have frequent episodes with symptoms, you may need a procedure to get rid of the faulty  areas of your heart (radiofrequency ablation) and end the episodes of PSVT. In this procedure:  A long, thin tube (catheter) is passed through one of your veins into your heart.  Energy directed through the catheter eliminates the areas of your heart that are causing abnormal electric stimulation. HOME CARE INSTRUCTIONS  Take medicines only as directed by your health care provider.  Do not use caffeine in any form if caffeine triggers episodes of PSVT. Otherwise, consume caffeine in moderation. This means no more than a few cups of coffee or the equivalent each day.  Do not drink alcohol if alcohol triggers episodes of PSVT. Otherwise, limit alcohol intake to no more than 1 drink per day for nonpregnant women and 2 drinks per day for men. One drink equals 12 ounces of beer, 5 ounces of wine, or 1 ounces of hard liquor.  Do not use any tobacco products, including cigarettes, chewing tobacco, or electronic cigarettes. If you need help quitting, ask your health care provider.  Try to get at least 7 hours of sleep each night.  Find healthy ways to manage stress.  Perform vagus nerve stimulation as directed by your health care provider.  Maintain a healthy weight.  Get some exercise on most days. Ask your health care provider to suggest some good activities for you. SEEK MEDICAL CARE IF:  You are having episodes of PSVT more often, or they are lasting longer.  Vagus nerve stimulation is no longer helping.  You have new symptoms during an episode. SEEK IMMEDIATE MEDICAL CARE IF:  You have chest pain or trouble breathing.  You have an episode of PSVT that has lasted longer than 20 minutes.  You have passed out from an episode of PSVT. These symptoms may represent a serious problem that is an emergency. Do not wait to [place] if the symptoms will go away. Get medical help right away. Call your local emergency services (911 in the [place] Do not drive yourself to the hospital.   This  information is not intended to replace advice given to you by your health care provider. Make sure you discuss any questions you have with your health care provider.   Document Released: 01/17/2005 Document Revised: 02/07/2014 Document Reviewed: 06/27/2013 Elsevier Interactive Patient Education 2016 Elsevier Inc.  

## 2015-10-02 ENCOUNTER — Encounter: Payer: Self-pay | Admitting: Family Medicine

## 2015-10-02 ENCOUNTER — Institutional Professional Consult (permissible substitution): Payer: BLUE CROSS/BLUE SHIELD | Admitting: Pulmonary Disease

## 2015-10-02 ENCOUNTER — Other Ambulatory Visit: Payer: Self-pay | Admitting: Family

## 2015-10-02 DIAGNOSIS — E559 Vitamin D deficiency, unspecified: Secondary | ICD-10-CM

## 2015-10-02 DIAGNOSIS — C50212 Malignant neoplasm of upper-inner quadrant of left female breast: Secondary | ICD-10-CM

## 2015-10-02 MED ORDER — LEVALBUTEROL TARTRATE 45 MCG/ACT IN AERO: 1.0000 | INHALATION_SPRAY | Freq: Four times a day (QID) | RESPIRATORY_TRACT | 5 refills | Status: DC | PRN

## 2015-10-02 MED ORDER — ERGOCALCIFEROL 1.25 MG (50000 UT) PO CAPS: 50000.0000 [IU] | ORAL_CAPSULE | ORAL | 4 refills | Status: DC

## 2015-10-02 NOTE — Progress Notes (Signed)
Vit D deficiency confirmed. Will have patient start taking 50,000 unit PO supplement once a week. Will recheck in a month or so.

## 2015-10-06 ENCOUNTER — Telehealth: Payer: Self-pay | Admitting: *Deleted

## 2015-10-06 NOTE — Telephone Encounter (Signed)
-----   Message from Eliezer Bottom, NP sent at 10/02/2015  3:27 PM EDT ----- Regarding: Vit D  Vit D is quite low. Prescription sent to [facility] for Vit D supplement 50,000 units PO once a week. Thank you!!!  [name]  ----- Message ----- From: Interface, Lab In Three Zero One Sent: 09/28/2015   2:22 PM To: Eliezer Bottom, NP

## 2015-10-09 ENCOUNTER — Encounter: Payer: Self-pay | Admitting: Family Medicine

## 2015-10-11 NOTE — Assessment & Plan Note (Signed)
Labs reveal deficiency. Start on Vitamin D 50000 IU caps, 1 cap po weekly x 12 weeks. Disp #4 with 4 rf. Also take daily Vitamin D over the counter. If already taking a daily supplement increase by 1000 IU daily and if not start Vitamin D 2000 IU daily.  

## 2015-10-11 NOTE — Assessment & Plan Note (Signed)
Encouraged heart healthy diet, increase exercise, avoid trans fats, consider a krill oil cap daily 

## 2015-10-11 NOTE — Progress Notes (Signed)
Patient ID: Gail Levine, female   DOB: Jan 01, 1966, 50 y.o.   MRN: [id number]   Subjective:    Patient ID: Gail Levine, female    DOB: 01-06-1966, 50 y.o.   MRN: [id number]  Chief Complaint  Patient presents with  . Follow-up    HPI Patient is in today for follow up. She feels well today but has had a very difficult month. Is now established with cardiology Dr Janeal Holmes at [facility] clinic, will get records was recently hospitalized and work up was negative for acute event but her pulse is very labile from 40 to 120. Cardiology is considering beta blockade but is concerned about albuterol use. Denies CP/palp/SOB/HA/congestion/fevers/GI or GU c/o. Taking meds as prescribed  Past Medical History:  Diagnosis Date  . Acute bronchitis 09/09/2015  . Allergy   . Asthma   . Asthma 09/09/2015  . Breast cancer, left breast ([place])    2007   . Cancer ([place])    2007  . Cervical cancer screening 01/01/2015  . Dermatitis 01/04/2015  . Estrogen deficient vulvovaginitis 09/14/2014  . HIV infection ([place])   . Hyperlipidemia   . Positive PPD   . Pruritus 09/04/2014  . Thyroid disease   . Vitamin D deficiency     Past Surgical History:  Procedure Laterality Date  . MASTECTOMY     left breast    Family History  Problem Relation Age of Onset  . Cancer Mother     cancer  . Hypertension Father   . Stroke Father   . Heart disease Father   . Diabetes Father     Social History   Social History  . Marital status: Single    Spouse name: N/A  . Number of children: N/A  . Years of education: N/A   Occupational History  . patient care coordinator    Social History Main Topics  . Smoking status: Never Smoker  . Smokeless tobacco: Never Used  . Alcohol use 0.0 oz/week  . Drug use: No  . Sexual activity: No   Other Topics Concern  . Not on file   Social History Narrative  . No narrative on file    Outpatient Medications Prior to Visit  Medication Sig Dispense Refill  . Ascorbic Acid (VITAMIN  C) 1000 MG tablet Take 1,000 mg by mouth daily.    Marland Kitchen b complex vitamins tablet Take 1 tablet by mouth daily.    . calcium carbonate (OS-CAL) 600 MG TABS tablet Take 1,800 mg by mouth 2 (two) times daily with a meal.    . cetirizine (ZYRTEC) 10 MG tablet Take 10 mg by mouth daily.    . diphenhydrAMINE (BENADRYL) 25 MG tablet Take 25 mg by mouth every 6 (six) hours as needed for itching.     Marland Kitchen HYDROcodone-acetaminophen (NORCO) 5-325 MG tablet Take 1 tablet by mouth every 6 (six) hours as needed for moderate pain. 8 tablet 0  . HYDROcodone-homatropine (HYCODAN) 5-1.5 MG/5ML syrup Take 5 mLs by mouth every 8 (eight) hours as needed for cough. 120 mL 0  . LORazepam (ATIVAN) 0.5 MG tablet Take 1 tablet (0.5 mg total) by mouth 2 (two) times daily as needed for anxiety. 10 tablet 1  . methocarbamol (ROBAXIN) 500 MG tablet Take 1 tablet (500 mg total) by mouth every 8 (eight) hours as needed for muscle spasms. 30 tablet 1  . Multiple Vitamin (MULTIVITAMIN) tablet Take 1 tablet by mouth daily.    . ondansetron (ZOFRAN) 4 MG tablet Take 1  tablet (4 mg total) by mouth every 8 (eight) hours as needed for nausea or vomiting. 30 tablet 1  . albuterol (PROVENTIL HFA;VENTOLIN HFA) 108 (90 Base) MCG/ACT inhaler Inhale 1 puff into the lungs every 6 (six) hours as needed for wheezing or shortness of breath. 1 Inhaler 6   No facility-administered medications prior to visit.     Allergies  Allergen Reactions  . Sulfa Antibiotics Anaphylaxis  . Penicillins Rash    Review of Systems  Constitutional: Negative for fever and malaise/fatigue.  HENT: Negative for congestion.   Eyes: Negative for blurred vision.  Respiratory: Negative for shortness of breath.   Cardiovascular: Positive for palpitations. Negative for chest pain and leg swelling.  Gastrointestinal: Negative for abdominal pain, blood in stool and nausea.  Genitourinary: Negative for dysuria and frequency.  Musculoskeletal: Negative for falls.  Skin:  Negative for rash.  Neurological: Negative for dizziness, loss of consciousness and headaches.  Endo/Heme/Allergies: Negative for environmental allergies.  Psychiatric/Behavioral: Negative for depression. The patient is nervous/anxious.        Objective:    Physical Exam  Constitutional: She is oriented to person, place, and time. She appears well-developed and well-nourished. No distress.  HENT:  Head: Normocephalic and atraumatic.  Nose: Nose normal.  Eyes: Right eye exhibits no discharge. Left eye exhibits no discharge.  Neck: Normal range of motion. Neck supple.  Cardiovascular: Normal rate and regular rhythm.   No murmur heard. Pulmonary/Chest: Effort normal and breath sounds normal.  Abdominal: Soft. Bowel sounds are normal. There is no tenderness.  Musculoskeletal: She exhibits no edema.  Neurological: She is alert and oriented to person, place, and time.  Skin: Skin is warm and dry.  Psychiatric: She has a normal mood and affect.  Nursing note and vitals reviewed.   There were no vitals taken for this visit. Wt Readings from Last 3 Encounters:  09/28/15 119 lb (54 kg)  09/11/15 117 lb 1.6 oz (53.1 kg)  09/01/15 120 lb 8 oz (54.7 kg)     Lab Results  Component Value Date   WBC 4.7 09/28/2015   HGB 13.1 09/28/2015   HCT 38.6 09/28/2015   PLT 198 09/28/2015   GLUCOSE 110 09/28/2015   CHOL 184 09/04/2014   TRIG 75.0 09/04/2014   HDL 66.50 09/04/2014   LDLCALC 102 (H) 09/04/2014   ALT 11 09/28/2015   AST 21 09/28/2015   NA 143 09/28/2015   K 3.4 (L) 09/28/2015   CL 102 09/10/2015   CREATININE 0.7 09/28/2015   BUN 7.5 09/28/2015   CO2 24 09/28/2015   TSH 2.72 09/04/2014    Lab Results  Component Value Date   TSH 2.72 09/04/2014   Lab Results  Component Value Date   WBC 4.7 09/28/2015   HGB 13.1 09/28/2015   HCT 38.6 09/28/2015   MCV 95 09/28/2015   PLT 198 09/28/2015   Lab Results  Component Value Date   NA 143 09/28/2015   K 3.4 (L)  09/28/2015   CHLORIDE 108 09/28/2015   CO2 24 09/28/2015   GLUCOSE 110 09/28/2015   BUN 7.5 09/28/2015   CREATININE 0.7 09/28/2015   BILITOT 0.55 09/28/2015   ALKPHOS 54 09/28/2015   AST 21 09/28/2015   ALT 11 09/28/2015   PROT 8.0 09/28/2015   ALBUMIN 3.6 09/28/2015   CALCIUM 8.9 09/28/2015   ANIONGAP 11 09/28/2015   EGFR >90 09/28/2015   GFR 95.43 09/04/2014   Lab Results  Component Value Date   CHOL 184  09/04/2014   Lab Results  Component Value Date   HDL 66.50 09/04/2014   Lab Results  Component Value Date   LDLCALC 102 (H) 09/04/2014   Lab Results  Component Value Date   TRIG 75.0 09/04/2014   Lab Results  Component Value Date   CHOLHDL 3 09/04/2014   No results found for: HGBA1C     Assessment & Plan:   Problem List Items Addressed This Visit    Vitamin D deficiency    Labs reveal deficiency. Start on Vitamin D 50000 IU caps, 1 cap po weekly x 12 weeks. Disp #4 with 4 rf. Also take daily Vitamin D over the counter. If already taking a daily supplement increase by 1000 IU daily and if not start Vitamin D 2000 IU daily.       Hyperlipidemia    Encouraged heart healthy diet, increase exercise, avoid trans fats, consider a krill oil cap daily      Palpitations    Is now established with cardiology Dr Janeal Holmes at [facility] clinic, will get records was recently hospitalized and work up was negative for acute event but her pulse if very labile from 40 to 120. Cardiology is considering beta blockade but is concerned about albuterol use.      Asthma    Will switch from Albuterol to Xopenex due to cardiac condition.      Relevant Orders   Ambulatory referral to Pulmonology    Other Visit Diagnoses    SOB (shortness of breath)    -  Primary   Relevant Orders   Ambulatory referral to Pulmonology   Hypokalemia       Relevant Orders   CMP14+CBC/D/Plt+TSH      I have discontinued Ms. Stroder's albuterol. I am also having her maintain her multivitamin,  vitamin C, calcium carbonate, cetirizine, b complex vitamins, diphenhydrAMINE, LORazepam, methocarbamol, HYDROcodone-acetaminophen, ondansetron, and HYDROcodone-homatropine.  Meds ordered this encounter  Medications  . DISCONTD: levalbuterol (XOPENEX HFA) 45 MCG/ACT inhaler    Sig: Inhale 1 puff into the lungs every 6 (six) hours as needed for wheezing or shortness of breath. Failed Albuterol due to cardiac disease and instability    Dispense:  1 Inhaler    Refill:  5     Penni Homans, MD

## 2015-10-12 ENCOUNTER — Other Ambulatory Visit: Payer: Self-pay | Admitting: Family Medicine

## 2015-10-15 ENCOUNTER — Other Ambulatory Visit: Payer: Self-pay | Admitting: Family Medicine

## 2015-10-15 ENCOUNTER — Ambulatory Visit (HOSPITAL_BASED_OUTPATIENT_CLINIC_OR_DEPARTMENT_OTHER)
Admission: RE | Admit: 2015-10-15 | Discharge: 2015-10-15 | Disposition: A | Discharge status: Home / Self Care | Payer: BLUE CROSS/BLUE SHIELD | Source: Ambulatory Visit | Attending: Family Medicine | Admitting: Family Medicine

## 2015-10-15 DIAGNOSIS — R921 Mammographic calcification found on diagnostic imaging of breast: Secondary | ICD-10-CM | POA: Insufficient documentation

## 2015-10-15 DIAGNOSIS — L509 Urticaria, unspecified: Secondary | ICD-10-CM | POA: Diagnosis not present

## 2015-10-15 DIAGNOSIS — Z1231 Encounter for screening mammogram for malignant neoplasm of breast: Secondary | ICD-10-CM

## 2015-10-15 DIAGNOSIS — L811 Chloasma: Secondary | ICD-10-CM | POA: Diagnosis not present

## 2015-10-22 ENCOUNTER — Other Ambulatory Visit: Payer: Self-pay | Admitting: Family Medicine

## 2015-10-22 DIAGNOSIS — R928 Other abnormal and inconclusive findings on diagnostic imaging of breast: Secondary | ICD-10-CM

## 2015-11-03 ENCOUNTER — Encounter: Payer: Self-pay | Admitting: Pulmonary Disease

## 2015-11-03 ENCOUNTER — Ambulatory Visit (INDEPENDENT_AMBULATORY_CARE_PROVIDER_SITE_OTHER): Payer: BLUE CROSS/BLUE SHIELD | Admitting: Pulmonary Disease

## 2015-11-03 ENCOUNTER — Other Ambulatory Visit: Payer: BLUE CROSS/BLUE SHIELD

## 2015-11-03 VITALS — BP 114/78 | HR 70 | Ht 60.0 in | Wt 120.4 lb

## 2015-11-03 DIAGNOSIS — R0602 Shortness of breath: Secondary | ICD-10-CM | POA: Diagnosis not present

## 2015-11-03 NOTE — Progress Notes (Signed)
   Subjective:    Patient ID: Reginald Faley, female    DOB: 1965-11-23, 50 y.o.   MRN: [id number]  HPI    Review of Systems  Constitutional: Negative for chills, fever and unexpected weight change.  HENT: Negative for congestion, dental problem, ear pain, nosebleeds, postnasal drip, rhinorrhea, sinus pressure, sneezing, sore throat, trouble swallowing and voice change.   Eyes: Negative for visual disturbance.  Respiratory: Positive for cough, shortness of breath and wheezing. Negative for choking.   Cardiovascular: Negative for chest pain and leg swelling.  Gastrointestinal: Negative for abdominal pain, diarrhea and vomiting.  Genitourinary: Negative for difficulty urinating.  Musculoskeletal: Negative for arthralgias.  Skin: Negative for rash.  Neurological: Negative for tremors, syncope and headaches.  Hematological: Does not bruise/bleed easily.       Objective:   Physical Exam        Assessment & Plan:

## 2015-11-03 NOTE — Patient Instructions (Addendum)
Okay to take Xopenex 1-2 puffs as needed for wheezing or  attack of shortness of breath  Breathing test appears normal  Blood tests for allergies today

## 2015-11-03 NOTE — Assessment & Plan Note (Signed)
I'm not sure if beta blocker is indicated from cardiology standpoint-but it would be okay to use if necessary, we can monitor her for worsening attacks of dyspnea

## 2015-11-03 NOTE — Assessment & Plan Note (Signed)
I doubt that she has true asthma. Her paroxysms seem to be very brief and resolve fairly quickly-to qualify her for reactive airway disease. Of wonder if she has some degree of vocal cord dysfunction  At this time, Okay to take Xopenex 1-2 puffs as needed for wheezing or  attack of shortness of breath  Breathing test appears normal  Check RAST

## 2015-11-03 NOTE — Progress Notes (Signed)
Subjective:    Patient ID: Gail Levine, female    DOB: Jun 24, 1965, 50 y.o.   MRN: [id number]  HPI  Chief Complaint  Patient presents with  . Pulmonary Consult    Referred by Dr. Charlett Blake; Asthma.  PSVT diagnosed 10/15/15.  SOB.      50 year old never smoker originally from [place] presents for evaluation of asthma-specifically to reevaluate the need for MDI. She denies a childhood history of asthma. She emigrated from [place] at age 50, lived in [place] for about 25 years before moving to [place] about 4 years ago. She reports a diagnosis of asthma made 4 years ago but she wonders if her symptoms are mainly related to allergies and her family doctor made a history decision to put her on albuterol. She denies wheezing or nocturnal symptoms. She denies year visits or hospitalizations due to asthma. She does report episodes of sudden shortness of breath and chest tightness, with sometimes resolves spontaneously in a few minutes, at other times resolved immediately after taking a puff of albuterol-again she has no wheezing during these episodes.  The last 3 weeks she has been using Xopenex-she may have used this about 6 times in that duration-never more than once per day. She states that during her paroxysms, her sister may have to pat on her back to clear her chest. She also reports that Xopenex drops her blood pressure.  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  She was seen in [facility] ED for chest pain and palpitations, EKG and troponins were negative. She was seen by cardiology at [facility] and underwent an exercise stress test which was negative for ischemia. She underwent a Holter testing and she reports that she was told that her heart rate went as high as 180 and as low as 35 and she was told that she has tachybradycardia syndrome. I have reviewed her [facility] records and did not Tabar any mention of this. I do note that beta blocker was not given due to resting bradycardia  She reports a  history of left breast cancer for which she received chemotherapy (Adriamycin) and radiation. She is worried about effects on the lung and heart.   Significant tests/ events  Chest x-ray 09/2015 shows hyperinflation and is clear of infiltrates CT sinuses 07/2015 does not show any evidence of sinusitis Spirometry       Past Medical History:  Diagnosis Date  . Acute bronchitis 09/09/2015  . Allergy   . Asthma   . Asthma 09/09/2015  . Breast cancer, left breast ([place])    2007   . Cancer ([place])    2007  . Cervical cancer screening 01/01/2015  . Dermatitis 01/04/2015  . Estrogen deficient vulvovaginitis 09/14/2014  . HIV infection ([place])   . Hyperlipidemia   . Positive PPD   . Pruritus 09/04/2014  . Thyroid disease   . Vitamin D deficiency    Past Surgical History:  Procedure Laterality Date  . MASTECTOMY     left breast     Allergies  Allergen Reactions  . Sulfa Antibiotics Anaphylaxis  . Penicillins Rash     Social History   Social History  . Marital status: Single    Spouse name: N/A  . Number of children: N/A  . Years of education: N/A   Occupational History  . patient care coordinator    Social History Main Topics  . Smoking status: Never Smoker  . Smokeless tobacco: Never Used  . Alcohol use 0.0 oz/week  .  Drug use: No  . Sexual activity: No   Other Topics Concern  . Not on file   Social History Narrative  . No narrative on file     Family History  Problem Relation Age of Onset  . Cancer Mother     cancer  . Hypertension Father   . Stroke Father   . Heart disease Father   . Diabetes Father      Review of Systems neg for any significant sore throat, dysphagia, itching, sneezing, nasal congestion or excess/ purulent secretions, fever, chills, sweats, unintended wt loss, pleuritic or exertional cp, hempoptysis, orthopnea pnd or change in chronic leg swelling.   Also denies presyncope, palpitations, heartburn, abdominal pain, nausea, vomiting,  diarrhea or change in bowel or urinary habits, dysuria,hematuria, rash, arthralgias, visual complaints, headache, numbness weakness or ataxia.     Objective:   Physical Exam  Gen. Pleasant, well-nourished, thin, in no distress, normal affect ENT - no lesions, no post nasal drip Neck: No JVD, no thyromegaly, no carotid bruits Lungs: no use of accessory muscles, no dullness to percussion, clear without rales or rhonchi  Cardiovascular: Rhythm regular, heart sounds  normal, no murmurs or gallops, no peripheral edema Abdomen: soft and non-tender, no hepatosplenomegaly, BS normal. Musculoskeletal: No deformities, no cyanosis or clubbing Neuro:  alert, non focal        Assessment & Plan:

## 2015-11-04 LAB — RESPIRATORY ALLERGY PROFILE REGION II ~~LOC~~
Allergen, A. alternata, m6: <0.1 kU/L
Allergen, C. Herbarum, M2: <0.1 kU/L
Allergen, D pternoyssinus,d7: <0.1 kU/L
Allergen, Mulberry, t76: <0.1 kU/L
Aspergillus fumigatus, m3: <0.1 kU/L
Bermuda Grass: <0.1 kU/L
Box Elder IgE: <0.1 kU/L
Cockroach: <0.1 kU/L
D. farinae: <0.1 kU/L
Dog Dander: <0.1 kU/L
Elm IgE: <0.1 kU/L
IGE (IMMUNOGLOBULIN E), SERUM: 12 kU/L (ref <115)
Johnson Grass: <0.1 kU/L
Sheep Sorrel IgE: <0.1 kU/L

## 2015-11-05 ENCOUNTER — Other Ambulatory Visit: Payer: BLUE CROSS/BLUE SHIELD

## 2015-11-05 ENCOUNTER — Ambulatory Visit: Payer: BLUE CROSS/BLUE SHIELD | Admitting: Hematology & Oncology

## 2015-11-13 DIAGNOSIS — R079 Chest pain, unspecified: Secondary | ICD-10-CM | POA: Diagnosis not present

## 2015-11-13 DIAGNOSIS — R0602 Shortness of breath: Secondary | ICD-10-CM | POA: Diagnosis not present

## 2015-11-13 DIAGNOSIS — R002 Palpitations: Secondary | ICD-10-CM | POA: Diagnosis not present

## 2015-11-23 ENCOUNTER — Encounter: Payer: Self-pay | Admitting: Family Medicine

## 2015-11-23 ENCOUNTER — Ambulatory Visit (INDEPENDENT_AMBULATORY_CARE_PROVIDER_SITE_OTHER): Payer: BLUE CROSS/BLUE SHIELD | Admitting: Family Medicine

## 2015-11-23 VITALS — BP 110/80 | HR 69 | Temp 98.1°F | Ht 60.0 in | Wt 120.0 lb

## 2015-11-23 DIAGNOSIS — E559 Vitamin D deficiency, unspecified: Secondary | ICD-10-CM

## 2015-11-23 DIAGNOSIS — E785 Hyperlipidemia, unspecified: Secondary | ICD-10-CM | POA: Diagnosis not present

## 2015-11-23 DIAGNOSIS — I10 Essential (primary) hypertension: Secondary | ICD-10-CM | POA: Diagnosis not present

## 2015-11-23 DIAGNOSIS — C50212 Malignant neoplasm of upper-inner quadrant of left female breast: Secondary | ICD-10-CM

## 2015-11-23 DIAGNOSIS — K5900 Constipation, unspecified: Secondary | ICD-10-CM

## 2015-11-23 HISTORY — DX: Essential (primary) hypertension: I10

## 2015-11-23 HISTORY — DX: Constipation, unspecified: K59.00

## 2015-11-23 NOTE — Patient Instructions (Signed)
Hypertension Hypertension, commonly called high blood pressure, is when the force of blood pumping through your arteries is too strong. Your arteries are the blood vessels that carry blood from your heart throughout your body. A blood pressure reading consists of a higher number over a lower number, such as 110/72. The higher number (systolic) is the pressure inside your arteries when your heart pumps. The lower number (diastolic) is the pressure inside your arteries when your heart relaxes. Ideally you want your blood pressure below 120/80. Hypertension forces your heart to work harder to pump blood. Your arteries may become narrow or stiff. Having untreated or uncontrolled hypertension can cause heart attack, stroke, kidney disease, and other problems. RISK FACTORS Some risk factors for high blood pressure are controllable. Others are not.  Risk factors you cannot control include:   Race. You may be at higher risk if you are African American.  Age. Risk increases with age.  Gender. Men are at higher risk than women before age 45 years. After age 65, women are at higher risk than men. Risk factors you can control include:  Not getting enough exercise or physical activity.  Being overweight.  Getting too much fat, sugar, calories, or salt in your diet.  Drinking too much alcohol. SIGNS AND SYMPTOMS Hypertension does not usually cause signs or symptoms. Extremely high blood pressure (hypertensive crisis) may cause headache, anxiety, shortness of breath, and nosebleed. DIAGNOSIS To check if you have hypertension, your health care provider will measure your blood pressure while you are seated, with your arm held at the level of your heart. It should be measured at least twice using the same arm. Certain conditions can cause a difference in blood pressure between your right and left arms. A blood pressure reading that is higher than normal on one occasion does not mean that you need treatment. If  it is not clear whether you have high blood pressure, you may be asked to return on a different day to have your blood pressure checked again. Or, you may be asked to monitor your blood pressure at home for 1 or more weeks. TREATMENT Treating high blood pressure includes making lifestyle changes and possibly taking medicine. Living a healthy lifestyle can help lower high blood pressure. You may need to change some of your habits. Lifestyle changes may include:  Following the DASH diet. This diet is high in fruits, vegetables, and whole grains. It is low in salt, red meat, and added sugars.  Keep your sodium intake below 2,300 mg per day.  Getting at least 30-45 minutes of aerobic exercise at least 4 times per week.  Losing weight if necessary.  Not smoking.  Limiting alcoholic beverages.  Learning ways to reduce stress. Your health care provider may prescribe medicine if lifestyle changes are not enough to get your blood pressure under control, and if one of the following is true:  You are 18-59 years of age and your systolic blood pressure is above 140.  You are 60 years of age or older, and your systolic blood pressure is above 150.  Your diastolic blood pressure is above 90.  You have diabetes, and your systolic blood pressure is over 140 or your diastolic blood pressure is over 90.  You have kidney disease and your blood pressure is above 140/90.  You have heart disease and your blood pressure is above 140/90. Your personal target blood pressure may vary depending on your medical conditions, your age, and other factors. HOME CARE INSTRUCTIONS    Have your blood pressure rechecked as directed by your health care provider.   Take medicines only as directed by your health care provider. Follow the directions carefully. Blood pressure medicines must be taken as prescribed. The medicine does not work as well when you skip doses. Skipping doses also puts you at risk for  problems.  Do not smoke.   Monitor your blood pressure at home as directed by your health care provider. SEEK MEDICAL CARE IF:   You think you are having a reaction to medicines taken.  You have recurrent headaches or feel dizzy.  You have swelling in your ankles.  You have trouble with your vision. SEEK IMMEDIATE MEDICAL CARE IF:  You develop a severe headache or confusion.  You have unusual weakness, numbness, or feel faint.  You have severe chest or abdominal pain.  You vomit repeatedly.  You have trouble breathing. MAKE SURE YOU:   Understand these instructions.  Will watch your condition.  Will get help right away if you are not doing well or get worse.   This information is not intended to replace advice given to you by your health care provider. Make sure you discuss any questions you have with your health care provider.   Document Released: 01/17/2005 Document Revised: 06/03/2014 Document Reviewed: 11/09/2012 Elsevier Interactive Patient Education 2016 Elsevier Inc.  

## 2015-11-23 NOTE — Progress Notes (Signed)
Patient ID: Gail Levine, female   DOB: November 17, 1965, 50 y.o.   MRN: [id number]   Subjective:    Patient ID: Gail Levine, female    DOB: Nov 24, 1965, 50 y.o.   MRN: [id number]  Chief Complaint  Patient presents with  . Follow-up    HPI Patient is in today for follow up. She is now following with cardiology and pulmonology and doing well. Is using hte Xopenex often and has discussed with pulmonology the need for less use and deep breathing exercises before using. Notes some constipation and fluid retention since starting Diltiazem. Denies CP/palp/SOB/HA/congestion/fevers orGU c/o. Taking meds as prescribed  Past Medical History:  Diagnosis Date  . Acute bronchitis 09/09/2015  . Allergy   . Asthma   . Asthma 09/09/2015  . Breast cancer, left breast ([place])    2007   . Cancer ([place])    2007  . Cervical cancer screening 01/01/2015  . Constipation 11/23/2015  . Dermatitis 01/04/2015  . Estrogen deficient vulvovaginitis 09/14/2014  . HIV infection ([place])   . HTN (hypertension), benign 11/23/2015  . Hyperlipidemia   . Positive PPD   . Pruritus 09/04/2014  . Thyroid disease   . Vitamin D deficiency     Past Surgical History:  Procedure Laterality Date  . MASTECTOMY     left breast    Family History  Problem Relation Age of Onset  . Cancer Mother     cancer  . Hypertension Father   . Stroke Father   . Heart disease Father   . Diabetes Father     Social History   Social History  . Marital status: Single    Spouse name: N/A  . Number of children: N/A  . Years of education: N/A   Occupational History  . patient care coordinator    Social History Main Topics  . Smoking status: Never Smoker  . Smokeless tobacco: Never Used  . Alcohol use 0.0 oz/week  . Drug use: No  . Sexual activity: No   Other Topics Concern  . Not on file   Social History Narrative  . No narrative on file    Outpatient Medications Prior to Visit  Medication Sig Dispense Refill  . Ascorbic Acid (VITAMIN C)  1000 MG tablet Take 1,000 mg by mouth daily.    Marland Kitchen b complex vitamins tablet Take 1 tablet by mouth daily.    . calcium carbonate (OS-CAL) 600 MG TABS tablet Take 1,800 mg by mouth 2 (two) times daily with a meal.    . cetirizine (ZYRTEC) 10 MG tablet Take 10 mg by mouth daily.    . diphenhydrAMINE (BENADRYL) 25 MG tablet Take 25 mg by mouth every 6 (six) hours as needed for itching.     . ergocalciferol (VITAMIN D2) 50000 units capsule Take 1 capsule (50,000 Units total) by mouth once a week. 4 capsule 4  . HYDROcodone-acetaminophen (NORCO) 5-325 MG tablet Take 1 tablet by mouth every 6 (six) hours as needed for moderate pain. 8 tablet 0  . HYDROcodone-homatropine (HYCODAN) 5-1.5 MG/5ML syrup Take 5 mLs by mouth every 8 (eight) hours as needed for cough. 120 mL 0  . levalbuterol (XOPENEX HFA) 45 MCG/ACT inhaler Inhale 1 puff into the lungs every 6 (six) hours as needed for wheezing or shortness of breath. Fill as daw. They do have permission to fill either but go ahead and rewrite. 1 Inhaler 5  . LORazepam (ATIVAN) 0.5 MG tablet Take 1 tablet (0.5 mg total) by mouth 2 (two)  times daily as needed for anxiety. 10 tablet 1  . methocarbamol (ROBAXIN) 500 MG tablet Take 1 tablet (500 mg total) by mouth every 8 (eight) hours as needed for muscle spasms. 30 tablet 1  . Multiple Vitamin (MULTIVITAMIN) tablet Take 1 tablet by mouth daily.    . ondansetron (ZOFRAN) 4 MG tablet Take 1 tablet (4 mg total) by mouth every 8 (eight) hours as needed for nausea or vomiting. 30 tablet 1   No facility-administered medications prior to visit.     Allergies  Allergen Reactions  . Sulfa Antibiotics Anaphylaxis  . Penicillins Rash    Review of Systems  Constitutional: Negative for fever and malaise/fatigue.  HENT: Negative for congestion.   Eyes: Negative for blurred vision.  Respiratory: Negative for shortness of breath.   Cardiovascular: Negative for chest pain, palpitations and leg swelling.    Gastrointestinal: Positive for constipation. Negative for abdominal pain, blood in stool and nausea.  Genitourinary: Negative for dysuria and frequency.  Musculoskeletal: Negative for falls.  Skin: Negative for rash.  Neurological: Negative for dizziness, loss of consciousness and headaches.  Endo/Heme/Allergies: Negative for environmental allergies.  Psychiatric/Behavioral: Negative for depression. The patient is not nervous/anxious.        Objective:    Physical Exam  Constitutional: She is oriented to person, place, and time. She appears well-developed and well-nourished. No distress.  HENT:  Head: Normocephalic and atraumatic.  Nose: Nose normal.  Eyes: Right eye exhibits no discharge. Left eye exhibits no discharge.  Neck: Normal range of motion. Neck supple.  Cardiovascular: Normal rate and regular rhythm.   No murmur heard. Pulmonary/Chest: Effort normal and breath sounds normal.  Abdominal: Soft. Bowel sounds are normal. There is no tenderness.  Musculoskeletal: She exhibits no edema.  Neurological: She is alert and oriented to person, place, and time.  Skin: Skin is warm and dry.  Psychiatric: She has a normal mood and affect.  Nursing note and vitals reviewed.   BP 110/80 (BP Location: Left Arm, Patient Position: Sitting, Cuff Size: Normal)   Pulse 69   Temp 98.1 F (36.7 C) (Oral)   Ht 5' (1.524 m)   Wt 120 lb (54.4 kg)   SpO2 97%   BMI 23.44 kg/m  Wt Readings from Last 3 Encounters:  11/23/15 120 lb (54.4 kg)  11/03/15 120 lb 6.4 oz (54.6 kg)  09/28/15 119 lb (54 kg)     Lab Results  Component Value Date   WBC 4.7 09/28/2015   HGB 13.1 09/28/2015   HCT 38.6 09/28/2015   PLT 198 09/28/2015   GLUCOSE 110 09/28/2015   CHOL 184 09/04/2014   TRIG 75.0 09/04/2014   HDL 66.50 09/04/2014   LDLCALC 102 (H) 09/04/2014   ALT 11 09/28/2015   AST 21 09/28/2015   NA 143 09/28/2015   K 3.4 (L) 09/28/2015   CL 102 09/10/2015   CREATININE 0.7 09/28/2015    BUN 7.5 09/28/2015   CO2 24 09/28/2015   TSH 2.72 09/04/2014    Lab Results  Component Value Date   TSH 2.72 09/04/2014   Lab Results  Component Value Date   WBC 4.7 09/28/2015   HGB 13.1 09/28/2015   HCT 38.6 09/28/2015   MCV 95 09/28/2015   PLT 198 09/28/2015   Lab Results  Component Value Date   NA 143 09/28/2015   K 3.4 (L) 09/28/2015   CHLORIDE 108 09/28/2015   CO2 24 09/28/2015   GLUCOSE 110 09/28/2015   BUN 7.5 09/28/2015  CREATININE 0.7 09/28/2015   BILITOT 0.55 09/28/2015   ALKPHOS 54 09/28/2015   AST 21 09/28/2015   ALT 11 09/28/2015   PROT 8.0 09/28/2015   ALBUMIN 3.6 09/28/2015   CALCIUM 8.9 09/28/2015   ANIONGAP 11 09/28/2015   EGFR >90 09/28/2015   GFR 95.43 09/04/2014   Lab Results  Component Value Date   CHOL 184 09/04/2014   Lab Results  Component Value Date   HDL 66.50 09/04/2014   Lab Results  Component Value Date   LDLCALC 102 (H) 09/04/2014   Lab Results  Component Value Date   TRIG 75.0 09/04/2014   Lab Results  Component Value Date   CHOLHDL 3 09/04/2014   No results found for: HGBA1C     Assessment & Plan:   Problem List Items Addressed This Visit    Vitamin D deficiency    Tolerating Vitamin D 50000 iu weekly and 400 daily recheck in 2 months      Relevant Orders   Vitamin D (25 hydroxy)   Hyperlipidemia    Encouraged heart healthy diet, increase exercise, avoid trans fats, consider a krill oil cap daily      Relevant Medications   diltiazem (CARDIZEM) 30 MG tablet   aspirin EC 81 MG tablet   Other Relevant Orders   Lipid panel   Constipation    Encouraged increased hydration and fiber in diet. Daily probiotics. If bowels not moving can use MOM 2 tbls po in 4 oz of warm prune juice by mouth every 2-3 days. If no results then repeat in 4 hours with  Dulcolax suppository pr, may repeat again in 4 more hours as needed. Seek care if symptoms worsen. Consider daily Miralax and/or Dulcolax if symptoms persist.        HTN (hypertension), benign    Well controlled, no changes to meds. Encouraged heart healthy diet such as the DASH diet and exercise as tolerated. Following with cardiology      Relevant Medications   diltiazem (CARDIZEM) 30 MG tablet   aspirin EC 81 MG tablet    Other Visit Diagnoses    Hypertension, unspecified type    -  Primary   Relevant Medications   diltiazem (CARDIZEM) 30 MG tablet   aspirin EC 81 MG tablet   Other Relevant Orders   TSH   CBC   Comprehensive metabolic panel      I am having Ms. Prochazka maintain her multivitamin, vitamin C, calcium carbonate, cetirizine, b complex vitamins, diphenhydrAMINE, LORazepam, methocarbamol, HYDROcodone-acetaminophen, ondansetron, HYDROcodone-homatropine, levalbuterol, ergocalciferol, diltiazem, and aspirin EC.  Meds ordered this encounter  Medications  . diltiazem (CARDIZEM) 30 MG tablet    Sig: Take 1 tablet (30 mg total) by mouth 3 (three) times daily.  Marland Kitchen aspirin EC 81 MG tablet    Sig: Take 1 tablet (81 mg total) by mouth daily.     Penni Homans, MD

## 2015-11-23 NOTE — Assessment & Plan Note (Signed)
Tolerating Vitamin D 50000 iu weekly and 400 daily recheck in 2 months

## 2015-11-23 NOTE — Assessment & Plan Note (Signed)
Encouraged heart healthy diet, increase exercise, avoid trans fats, consider a krill oil cap daily 

## 2015-11-23 NOTE — Assessment & Plan Note (Signed)
Well controlled, no changes to meds. Encouraged heart healthy diet such as the DASH diet and exercise as tolerated. Following with cardiology

## 2015-11-23 NOTE — Assessment & Plan Note (Signed)
Encouraged increased hydration and fiber in diet. Daily probiotics. If bowels not moving can use MOM 2 tbls po in 4 oz of warm prune juice by mouth every 2-3 days. If no results then repeat in 4 hours with  Dulcolax suppository pr, may repeat again in 4 more hours as needed. Seek care if symptoms worsen. Consider daily Miralax and/or Dulcolax if symptoms persist.  

## 2015-11-23 NOTE — Progress Notes (Signed)
Pre visit review using our clinic review tool, if applicable. No additional management support is needed unless otherwise documented below in the visit note. 

## 2015-11-27 DIAGNOSIS — R0602 Shortness of breath: Secondary | ICD-10-CM | POA: Diagnosis not present

## 2015-11-27 DIAGNOSIS — R079 Chest pain, unspecified: Secondary | ICD-10-CM | POA: Diagnosis not present

## 2015-11-27 DIAGNOSIS — I10 Essential (primary) hypertension: Secondary | ICD-10-CM | POA: Diagnosis not present

## 2015-11-27 DIAGNOSIS — R002 Palpitations: Secondary | ICD-10-CM | POA: Diagnosis not present

## 2015-12-22 ENCOUNTER — Telehealth: Payer: Self-pay | Admitting: Family Medicine

## 2015-12-22 NOTE — Telephone Encounter (Signed)
Called the patient per PCP instructions.  PCP received [facility] needing [facility], but had not been successful getting the patient. I did speak to the patient today 12/22/2015.  She states that 10 years ago she had a chemo cup put in her right breast to delivery chemo.  When the cup was removed it has left a knot and stitches that still open at times. She stated the mammo was terrible painful and she refuses to have another mammogram this year.  She states she knows what they are seeing and it is not a problem, but the knot where the cup was.  She would only agree to an Ultrasound. She stated her breast hurt for one week after this years mammogram which had never happened before. She has been aware of [facility] trying to contact her and she knew why, but planned not to have a further mammogram.  She would like to however hear back from PCP on this matter, thanks,

## 2015-12-22 NOTE — Telephone Encounter (Signed)
Faxed over form to [facility] stating patient declined MM at this time, would consider [place] due to his as noted and pain

## 2016-01-01 ENCOUNTER — Other Ambulatory Visit (INDEPENDENT_AMBULATORY_CARE_PROVIDER_SITE_OTHER): Payer: BLUE CROSS/BLUE SHIELD

## 2016-01-01 DIAGNOSIS — E559 Vitamin D deficiency, unspecified: Secondary | ICD-10-CM | POA: Diagnosis not present

## 2016-01-01 LAB — COMPREHENSIVE METABOLIC PANEL
ALK PHOS: 46 U/L (ref 39–117)
ALT: 13 U/L (ref 0–35)
AST: 22 U/L (ref 0–37)
Albumin: 4 g/dL (ref 3.5–5.2)
BUN: 9 mg/dL (ref 6–23)
CHLORIDE: 103 meq/L (ref 96–112)
CO2: 29 meq/L (ref 19–32)
Calcium: 8.8 mg/dL (ref 8.4–10.5)
Creatinine, Ser: 0.61 mg/dL (ref 0.40–1.20)
GFR: 111.23 mL/min (ref >60.00)
GLUCOSE: 82 mg/dL (ref 70–99)
POTASSIUM: 3.7 meq/L (ref 3.5–5.1)
SODIUM: 139 meq/L (ref 135–145)
TOTAL PROTEIN: 7.5 g/dL (ref 6.0–8.3)
Total Bilirubin: 0.4 mg/dL (ref 0.2–1.2)

## 2016-01-01 LAB — LIPID PANEL
Cholesterol: 189 mg/dL (ref 0–200)
HDL: 64 mg/dL (ref >39.00)
LDL Cholesterol: 112 mg/dL — ABNORMAL HIGH (ref 0–99)
NonHDL: 125.38
Total CHOL/HDL Ratio: 3
Triglycerides: 69 mg/dL (ref 0.0–149.0)
VLDL: 13.8 mg/dL (ref 0.0–40.0)

## 2016-01-01 LAB — CBC
HEMATOCRIT: 37.6 % (ref 36.0–46.0)
HEMOGLOBIN: 12.6 g/dL (ref 12.0–15.0)
MCHC: 33.5 g/dL (ref 30.0–36.0)
MCV: 92 fl (ref 78.0–100.0)
Platelets: 211 10*3/uL (ref 150.0–400.0)
RBC: 4.09 Mil/uL (ref 3.87–5.11)
RDW: 13 % (ref 11.5–15.5)
WBC: 3.7 10*3/uL — ABNORMAL LOW (ref 4.0–10.5)

## 2016-01-01 LAB — VITAMIN D 25 HYDROXY (VIT D DEFICIENCY, FRACTURES): VITD: 16.13 ng/mL — ABNORMAL LOW (ref 30.00–100.00)

## 2016-01-01 LAB — TSH: TSH: 4.05 u[IU]/mL (ref 0.35–4.50)

## 2016-01-04 MED ORDER — ERGOCALCIFEROL 1.25 MG (50000 UT) PO CAPS: 50000.0000 [IU] | ORAL_CAPSULE | ORAL | 4 refills | Status: DC

## 2016-01-04 NOTE — Addendum Note (Signed)
Addended by: Magdalene Molly A on: 01/04/2016 04:50 PM   Modules accepted: Orders

## 2016-01-08 ENCOUNTER — Encounter: Payer: Self-pay | Admitting: Family Medicine

## 2016-01-08 ENCOUNTER — Ambulatory Visit (INDEPENDENT_AMBULATORY_CARE_PROVIDER_SITE_OTHER): Payer: BLUE CROSS/BLUE SHIELD | Admitting: Family Medicine

## 2016-01-08 VITALS — BP 90/62 | HR 57 | Temp 98.2°F | Ht 60.0 in | Wt 123.1 lb

## 2016-01-08 DIAGNOSIS — R252 Cramp and spasm: Secondary | ICD-10-CM

## 2016-01-08 DIAGNOSIS — I10 Essential (primary) hypertension: Secondary | ICD-10-CM | POA: Diagnosis not present

## 2016-01-08 DIAGNOSIS — M62838 Other muscle spasm: Secondary | ICD-10-CM

## 2016-01-08 DIAGNOSIS — M549 Dorsalgia, unspecified: Secondary | ICD-10-CM

## 2016-01-08 HISTORY — DX: Dorsalgia, unspecified: M54.9

## 2016-01-08 LAB — COMPREHENSIVE METABOLIC PANEL
ALK PHOS: 49 U/L (ref 39–117)
ALT: 13 U/L (ref 0–35)
AST: 21 U/L (ref 0–37)
Albumin: 4.2 g/dL (ref 3.5–5.2)
BILIRUBIN TOTAL: 0.7 mg/dL (ref 0.2–1.2)
BUN: 10 mg/dL (ref 6–23)
CO2: 33 meq/L — AB (ref 19–32)
CREATININE: 0.65 mg/dL (ref 0.40–1.20)
Calcium: 9 mg/dL (ref 8.4–10.5)
Chloride: 102 mEq/L (ref 96–112)
GFR: 103.36 mL/min (ref >60.00)
GLUCOSE: 88 mg/dL (ref 70–99)
Potassium: 4 mEq/L (ref 3.5–5.1)
Sodium: 139 mEq/L (ref 135–145)
TOTAL PROTEIN: 7.9 g/dL (ref 6.0–8.3)

## 2016-01-08 LAB — MAGNESIUM: Magnesium: 2.2 mg/dL (ref 1.5–2.5)

## 2016-01-08 NOTE — Patient Instructions (Signed)
Can try Aspercreme for sore muscles  Muscle Cramps and Spasms Muscle cramps and spasms are when muscles tighten by themselves. They usually get better within minutes. Muscle cramps are painful. They are usually stronger and last longer than muscle spasms. Muscle spasms may or may not be painful. They can last a few seconds or much longer. HOME CARE  Drink enough fluid to keep your pee (urine) clear or pale yellow.  Massage, stretch, and relax the muscle.  Use a warm towel, heating pad, or warm shower water on tight muscles.  Place ice on the muscle if it is tender or in pain.  Put ice in a plastic bag.  Place a towel between your skin and the bag.  Leave the ice on for 15-20 minutes, 3-4 times a day.  Only take medicine as told by your doctor. GET HELP RIGHT AWAY IF:  Your cramps or spasms get worse, happen more often, or do not get better with time. MAKE SURE YOU:  Understand these instructions.  Will watch your condition.  Will get help right away if you are not doing well or get worse. This information is not intended to replace advice given to you by your health care provider. Make sure you discuss any questions you have with your health care provider. Document Released: 12/31/2007 Document Revised: 05/14/2012 Document Reviewed: 10/21/2014 Elsevier Interactive Patient Education  2017 [employer].

## 2016-01-08 NOTE — Assessment & Plan Note (Signed)
Worse since starting HCTZ. Check cmp and magnesium and given handout on hi potassium foods

## 2016-01-08 NOTE — Assessment & Plan Note (Signed)
Well controlled, no changes to meds. Encouraged heart healthy diet such as the DASH diet and exercise as tolerated.  °

## 2016-01-08 NOTE — Assessment & Plan Note (Signed)
Encouraged moist heat and gentle stretching as tolerated. May try NSAIDs and prescription meds as directed and report if symptoms worsen or seek immediate care, Robaxin prn

## 2016-01-08 NOTE — Progress Notes (Signed)
Pre visit review using our clinic review tool, if applicable. No additional management support is needed unless otherwise documented below in the visit note. 

## 2016-01-08 NOTE — Assessment & Plan Note (Signed)
Patient very anxious about her breathing encourged to deep breat before taking excessive xopenex doses.

## 2016-01-08 NOTE — Progress Notes (Signed)
Patient ID: Elisheba Mceachern, female   DOB: 1965-05-13, 50 y.o.   MRN: [id number]   Subjective:    Patient ID: Caryl Larin, female    DOB: [birth year], 50 y.o.   MRN: [id number]  Chief Complaint  Patient presents with  . Follow-up    HPI Patient is in today for follow up. She is doing better. No recent chest pain or new concerns. Continues to use Xopenex prn and denies any cconcerning side effects like she suffered with albuterol use. She is following with cardiology at this time. Denies CP/palp/HA/congestion/fevers/GI or GU c/o. Taking meds as prescribed  Past Medical History:  Diagnosis Date  . Acute bronchitis 09/09/2015  . Allergy   . Asthma   . Asthma 09/09/2015  . Breast cancer, left breast ([place])    2007   . Cancer ([place])    2007  . Cervical cancer screening 01/01/2015  . Constipation 11/23/2015  . Dermatitis 01/04/2015  . Estrogen deficient vulvovaginitis 09/14/2014  . HIV infection ([place])   . HTN (hypertension), benign 11/23/2015  . Hyperlipidemia   . Positive PPD   . Pruritus 09/04/2014  . Thyroid disease   . Vitamin D deficiency     Past Surgical History:  Procedure Laterality Date  . MASTECTOMY     left breast    Family History  Problem Relation Age of Onset  . Cancer Mother     cancer  . Hypertension Father   . Stroke Father   . Heart disease Father   . Diabetes Father     Social History   Social History  . Marital status: Single    Spouse name: N/A  . Number of children: N/A  . Years of education: N/A   Occupational History  . patient care coordinator    Social History Main Topics  . Smoking status: Never Smoker  . Smokeless tobacco: Never Used  . Alcohol use 0.0 oz/week  . Drug use: No  . Sexual activity: No   Other Topics Concern  . Not on file   Social History Narrative  . No narrative on file    Outpatient Medications Prior to Visit  Medication Sig Dispense Refill  . Ascorbic Acid (VITAMIN C) 1000 MG tablet Take 1,000 mg by mouth daily.    Marland Kitchen  aspirin EC 81 MG tablet Take 1 tablet (81 mg total) by mouth daily.    Marland Kitchen b complex vitamins tablet Take 1 tablet by mouth daily.    . calcium carbonate (OS-CAL) 600 MG TABS tablet Take 1,800 mg by mouth 2 (two) times daily with a meal.    . cetirizine (ZYRTEC) 10 MG tablet Take 10 mg by mouth daily.    Marland Kitchen diltiazem (CARDIZEM) 30 MG tablet Take 1 tablet (30 mg total) by mouth 3 (three) times daily.    . diphenhydrAMINE (BENADRYL) 25 MG tablet Take 25 mg by mouth every 6 (six) hours as needed for itching.     . ergocalciferol (VITAMIN D2) 50000 units capsule Take 1 capsule (50,000 Units total) by mouth once a week. 4 capsule 4  . HYDROcodone-acetaminophen (NORCO) 5-325 MG tablet Take 1 tablet by mouth every 6 (six) hours as needed for moderate pain. 8 tablet 0  . levalbuterol (XOPENEX HFA) 45 MCG/ACT inhaler Inhale 1 puff into the lungs every 6 (six) hours as needed for wheezing or shortness of breath. Fill as daw. They do have permission to fill either but go ahead and rewrite. 1 Inhaler 5  . LORazepam (ATIVAN) 0.5  MG tablet Take 1 tablet (0.5 mg total) by mouth 2 (two) times daily as needed for anxiety. 10 tablet 1  . methocarbamol (ROBAXIN) 500 MG tablet Take 1 tablet (500 mg total) by mouth every 8 (eight) hours as needed for muscle spasms. 30 tablet 1  . Multiple Vitamin (MULTIVITAMIN) tablet Take 1 tablet by mouth daily.    . ondansetron (ZOFRAN) 4 MG tablet Take 1 tablet (4 mg total) by mouth every 8 (eight) hours as needed for nausea or vomiting. 30 tablet 1  . HYDROcodone-homatropine (HYCODAN) 5-1.5 MG/5ML syrup Take 5 mLs by mouth every 8 (eight) hours as needed for cough. 120 mL 0   No facility-administered medications prior to visit.     Allergies  Allergen Reactions  . Sulfa Antibiotics Anaphylaxis  . Penicillins Rash    Review of Systems  Constitutional: Negative for fever and malaise/fatigue.  HENT: Negative for congestion.   Eyes: Negative for blurred vision.  Respiratory:  Negative for shortness of breath.   Cardiovascular: Negative for chest pain, palpitations and leg swelling.  Gastrointestinal: Negative for abdominal pain, blood in stool and nausea.  Genitourinary: Negative for dysuria and frequency.  Musculoskeletal: Positive for back pain and myalgias. Negative for falls.  Skin: Negative for rash.  Neurological: Negative for dizziness, loss of consciousness and headaches.  Endo/Heme/Allergies: Negative for environmental allergies.  Psychiatric/Behavioral: Negative for depression. The patient is nervous/anxious.        Objective:    Physical Exam  Constitutional: She is oriented to person, place, and time. She appears well-developed and well-nourished. No distress.  HENT:  Head: Normocephalic and atraumatic.  Nose: Nose normal.  Eyes: Right eye exhibits no discharge. Left eye exhibits no discharge.  Neck: Normal range of motion. Neck supple.  Cardiovascular: Normal rate and regular rhythm.   No murmur heard. Pulmonary/Chest: Effort normal and breath sounds normal.  Abdominal: Soft. Bowel sounds are normal. There is no tenderness.  Musculoskeletal: She exhibits no edema.  Sore, muscle spasm right paravertebral muscles upper lumbar region  Neurological: She is alert and oriented to person, place, and time.  Skin: Skin is warm and dry.  Psychiatric: She has a normal mood and affect.  Nursing note and vitals reviewed.   BP 90/62 (BP Location: Left Arm, Patient Position: Sitting, Cuff Size: Normal)   Pulse (!) 57   Temp 98.2 F (36.8 C) (Oral)   Ht 5' (1.524 m)   Wt 123 lb 2 oz (55.8 kg)   SpO2 95%   BMI 24.05 kg/m  Wt Readings from Last 3 Encounters:  01/08/16 123 lb 2 oz (55.8 kg)  11/23/15 120 lb (54.4 kg)  11/03/15 120 lb 6.4 oz (54.6 kg)     Lab Results  Component Value Date   WBC 3.7 (L) 01/01/2016   HGB 12.6 01/01/2016   HCT 37.6 01/01/2016   PLT 211.0 01/01/2016   GLUCOSE 82 01/01/2016   CHOL 189 01/01/2016   TRIG 69.0  01/01/2016   HDL 64.00 01/01/2016   LDLCALC 112 (H) 01/01/2016   ALT 13 01/01/2016   AST 22 01/01/2016   NA 139 01/01/2016   K 3.7 01/01/2016   CL 103 01/01/2016   CREATININE 0.61 01/01/2016   BUN 9 01/01/2016   CO2 29 01/01/2016   TSH 4.05 01/01/2016    Lab Results  Component Value Date   TSH 4.05 01/01/2016   Lab Results  Component Value Date   WBC 3.7 (L) 01/01/2016   HGB 12.6 01/01/2016  HCT 37.6 01/01/2016   MCV 92.0 01/01/2016   PLT 211.0 01/01/2016   Lab Results  Component Value Date   NA 139 01/01/2016   K 3.7 01/01/2016   CHLORIDE 108 09/28/2015   CO2 29 01/01/2016   GLUCOSE 82 01/01/2016   BUN 9 01/01/2016   CREATININE 0.61 01/01/2016   BILITOT 0.4 01/01/2016   ALKPHOS 46 01/01/2016   AST 22 01/01/2016   ALT 13 01/01/2016   PROT 7.5 01/01/2016   ALBUMIN 4.0 01/01/2016   CALCIUM 8.8 01/01/2016   ANIONGAP 11 09/28/2015   EGFR >90 09/28/2015   GFR 111.23 01/01/2016   Lab Results  Component Value Date   CHOL 189 01/01/2016   Lab Results  Component Value Date   HDL 64.00 01/01/2016   Lab Results  Component Value Date   LDLCALC 112 (H) 01/01/2016   Lab Results  Component Value Date   TRIG 69.0 01/01/2016   Lab Results  Component Value Date   CHOLHDL 3 01/01/2016   No results found for: HGBA1C     Assessment & Plan:   Problem List Items Addressed This Visit    Muscle spasm    Worse since starting HCTZ. Check cmp and magnesium and given handout on hi potassium foods      HTN (hypertension), benign    Well controlled, no changes to meds. Encouraged heart healthy diet such as the DASH diet and exercise as tolerated.       Relevant Medications   hydrochlorothiazide (HYDRODIURIL) 25 MG tablet    Other Visit Diagnoses    Muscle cramps    -  Primary   Relevant Orders   Comprehensive metabolic panel   Magnesium      I have discontinued Ms. Hargens's HYDROcodone-homatropine. I am also having her maintain her multivitamin, vitamin C,  calcium carbonate, cetirizine, b complex vitamins, diphenhydrAMINE, LORazepam, methocarbamol, HYDROcodone-acetaminophen, ondansetron, levalbuterol, diltiazem, aspirin EC, ergocalciferol, and hydrochlorothiazide.  Meds ordered this encounter  Medications  . hydrochlorothiazide (HYDRODIURIL) 25 MG tablet    Sig: 1/2 tablet by mouth every day     Penni Homans, MD

## 2016-01-14 ENCOUNTER — Telehealth: Payer: Self-pay

## 2016-01-14 NOTE — Telephone Encounter (Signed)
Pt called to confirm appts for tomorrow. Done.

## 2016-01-15 ENCOUNTER — Ambulatory Visit (HOSPITAL_BASED_OUTPATIENT_CLINIC_OR_DEPARTMENT_OTHER): Payer: BLUE CROSS/BLUE SHIELD | Admitting: Hematology & Oncology

## 2016-01-15 ENCOUNTER — Other Ambulatory Visit (HOSPITAL_BASED_OUTPATIENT_CLINIC_OR_DEPARTMENT_OTHER): Payer: BLUE CROSS/BLUE SHIELD

## 2016-01-15 VITALS — BP 110/52 | HR 64 | Temp 98.2°F | Resp 16 | Wt 122.8 lb

## 2016-01-15 DIAGNOSIS — Z853 Personal history of malignant neoplasm of breast: Secondary | ICD-10-CM | POA: Diagnosis not present

## 2016-01-15 DIAGNOSIS — C50012 Malignant neoplasm of nipple and areola, left female breast: Secondary | ICD-10-CM

## 2016-01-15 DIAGNOSIS — R609 Edema, unspecified: Secondary | ICD-10-CM

## 2016-01-15 DIAGNOSIS — C50212 Malignant neoplasm of upper-inner quadrant of left female breast: Secondary | ICD-10-CM

## 2016-01-15 DIAGNOSIS — Z171 Estrogen receptor negative status [ER-]: Principal | ICD-10-CM

## 2016-01-15 LAB — COMPREHENSIVE METABOLIC PANEL
ALT: 11 U/L (ref 0–55)
ANION GAP: 7 meq/L (ref 3–11)
AST: 19 U/L (ref 5–34)
Albumin: 3.6 g/dL (ref 3.5–5.0)
Alkaline Phosphatase: 62 U/L (ref 40–150)
BUN: 18.1 mg/dL (ref 7.0–26.0)
CALCIUM: 8.6 mg/dL (ref 8.4–10.4)
CHLORIDE: 103 meq/L (ref 98–109)
CO2: 27 mEq/L (ref 22–29)
Creatinine: 0.8 mg/dL (ref 0.6–1.1)
EGFR: 90 mL/min/{1.73_m2} — ABNORMAL LOW (ref >90)
Glucose: 82 mg/dl (ref 70–140)
POTASSIUM: 4.2 meq/L (ref 3.5–5.1)
Sodium: 137 mEq/L (ref 136–145)
Total Bilirubin: 0.36 mg/dL (ref 0.20–1.20)
Total Protein: 7.8 g/dL (ref 6.4–8.3)

## 2016-01-15 LAB — CBC WITH DIFFERENTIAL (CANCER CENTER ONLY)
BASO#: 0 10*3/uL (ref 0.0–0.2)
BASO%: 0.3 % (ref 0.0–2.0)
EOS%: 5.2 % (ref 0.0–7.0)
Eosinophils Absolute: 0.3 10*3/uL (ref 0.0–0.5)
HEMATOCRIT: 38.7 % (ref 34.8–46.6)
HGB: 13 g/dL (ref 11.6–15.9)
LYMPH#: 1.5 10*3/uL (ref 0.9–3.3)
LYMPH%: 25.2 % (ref 14.0–48.0)
MCH: 31.5 pg (ref 26.0–34.0)
MCHC: 33.6 g/dL (ref 32.0–36.0)
MCV: 94 fL (ref 81–101)
MONO#: 0.8 10*3/uL (ref 0.1–0.9)
MONO%: 12.8 % (ref 0.0–13.0)
NEUT#: 3.3 10*3/uL (ref 1.5–6.5)
NEUT%: 56.5 % (ref 39.6–80.0)
Platelets: 197 10*3/uL (ref 145–400)
RBC: 4.13 10*6/uL (ref 3.70–5.32)
RDW: 12.4 % (ref 11.1–15.7)
WBC: 5.9 10*3/uL (ref 3.9–10.0)

## 2016-01-15 NOTE — Progress Notes (Signed)
Hematology and Oncology Follow Up Visit  Gail Levine [id number] 1965-11-21 50 y.o. 01/15/2016   Principle Diagnosis:  Stage IIIC (T2N3) grade 3 IDC of the left breast - Triple negative, BRCA negative S/p left mastectomy August, 2007  Current Therapy:   Observation    Interim History:  Gail Levine is here today for follow-up. She's doing well. We last saw her back in August. Everything has been going okay. She is working without difficulty.  She had a mammogram done back in August. She says they wanted her to come back to have a another test. She did not want to come back as she got hurt during the procedure.  She's had no problems with fatigue or weakness. She is on quite a few medications. She's had no change in bowel or bladder habits. She's had no nausea or vomiting. She's had no leg swelling. She's had no fever.  There has been some slight swelling of the left arm. She does not wear a compression sleeve.  Overall, her performance status is ECOG 1.     Medications:  Allergies as of 01/15/2016      Reactions   Sulfa Antibiotics Anaphylaxis   Penicillins Rash      Medication List       Accurate as of 01/15/16  8:52 AM. Always use your most recent med list.          aspirin EC 81 MG tablet Take 1 tablet (81 mg total) by mouth daily.   b complex vitamins tablet Take 1 tablet by mouth daily.   calcium carbonate 600 MG Tabs tablet Commonly known as:  OS-CAL Take 1,800 mg by mouth 2 (two) times daily with a meal.   CARDIZEM 30 MG tablet Generic drug:  diltiazem Take 1 tablet (30 mg total) by mouth 3 (three) times daily.   cetirizine 10 MG tablet Commonly known as:  ZYRTEC Take 10 mg by mouth daily.   diphenhydrAMINE 25 MG tablet Commonly known as:  BENADRYL Take 25 mg by mouth every 6 (six) hours as needed for itching.   ergocalciferol 50000 units capsule Commonly known as:  VITAMIN D2 Take 1 capsule (50,000 Units total) by mouth once a week.     hydrochlorothiazide 25 MG tablet Commonly known as:  HYDRODIURIL 1/2 tablet by mouth every day   HYDROcodone-acetaminophen 5-325 MG tablet Commonly known as:  NORCO Take 1 tablet by mouth every 6 (six) hours as needed for moderate pain.   levalbuterol 45 MCG/ACT inhaler Commonly known as:  XOPENEX HFA Inhale 1 puff into the lungs every 6 (six) hours as needed for wheezing or shortness of breath. Fill as daw. They do have permission to fill either but go ahead and rewrite.   LORazepam 0.5 MG tablet Commonly known as:  ATIVAN Take 1 tablet (0.5 mg total) by mouth 2 (two) times daily as needed for anxiety.   methocarbamol 500 MG tablet Commonly known as:  ROBAXIN Take 1 tablet (500 mg total) by mouth every 8 (eight) hours as needed for muscle spasms.   multivitamin tablet Take 1 tablet by mouth daily.   ondansetron 4 MG tablet Commonly known as:  ZOFRAN Take 1 tablet (4 mg total) by mouth every 8 (eight) hours as needed for nausea or vomiting.   vitamin C 1000 MG tablet Take 1,000 mg by mouth daily.       Allergies:  Allergies  Allergen Reactions  . Sulfa Antibiotics Anaphylaxis  . Penicillins Rash    Past Medical  History, Surgical history, Social history, and Family History were reviewed and updated.  Review of Systems: All other 10 point review of systems is negative.   Physical Exam:  weight is 122 lb 12.8 oz (55.7 kg). Her oral temperature is 98.2 F (36.8 C). Her blood pressure is 110/52 (abnormal) and her pulse is 64. Her respiration is 16 and oxygen saturation is 99%.   Wt Readings from Last 3 Encounters:  01/15/16 122 lb 12.8 oz (55.7 kg)  01/08/16 123 lb 2 oz (55.8 kg)  11/23/15 120 lb (54.4 kg)    Ocular: Sclerae unicteric, pupils equal, round and reactive to light Ear-nose-throat: Oropharynx clear, dentition fair Lymphatic: No cervical supraclavicular or axillary adenopathy Lungs no rales or rhonchi, good excursion bilaterally Heart regular rate  and rhythm, no murmur appreciated Abd soft, nontender, positive bowel sounds MSK no focal spinal tenderness, no joint edema Neuro: non-focal, well-oriented, appropriate affect Breasts: Left breast mastectomy. Well-healed mastectomy scar. There is some tenderness in the lateral aspect of the mastectomy scar. There is no adenopathy in the left axilla. No changes with the right breast. No mass, lesion, rash or lymphadenopathy found on exam.   Lab Results  Component Value Date   WBC 5.9 01/15/2016   HGB 13.0 01/15/2016   HCT 38.7 01/15/2016   MCV 94 01/15/2016   PLT 197 01/15/2016   No results found for: FERRITIN, IRON, TIBC, UIBC, IRONPCTSAT Lab Results  Component Value Date   RBC 4.13 01/15/2016   No results found for: KPAFRELGTCHN, LAMBDASER, KAPLAMBRATIO No results found for: IGGSERUM, IGA, IGMSERUM No results found for: Gail Levine, SPEI   Chemistry      Component Value Date/Time   NA 139 01/08/2016 0835   NA 143 09/28/2015 1350   K 4.0 01/08/2016 0835   K 3.4 (L) 09/28/2015 1350   CL 102 01/08/2016 0835   CL 101 05/28/2015 1435   CO2 33 (H) 01/08/2016 0835   CO2 24 09/28/2015 1350   BUN 10 01/08/2016 0835   BUN 7.5 09/28/2015 1350   CREATININE 0.65 01/08/2016 0835   CREATININE 0.7 09/28/2015 1350      Component Value Date/Time   CALCIUM 9.0 01/08/2016 0835   CALCIUM 8.9 09/28/2015 1350   ALKPHOS 49 01/08/2016 0835   ALKPHOS 54 09/28/2015 1350   AST 21 01/08/2016 0835   AST 21 09/28/2015 1350   ALT 13 01/08/2016 0835   ALT 11 09/28/2015 1350   BILITOT 0.7 01/08/2016 0835   BILITOT 0.55 09/28/2015 1350     Impression and Plan: Gail Levine is a very pleasant 50 yo Asian female with history of stage IIIC (T2N3) grade 3 IDC of the left breast diagnosed in 2007. She was triple negative and also BRCA negative. She had a left mastectomy in August 2007 followed by adjuvant chemotherapy with Adriamycin, Cytoxan and Taxol  (September 2007 - January 2008). She also received radiation to the chest ( February 2008 - March 2008). So far, she has done well and there has been no evidence of recurrence.   I do not Gail Levine any evidence of disease recurrence. Given that she has trouble negative disease, it is now been 10 years. I just do not think that this will come back.  We will get her back in 6 more months. She likes to follow-up with [place]. She just feels more confident that she is being seen and examined. Volanda Napoleon, MD 12/15/20178:52 AM

## 2016-01-21 ENCOUNTER — Telehealth: Payer: Self-pay | Admitting: Pulmonary Disease

## 2016-01-21 NOTE — Telephone Encounter (Signed)
RA  Please Advise  Spoke with this pt. She had an appointment originally to [place] you in the 29 of dec. But you are not working that day so she canceled the appointment because she only wants to [place] you. The only openings you had available is for the 1/31 but the pt would like to be sooner than that. She wanted to [place] if you were willing to [place] her sooner.

## 2016-01-21 NOTE — Telephone Encounter (Signed)
Spoke with pt. She has been scheduled to [facility] on 02/09/16 at 9:30am. Nothing further was needed.

## 2016-01-21 NOTE — Telephone Encounter (Signed)
1/9 at 930 am

## 2016-01-22 ENCOUNTER — Other Ambulatory Visit: Payer: BLUE CROSS/BLUE SHIELD

## 2016-01-22 ENCOUNTER — Ambulatory Visit: Payer: BLUE CROSS/BLUE SHIELD | Admitting: Hematology & Oncology

## 2016-01-29 ENCOUNTER — Ambulatory Visit: Payer: BLUE CROSS/BLUE SHIELD | Admitting: Pulmonary Disease

## 2016-02-09 ENCOUNTER — Encounter: Payer: Self-pay | Admitting: Pulmonary Disease

## 2016-02-09 ENCOUNTER — Ambulatory Visit (INDEPENDENT_AMBULATORY_CARE_PROVIDER_SITE_OTHER): Payer: BLUE CROSS/BLUE SHIELD | Admitting: Pulmonary Disease

## 2016-02-09 DIAGNOSIS — R002 Palpitations: Secondary | ICD-10-CM

## 2016-02-09 MED ORDER — BECLOMETHASONE DIPROPIONATE 40 MCG/ACT IN AERS: 2.0000 | INHALATION_SPRAY | Freq: Two times a day (BID) | RESPIRATORY_TRACT | 3 refills | Status: DC

## 2016-02-09 NOTE — Assessment & Plan Note (Signed)
   Okay to stay on Xopenex MDI twice daily as needed   Would suggest trial of QVAR 40 twice daily . This is an inhaled steroid and would suppress inflammation due to asthma and would help decrease the dose of Xopenex required Some concern for tachyarrhythmias here I'm not convinced this is true asthma-definitely component of anxiety here

## 2016-02-09 NOTE — Patient Instructions (Signed)
Okay to stay on Xopenex MDI twice daily as needed  Would suggest trial of QVAR 40 twice daily . This is an inhaled steroid and would suppress inflammation due to asthma and would help decrease the dose of Xopenex required

## 2016-02-09 NOTE — Progress Notes (Signed)
   Subjective:    Patient ID: Gail Levine, female    DOB: Jun 27, 1965, 51 y.o.   MRN: [id number]  HPI  51 year old never smoker originally from [place] for FU of asthma She denies a childhood history of asthma. She emigrated from [place] at age 16, lived in [place] for about 25 years before moving to [place] about 4 years ago. She reports a diagnosis of asthma made 4 years ago but she wonders if her symptoms are mainly related to allergies and her family doctor made a history decision to put her on albuterol. She denies wheezing or nocturnal symptoms. She denies year visits or hospitalizations due to asthma. She does report episodes of sudden shortness of breath and chest tightness, with sometimes resolves spontaneously in a few minutes, at other times resolved immediately after taking a puff of albuterol-again she has no wheezing during these episodes.  The last 3 weeks she has been using Xopenex-she may have used this about 6 times in that duration-never more than once per day. She states that during her paroxysms, her sister may have to pat on her back to clear her chest. She also reports that Xopenex drops her blood pressure.  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  She was seen in [facility] ED for chest pain and palpitations, EKG and troponins were negative. She was seen by cardiology at [facility] and underwent an exercise stress test which was negative for ischemia. She underwent a Holter testing and she reports that she was told that her heart rate went as high as 180 and as low as 35 and she was told that she has tachybradycardia syndrome. I have reviewed her [facility] records and did not [place] any mention of this. I do note that beta blocker was not given due to resting bradycardia  She reports a history of left breast cancer for which she received chemotherapy (Adriamycin) and radiation. She is worried about effects on the lung and heart.   02/09/2016  Chief Complaint  Patient presents with   . Follow-up    For SOB. Last seen on 11/03/15.    She has settled down to a regimen of Xopenex about twice daily - she uses this when her chest feels tight and feels that this relaxes her. Has not had further episodes of tachyarrhythmias- but she does take diltiazem daily We discussed allergy testing which was completely negative-which she continues on Zyrtec and Benadryl daily  Significant tests/ events  Chest x-ray 09/2015 shows hyperinflation and is clear of infiltrates CT sinuses 07/2015 does not show any evidence of sinusitis Spirometry  nml RAST 11/2015 neg  Review of Systems Patient denies significant dyspnea,cough, hemoptysis,  chest pain, palpitations, pedal edema, orthopnea, paroxysmal nocturnal dyspnea, lightheadedness, nausea, vomiting, abdominal or  leg pains      Objective:   Physical Exam  Gen. Pleasant, well-nourished, in no distress ENT - no lesions, no post nasal drip Neck: No JVD, no thyromegaly, no carotid bruits Lungs: no use of accessory muscles, no dullness to percussion, clear without rales or rhonchi  Cardiovascular: Rhythm regular, heart sounds  normal, no murmurs or gallops, no peripheral edema Musculoskeletal: No deformities, no cyanosis or clubbing        Assessment & Plan:

## 2016-02-09 NOTE — Assessment & Plan Note (Signed)
Again would like her to decrease her daily use of Xopenex and discussed alternatives and also anxiolytics

## 2016-03-11 ENCOUNTER — Ambulatory Visit (HOSPITAL_BASED_OUTPATIENT_CLINIC_OR_DEPARTMENT_OTHER)
Admission: RE | Admit: 2016-03-11 | Discharge: 2016-03-11 | Disposition: A | Discharge status: Home / Self Care | Payer: BLUE CROSS/BLUE SHIELD | Source: Ambulatory Visit | Attending: Family Medicine | Admitting: Family Medicine

## 2016-03-11 ENCOUNTER — Ambulatory Visit (INDEPENDENT_AMBULATORY_CARE_PROVIDER_SITE_OTHER): Payer: BLUE CROSS/BLUE SHIELD | Admitting: Family Medicine

## 2016-03-11 VITALS — BP 96/58 | HR 59 | Temp 97.3°F | Wt 123.0 lb

## 2016-03-11 DIAGNOSIS — I10 Essential (primary) hypertension: Secondary | ICD-10-CM | POA: Diagnosis not present

## 2016-03-11 DIAGNOSIS — M545 Low back pain: Secondary | ICD-10-CM | POA: Diagnosis not present

## 2016-03-11 DIAGNOSIS — M62838 Other muscle spasm: Secondary | ICD-10-CM | POA: Diagnosis not present

## 2016-03-11 DIAGNOSIS — E559 Vitamin D deficiency, unspecified: Secondary | ICD-10-CM

## 2016-03-11 LAB — VITAMIN D 25 HYDROXY (VIT D DEFICIENCY, FRACTURES): VITD: 11.02 ng/mL — AB (ref 30.00–100.00)

## 2016-03-11 NOTE — Progress Notes (Signed)
Pre visit review using our clinic review tool, if applicable. No additional management support is needed unless otherwise documented below in the visit note. 

## 2016-03-11 NOTE — Patient Instructions (Signed)
Take Vitamin D 3000 to 5000 IU daily Hypertension Hypertension, commonly called high blood pressure, is when the force of blood pumping through your arteries is too strong. Your arteries are the blood vessels that carry blood from your heart throughout your body. A blood pressure reading consists of a higher number over a lower number, such as 110/72. The higher number (systolic) is the pressure inside your arteries when your heart pumps. The lower number (diastolic) is the pressure inside your arteries when your heart relaxes. Ideally you want your blood pressure below 120/80. Hypertension forces your heart to work harder to pump blood. Your arteries may become narrow or stiff. Having untreated or uncontrolled hypertension can cause heart attack, stroke, kidney disease, and other problems. What increases the risk? Some risk factors for high blood pressure are controllable. Others are not. Risk factors you cannot control include:  Race. You may be at higher risk if you are African American.  Age. Risk increases with age.  Gender. Men are at higher risk than women before age 26 years. After age 18, women are at higher risk than men. Risk factors you can control include:  Not getting enough exercise or physical activity.  Being overweight.  Getting too much fat, sugar, calories, or salt in your diet.  Drinking too much alcohol. What are the signs or symptoms? Hypertension does not usually cause signs or symptoms. Extremely high blood pressure (hypertensive crisis) may cause headache, anxiety, shortness of breath, and nosebleed. How is this diagnosed? To check if you have hypertension, your health care provider will measure your blood pressure while you are seated, with your arm held at the level of your heart. It should be measured at least twice using the same arm. Certain conditions can cause a difference in blood pressure between your right and left arms. A blood pressure reading that is  higher than normal on one occasion does not mean that you need treatment. If it is not clear whether you have high blood pressure, you may be asked to return on a different day to have your blood pressure checked again. Or, you may be asked to monitor your blood pressure at home for 1 or more weeks. How is this treated? Treating high blood pressure includes making lifestyle changes and possibly taking medicine. Living a healthy lifestyle can help lower high blood pressure. You may need to change some of your habits. Lifestyle changes may include:  Following the DASH diet. This diet is high in fruits, vegetables, and whole grains. It is low in salt, red meat, and added sugars.  Keep your sodium intake below 2,300 mg per day.  Getting at least 30-45 minutes of aerobic exercise at least 4 times per week.  Losing weight if necessary.  Not smoking.  Limiting alcoholic beverages.  Learning ways to reduce stress. Your health care provider may prescribe medicine if lifestyle changes are not enough to get your blood pressure under control, and if one of the following is true:  You are 14-61 years of age and your systolic blood pressure is above 140.  You are 86 years of age or older, and your systolic blood pressure is above 150.  Your diastolic blood pressure is above 90.  You have diabetes, and your systolic blood pressure is over [id number] or your diastolic blood pressure is over 90.  You have kidney disease and your blood pressure is above 140/90.  You have heart disease and your blood pressure is above 140/90. Your personal target  blood pressure may vary depending on your medical conditions, your age, and other factors. Follow these instructions at home:  Have your blood pressure rechecked as directed by your health care provider.  Take medicines only as directed by your health care provider. Follow the directions carefully. Blood pressure medicines must be taken as prescribed. The medicine  does not work as well when you skip doses. Skipping doses also puts you at risk for problems.  Do not smoke.  Monitor your blood pressure at home as directed by your health care provider. Contact a health care provider if:  You think you are having a reaction to medicines taken.  You have recurrent headaches or feel dizzy.  You have swelling in your ankles.  You have trouble with your vision. Get help right away if:  You develop a severe headache or confusion.  You have unusual weakness, numbness, or feel faint.  You have severe chest or abdominal pain.  You vomit repeatedly.  You have trouble breathing. This information is not intended to replace advice given to you by your health care provider. Make sure you discuss any questions you have with your health care provider. Document Released: 01/17/2005 Document Revised: 06/25/2015 Document Reviewed: 11/09/2012 Elsevier Interactive Patient Education  2017 [employer].

## 2016-03-11 NOTE — Assessment & Plan Note (Signed)
Taking daily supplements and the once weekly dosing. Stop the 50000 IU tab and continue daily at 3000 to 5000 IU dose, recheck level

## 2016-03-11 NOTE — Assessment & Plan Note (Signed)
Encouraged moist heat and gentle stretching as tolerated. May try NSAIDs and prescription meds as directed and report if symptoms worsen or seek immediate care. Uses 1/4 tab methocarbimol prn with good results but since pain is present still after about 3 months will proceed with xray and send to PT

## 2016-03-11 NOTE — Assessment & Plan Note (Signed)
Well controlled, no changes to meds. Encouraged heart healthy diet such as the DASH diet and exercise as tolerated.  °

## 2016-03-11 NOTE — Progress Notes (Signed)
Patient ID: Gail Levine, female   DOB: May 01, 1965, 51 y.o.   MRN: [id number]   Subjective:    Patient ID: Gail Levine, female    DOB: 1965-12-17, 51 y.o.   MRN: [id number]  Chief Complaint  Patient presents with  . Follow-up  . Hypertension  . Hyperlipidemia  . Back Pain   I acted as a Education administrator for Dr. Charlett Blake. [name], RMA  Hypertension  This is a chronic problem. The current episode started more than 1 year ago. The problem is unchanged. The problem is controlled. Pertinent negatives include no chest pain, headaches or shortness of breath.  Hyperlipidemia  This is a recurrent problem. The current episode started more than 1 year ago. The problem is controlled. There are no known factors aggravating her hyperlipidemia. Pertinent negatives include no chest pain or shortness of breath.  Back Pain  This is a recurrent problem. The current episode started more than 1 month ago. The problem occurs intermittently. The pain is present in the lumbar spine. The quality of the pain is described as aching. The pain does not radiate. The pain is at a severity of 7/10. The pain is moderate. The symptoms are aggravated by bending (shifting form side to side. And moving too fast.). Pertinent negatives include no abdominal pain, chest pain, fever, headaches, numbness, pelvic pain or tingling. She has tried nothing for the symptoms. The treatment provided no relief.    Patient is in today for follow up for hypertension, hyperlipidemia and other chronic issues. Patient in today complaining of lower back pain that comes and goes with out warning.  Pin is generally localized to the muscles above the hips and the pain occurs with twisting and other random movements. No c/o incontinence or radiculopathy. Has used Methocarbimol 1/4 tab with good results at times. She feels nauseous when she takes the hi dose vitamin D tabs.  Past Medical History:  Diagnosis Date  . Acute bronchitis 09/09/2015  . Allergy   . Asthma   .  Asthma 09/09/2015  . Back pain 01/08/2016  . Breast cancer, left breast ([place])    2007   . Cancer ([place])    2007  . Cervical cancer screening 01/01/2015  . Constipation 11/23/2015  . Dermatitis 01/04/2015  . Estrogen deficient vulvovaginitis 09/14/2014  . HIV infection ([place])   . HTN (hypertension), benign 11/23/2015  . Hyperlipidemia   . Positive PPD   . Pruritus 09/04/2014  . Thyroid disease   . Vitamin D deficiency     Past Surgical History:  Procedure Laterality Date  . MASTECTOMY     left breast    Family History  Problem Relation Age of Onset  . Cancer Mother     cancer  . Hypertension Father   . Stroke Father   . Heart disease Father   . Diabetes Father     Social History   Social History  . Marital status: Single    Spouse name: N/A  . Number of children: N/A  . Years of education: N/A   Occupational History  . patient care coordinator    Social History Main Topics  . Smoking status: Never Smoker  . Smokeless tobacco: Never Used  . Alcohol use 0.0 oz/week  . Drug use: No  . Sexual activity: No   Other Topics Concern  . Not on file   Social History Narrative  . No narrative on file    Outpatient Medications Prior to Visit  Medication Sig Dispense Refill  .  Ascorbic Acid (VITAMIN C) 1000 MG tablet Take 1,000 mg by mouth daily.    Marland Kitchen aspirin EC 81 MG tablet Take 1 tablet (81 mg total) by mouth daily.    Marland Kitchen b complex vitamins tablet Take 1 tablet by mouth daily.    . beclomethasone (QVAR) 40 MCG/ACT inhaler Inhale 2 puffs into the lungs 2 (two) times daily. 1 Inhaler 3  . calcium carbonate (OS-CAL) 600 MG TABS tablet Take 1,800 mg by mouth 2 (two) times daily with a meal.    . cetirizine (ZYRTEC) 10 MG tablet Take 10 mg by mouth daily.    Marland Kitchen diltiazem (CARDIZEM) 30 MG tablet Take 1 tablet (30 mg total) by mouth 3 (three) times daily.    . diphenhydrAMINE (BENADRYL) 25 MG tablet Take 25 mg by mouth every 6 (six) hours as needed for itching.     .  hydrochlorothiazide (HYDRODIURIL) 25 MG tablet 1/2 tablet by mouth every day    . HYDROcodone-acetaminophen (NORCO) 5-325 MG tablet Take 1 tablet by mouth every 6 (six) hours as needed for moderate pain. 8 tablet 0  . levalbuterol (XOPENEX HFA) 45 MCG/ACT inhaler Inhale 1 puff into the lungs every 6 (six) hours as needed for wheezing or shortness of breath. Fill as daw. They do have permission to fill either but go ahead and rewrite. 1 Inhaler 5  . LORazepam (ATIVAN) 0.5 MG tablet Take 1 tablet (0.5 mg total) by mouth 2 (two) times daily as needed for anxiety. 10 tablet 1  . methocarbamol (ROBAXIN) 500 MG tablet Take 1 tablet (500 mg total) by mouth every 8 (eight) hours as needed for muscle spasms. 30 tablet 1  . Multiple Vitamin (MULTIVITAMIN) tablet Take 1 tablet by mouth daily.    . ondansetron (ZOFRAN) 4 MG tablet Take 1 tablet (4 mg total) by mouth every 8 (eight) hours as needed for nausea or vomiting. 30 tablet 1  . ergocalciferol (VITAMIN D2) 50000 units capsule Take 1 capsule (50,000 Units total) by mouth once a week. 4 capsule 4   No facility-administered medications prior to visit.     Allergies  Allergen Reactions  . Sulfa Antibiotics Anaphylaxis  . Penicillins Rash    Review of Systems  Constitutional: Negative for fever.  Respiratory: Negative for shortness of breath.   Cardiovascular: Negative for chest pain.  Gastrointestinal: Positive for nausea. Negative for abdominal pain, heartburn and vomiting.  Genitourinary: Negative for pelvic pain.  Musculoskeletal: Positive for back pain.  Neurological: Negative for tingling, numbness and headaches.       Objective:    Physical Exam  Constitutional: She is oriented to person, place, and time. She appears well-developed and well-nourished. No distress.  HENT:  Head: Normocephalic and atraumatic.  Nose: Nose normal.  Eyes: Right eye exhibits no discharge. Left eye exhibits no discharge.  Neck: Normal range of motion. Neck  supple.  Cardiovascular: Normal rate and regular rhythm.   No murmur heard. Pulmonary/Chest: Effort normal and breath sounds normal.  Abdominal: Soft. Bowel sounds are normal. There is no tenderness.  Musculoskeletal: She exhibits no edema.  Neurological: She is alert and oriented to person, place, and time.  Skin: Skin is warm and dry.  Psychiatric: She has a normal mood and affect.  Nursing note and vitals reviewed.   BP (!) 96/58 (BP Location: Right Arm, Patient Position: Sitting, Cuff Size: Normal)   Pulse (!) 59   Temp 97.3 F (36.3 C) (Oral)   Wt 123 lb (55.8 kg)  SpO2 97%   BMI 24.02 kg/m  Wt Readings from Last 3 Encounters:  03/11/16 123 lb (55.8 kg)  02/09/16 124 lb (56.2 kg)  01/15/16 122 lb 12.8 oz (55.7 kg)     Lab Results  Component Value Date   WBC 5.9 01/15/2016   HGB 13.0 01/15/2016   HCT 38.7 01/15/2016   PLT 197 01/15/2016   GLUCOSE 82 01/15/2016   CHOL 189 01/01/2016   TRIG 69.0 01/01/2016   HDL 64.00 01/01/2016   LDLCALC 112 (H) 01/01/2016   ALT 11 01/15/2016   AST 19 01/15/2016   NA 137 01/15/2016   K 4.2 01/15/2016   CL 102 01/08/2016   CREATININE 0.8 01/15/2016   BUN 18.1 01/15/2016   CO2 27 01/15/2016   TSH 4.05 01/01/2016    Lab Results  Component Value Date   TSH 4.05 01/01/2016   Lab Results  Component Value Date   WBC 5.9 01/15/2016   HGB 13.0 01/15/2016   HCT 38.7 01/15/2016   MCV 94 01/15/2016   PLT 197 01/15/2016   Lab Results  Component Value Date   NA 137 01/15/2016   K 4.2 01/15/2016   CHLORIDE 103 01/15/2016   CO2 27 01/15/2016   GLUCOSE 82 01/15/2016   BUN 18.1 01/15/2016   CREATININE 0.8 01/15/2016   BILITOT 0.36 01/15/2016   ALKPHOS 62 01/15/2016   AST 19 01/15/2016   ALT 11 01/15/2016   PROT 7.8 01/15/2016   ALBUMIN 3.6 01/15/2016   CALCIUM 8.6 01/15/2016   ANIONGAP 7 01/15/2016   EGFR 90 (L) 01/15/2016   GFR 103.36 01/08/2016   Lab Results  Component Value Date   CHOL 189 01/01/2016   Lab  Results  Component Value Date   HDL 64.00 01/01/2016   Lab Results  Component Value Date   LDLCALC 112 (H) 01/01/2016   Lab Results  Component Value Date   TRIG 69.0 01/01/2016   Lab Results  Component Value Date   CHOLHDL 3 01/01/2016   No results found for: HGBA1C     Assessment & Plan:   Problem List Items Addressed This Visit    Vitamin D deficiency - Primary    Taking daily supplements and the once weekly dosing. Stop the 50000 IU tab and continue daily at 3000 to 5000 IU dose, recheck level      Relevant Orders   Vitamin D (25 hydroxy)   Muscle spasm    Encouraged moist heat and gentle stretching as tolerated. May try NSAIDs and prescription meds as directed and report if symptoms worsen or seek immediate care. Uses 1/4 tab methocarbimol prn with good results but since pain is present still after about 3 months will proceed with xray and send to PT      Relevant Orders   DG Lumbar Spine Complete   Ambulatory referral to Physical Therapy   HTN (hypertension), benign    Well controlled, no changes to meds. Encouraged heart healthy diet such as the DASH diet and exercise as tolerated.          I have discontinued Ms. Savini's ergocalciferol. I am also having her maintain her multivitamin, vitamin C, calcium carbonate, cetirizine, b complex vitamins, diphenhydrAMINE, LORazepam, methocarbamol, HYDROcodone-acetaminophen, ondansetron, levalbuterol, diltiazem, aspirin EC, hydrochlorothiazide, and beclomethasone.  No orders of the defined types were placed in this encounter.   CMA served as Education administrator during this visit. History, Physical and Plan performed by medical provider. Documentation and orders reviewed and attested to.  Penni Homans,  MD

## 2016-03-14 ENCOUNTER — Telehealth: Payer: Self-pay | Admitting: Family Medicine

## 2016-03-14 NOTE — Telephone Encounter (Signed)
°  Relation to [id number] Call back number:[phone] Pharmacy:  Reason for call: pt states a nurse called and left her a message, informing her to call back and make her aware of the last time she had gotten a rx for vitamin D, pt states her last rx for vitamin D was 02/27/2016. States rx # is [id number]. [facility] [id number] [place], [phone]

## 2016-03-18 ENCOUNTER — Ambulatory Visit: Payer: BLUE CROSS/BLUE SHIELD | Admitting: Physical Therapy

## 2016-03-25 ENCOUNTER — Ambulatory Visit: Payer: BLUE CROSS/BLUE SHIELD | Admitting: Physical Therapy

## 2016-03-25 DIAGNOSIS — I10 Essential (primary) hypertension: Secondary | ICD-10-CM | POA: Diagnosis not present

## 2016-03-25 DIAGNOSIS — R600 Localized edema: Secondary | ICD-10-CM | POA: Diagnosis not present

## 2016-03-25 DIAGNOSIS — R002 Palpitations: Secondary | ICD-10-CM | POA: Diagnosis not present

## 2016-03-25 DIAGNOSIS — R0602 Shortness of breath: Secondary | ICD-10-CM | POA: Diagnosis not present

## 2016-03-29 ENCOUNTER — Other Ambulatory Visit: Payer: Self-pay

## 2016-03-29 ENCOUNTER — Telehealth: Payer: Self-pay | Admitting: Pulmonary Disease

## 2016-03-29 MED ORDER — BECLOMETHASONE DIPROP HFA 40 MCG/ACT IN AERB: 2.0000 | INHALATION_SPRAY | Freq: Two times a day (BID) | RESPIRATORY_TRACT | 3 refills | Status: DC

## 2016-03-29 NOTE — Telephone Encounter (Signed)
lmtcb X1 for pt  

## 2016-03-29 NOTE — Telephone Encounter (Signed)
Received a fax from [facility] stating that the Qvar 40 HFA inhalers are in the process of being discounted. Patient needs a new RX for the Qvar Redihaler. New RX was sent in. Nothing else needed.

## 2016-03-30 NOTE — Telephone Encounter (Signed)
lmtcb x2 for pt. 

## 2016-03-31 NOTE — Telephone Encounter (Signed)
Spoke with pt. States that she is only using Qvar 1 puff daily. Pt just wanted to make [place] aware of this so that we may change it in her chart. Change has been made. Nothing further was needed.

## 2016-04-01 ENCOUNTER — Encounter: Payer: Self-pay | Admitting: Physical Therapy

## 2016-04-01 ENCOUNTER — Ambulatory Visit: Payer: BLUE CROSS/BLUE SHIELD | Attending: Family Medicine | Admitting: Physical Therapy

## 2016-04-01 DIAGNOSIS — M62838 Other muscle spasm: Secondary | ICD-10-CM | POA: Diagnosis not present

## 2016-04-01 DIAGNOSIS — M545 Low back pain: Secondary | ICD-10-CM | POA: Insufficient documentation

## 2016-04-01 DIAGNOSIS — G8929 Other chronic pain: Secondary | ICD-10-CM | POA: Insufficient documentation

## 2016-04-01 NOTE — Therapy (Addendum)
[place], [place], [address] Phone: [phone]   Fax:  [phone]  Physical Therapy Evaluation / Discharge summary  Patient Details  Name: Gail Levine MRN: [id number] Date of Birth: 05/15/1965 Referring Provider: Gwyneth Revels MD  Encounter Date: 04/01/2016      PT End of Session - 04/01/16 1229    Visit Number 1   Number of Visits 6   Date for PT Re-Evaluation 05/13/16   PT Start Time 1100   PT Stop Time 1148   PT Time Calculation (min) 48 min   Activity Tolerance Patient tolerated treatment well   Behavior During Therapy [facility] for tasks assessed/performed      Past Medical History:  Diagnosis Date  . Acute bronchitis 09/09/2015  . Allergy   . Arthritis   . Asthma   . Asthma 09/09/2015  . Back pain 01/08/2016  . Breast cancer, left breast ([place])    2007   . Cancer ([place])    2007  . Cervical cancer screening 01/01/2015  . Constipation 11/23/2015  . Dermatitis 01/04/2015  . Estrogen deficient vulvovaginitis 09/14/2014  . HIV infection ([place])   . HTN (hypertension), benign 11/23/2015  . Hyperlipidemia   . Positive PPD   . Pruritus 09/04/2014  . Thyroid disease   . Vitamin D deficiency     Past Surgical History:  Procedure Laterality Date  . MASTECTOMY     left breast    There were no vitals filed for this visit.       Subjective Assessment - 04/01/16 1114    Subjective pt is a 51 y.o F with CC of low back pain that started non-traumatically that began November 26 of 2017. Since onset it has gotten better and has been pain free for the last 2 weeks. when she did have pain she reported pain was located in R low back with no referral of pain.    Pertinent History hx of cx   Limitations Lifting;Standing;Walking;House hold activities  only when she has pain   How long can you sit comfortably? unlimited   How long can you stand comfortably? unlimited   How long can you walk comfortably? unlimited    Diagnostic tests x-ray   Patient Stated Goals figure out whats going on and how to avoid it.    Currently in Pain? Yes   Pain Score 0-No pain   Pain Location Back   Pain Orientation Right   Pain Descriptors / Indicators Aching;Sharp  when it does hurt   Pain Type Chronic pain   Pain Onset More than a month ago   Pain Frequency Occasional   Aggravating Factors  when she has pain, using the restroom (self hygiene), all trunk motions   Pain Relieving Factors heating pad, topical rubs, over the counter medications            [facility] PT Assessment - 04/01/16 1109      Assessment   Medical Diagnosis Muscle spasm   Referring Provider Gwyneth Revels MD   Onset Date/Surgical Date --  6 months ago   Hand Dominance Right   Next MD Visit --  3-4 months   Prior Therapy no     Precautions   Precautions None     Restrictions   Weight Bearing Restrictions No     Balance Screen   Has the patient fallen in the past 6 months No   Has the patient had a decrease in activity level because of  a fear of falling?  No   Is the patient reluctant to leave their home because of a fear of falling?  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   Vocation Requirements standing./ walking/ sitting/ twisting/ moving   Leisure hanging out family     Cognition   Overall Cognitive Status Within Functional Limits for tasks assessed     Observation/Other Assessments   Focus on Therapeutic Outcomes (FOTO)  6% limited     Posture/Postural Control   Posture/Postural Control Postural limitations    Postural Limitations Rounded Shoulders;Forward head     ROM / Strength   AROM / PROM / Strength AROM;Strength     AROM   AROM Assessment Site Lumbar   Lumbar Flexion 98  ERP   Lumbar Extension 30  ERP   Lumbar - Right Side Bend 38   Lumbar - Left Side Bend 42     Palpation   Spinal mobility L3-L4 PA hypomobility PAIVM with pain upon palpation   Palpation comment tendereness in bil lumbar paraspinals R>L tightness in bil QL. Pain at the R PSIS, tightness in bil piriformis      Special Tests    Special Tests Lumbar;Sacrolliac Tests   Lumbar Tests Slump Test;Prone Knee Bend Test;Straight Leg Raise   Sacroiliac Tests  Gaenslen's Test  flexion/ extension     Slump test   Findings Not tested     Prone Knee Bend Test   Findings Negative     Straight Leg Raise   Findings Positive   Side  Right     Sacral thrust    Findings Positive   Side Right     Gaenslen's test   Findings Negative   Comments soreness felt in piriformis bil                           PT Education - 04/01/16 1228    Education provided Yes   Education Details POC, goals, HEP with proper form/ treatment rationale. anatomy of the lumbar spine and piriformis    Person(s) Educated Patient   Methods Explanation;Verbal cues;Handout   Comprehension Verbalized understanding;Verbal cues required          PT Short Term Goals - 04/01/16 1235      PT SHORT TERM GOAL #1   Title pt will be I with inital HEP (04/22/2016)   Time 3   Period Weeks   Status New           PT Long Term Goals - 04/01/16 1235      PT LONG TERM GOAL #1   Title pt will be I with all HEP given as of last visit (05/13/2016)   Time 6   Period Weeks   Status New     PT LONG TERM GOAL #2   Title she will be able to verbalize and demo proper posture and lifting mechanics to prevent and reduce hip/ low back pain (05/13/2016)   Time 6   Period Weeks  Status New     PT LONG TERM GOAL #3   Title She will  demonstrate no muscle tightness in the low back / hip to reduce pain and promote pain free trunk mobility for work related tasks and ADLS (1/88/4166)   Time 6   Period Weeks   Status New     PT LONG TERM GOAL #4   Title she will increase her FOTO score to </= 5% limited to demonstrate improvement in function (05/13/2016)   Time 6   Period Weeks   Status New               Plan - 04/01/16 1229    Clinical Impression Statement Mrs [place] presents to OPPT as a low complexity evaluation with CC of Low back pain. She demonstrates functional trunk mobility with end range pain. tightness noted in bil piriformis muscles with soreness at the L3-L4 vertebrae. She would benefit from physical therapy to decrease muscle tightness, reduce pain returning to PLOF by addressing the deficits listed.    Rehab Potential Good   PT Frequency 1x / week   PT Duration 6 weeks   PT Treatment/Interventions ADLs/Self Care Home Management;Cryotherapy;Electrical Stimulation;Iontophoresis 56m/ml Dexamethasone;Moist Heat;Ultrasound;Traction;Dry needling;Taping;Passive range of motion;Manual techniques;Therapeutic exercise;Therapeutic activities;Patient/family education   PT Next Visit Plan assess/ review HEP, manual over piriformis region/ lumbar region, DN? posture education   PT Home Exercise Plan piriformis stretch, childs pose, hamstring stretch   Consulted and Agree with Plan of Care Patient      Patient will benefit from skilled therapeutic intervention in order to improve the following deficits and impairments:  Pain, Improper body mechanics, Postural dysfunction, Decreased endurance, Decreased activity tolerance, Decreased balance, Abnormal gait, Difficulty walking, Decreased strength, Increased edema, Increased fascial restricitons  Visit Diagnosis: Chronic right-sided low back pain, with sciatica presence unspecified - Plan: PT plan of care cert/re-cert  Other muscle spasm - Plan: PT plan of care  cert/re-cert     Problem List Patient Active Problem List   Diagnosis Date Noted  . Back pain 01/08/2016  . Constipation 11/23/2015  . HTN (hypertension), benign 11/23/2015  . Chest pain 09/10/2015  . Muscle spasm 09/09/2015  . Palpitations 09/09/2015  . Asthma 09/09/2015  . Cervical cancer screening 01/01/2015  . Estrogen deficient vulvovaginitis 09/14/2014  . Malignant neoplasm of nipple of left breast in female, estrogen receptor negative ([place]   . Positive PPD   . Hyperlipidemia   . Pruritus 09/04/2014  . Vitamin D deficiency   . Thyroid disease    KStarr LakePT, DPT, LAT, ATC  04/01/16  12:45 PM   [facility] Center183 [address]GMead N[place] [address]Phone: [phone]  Fax:  [phone] Name: KRaneishaSee MRN: [id number] of Birth: 1Feb 26, 1967    PHYSICAL THERAPY DISCHARGE SUMMARY  Visits from Start of Care: 1  Current functional level related to goals / functional outcomes: [place] goals   Remaining deficits: unknown   Education / Equipment: HEP  Plan: Patient agrees to discharge.  Patient goals were not met. Patient is being discharged due to not returning since the last visit.  ?????

## 2016-04-05 ENCOUNTER — Other Ambulatory Visit: Payer: Self-pay

## 2016-04-05 MED ORDER — BECLOMETHASONE DIPROP HFA 40 MCG/ACT IN AERB: 1.0000 | INHALATION_SPRAY | Freq: Every day | RESPIRATORY_TRACT | 3 refills | Status: DC

## 2016-05-15 ENCOUNTER — Encounter: Payer: Self-pay | Admitting: Family Medicine

## 2016-05-16 ENCOUNTER — Other Ambulatory Visit: Payer: Self-pay | Admitting: Family Medicine

## 2016-05-16 DIAGNOSIS — M109 Gout, unspecified: Secondary | ICD-10-CM

## 2016-05-17 ENCOUNTER — Other Ambulatory Visit (INDEPENDENT_AMBULATORY_CARE_PROVIDER_SITE_OTHER): Payer: BLUE CROSS/BLUE SHIELD

## 2016-05-17 ENCOUNTER — Encounter: Payer: Self-pay | Admitting: Family Medicine

## 2016-05-17 DIAGNOSIS — M109 Gout, unspecified: Secondary | ICD-10-CM | POA: Diagnosis not present

## 2016-05-17 LAB — URIC ACID: URIC ACID, SERUM: 6.3 mg/dL (ref 2.4–7.0)

## 2016-05-18 ENCOUNTER — Other Ambulatory Visit: Payer: Self-pay | Admitting: Family Medicine

## 2016-05-18 DIAGNOSIS — E790 Hyperuricemia without signs of inflammatory arthritis and tophaceous disease: Secondary | ICD-10-CM

## 2016-05-18 MED ORDER — ALLOPURINOL 100 MG PO TABS: 100.0000 mg | ORAL_TABLET | Freq: Every day | ORAL | 1 refills | Status: DC

## 2016-06-03 ENCOUNTER — Encounter: Payer: Self-pay | Admitting: Family Medicine

## 2016-06-03 ENCOUNTER — Ambulatory Visit (INDEPENDENT_AMBULATORY_CARE_PROVIDER_SITE_OTHER): Payer: BLUE CROSS/BLUE SHIELD | Admitting: Family Medicine

## 2016-06-03 VITALS — BP 116/70 | HR 57 | Temp 98.2°F | Wt 124.2 lb

## 2016-06-03 DIAGNOSIS — J45909 Unspecified asthma, uncomplicated: Secondary | ICD-10-CM

## 2016-06-03 DIAGNOSIS — E559 Vitamin D deficiency, unspecified: Secondary | ICD-10-CM | POA: Diagnosis not present

## 2016-06-03 DIAGNOSIS — I10 Essential (primary) hypertension: Secondary | ICD-10-CM

## 2016-06-03 DIAGNOSIS — E079 Disorder of thyroid, unspecified: Secondary | ICD-10-CM | POA: Diagnosis not present

## 2016-06-03 DIAGNOSIS — Z0000 Encounter for general adult medical examination without abnormal findings: Secondary | ICD-10-CM | POA: Diagnosis not present

## 2016-06-03 DIAGNOSIS — E785 Hyperlipidemia, unspecified: Secondary | ICD-10-CM | POA: Diagnosis not present

## 2016-06-03 DIAGNOSIS — M109 Gout, unspecified: Secondary | ICD-10-CM | POA: Diagnosis not present

## 2016-06-03 HISTORY — DX: Gout, unspecified: M10.9

## 2016-06-03 NOTE — Progress Notes (Signed)
Patient ID: Gail Levine, female   DOB: 29-Apr-1965, 51 y.o.   MRN: [id number]   Subjective:  I acted as a Education administrator for Penni Homans, [place], [place]   Patient ID: Gail Levine, female    DOB: Sep 30, 1965, 51 y.o.   MRN: [id number]  Chief Complaint  Patient presents with  . Vitamin D Deficiency    13-monthF/U.    HPI  Patient is in today for a 380-monthollow up for Vitamin D deficiency. Patient has a complaint of a gout flare up. Patient has a Hx of gout, Vitamin D deficiency. Patient has no additional acute concerns noted at this time. Her breathing is much better and she is using the Xopenex infrequently at this time. She reports her blood pressure has been much better controled. Denies CP/palp/SOB/HA/congestion/fevers/GI or GU c/o. Taking meds as prescribed   Patient Care Team: BlMosie LukesMD as PCP - General (Family Medicine)   Past Medical History:  Diagnosis Date  . Acute bronchitis 09/09/2015  . Allergy   . Arthritis   . Asthma   . Asthma 09/09/2015  . Back pain 01/08/2016  . Breast cancer, left breast ([facility]   2007   . Cancer ([facility]   2007  . Cervical cancer screening 01/01/2015  . Constipation 11/23/2015  . Dermatitis 01/04/2015  . Estrogen deficient vulvovaginitis 09/14/2014  . Gout   . Gout 06/03/2016  . HIV infection ([facility]  . HTN (hypertension), benign 11/23/2015  . Hyperlipidemia   . Positive PPD   . Pruritus 09/04/2014  . Thyroid disease   . Vitamin D deficiency     Past Surgical History:  Procedure Laterality Date  . MASTECTOMY     left breast    Family History  Problem Relation Age of Onset  . Cancer Mother     cancer  . Hypertension Father   . Stroke Father   . Heart disease Father   . Diabetes Father     Social History   Social History  . Marital status: Single    Spouse name: N/A  . Number of children: N/A  . Years of education: N/A   Occupational History  . patient care coordinator    Social History Main Topics  . Smoking status: Never Smoker   . Smokeless tobacco: Never Used  . Alcohol use 0.0 oz/week  . Drug use: No  . Sexual activity: No   Other Topics Concern  . Not on file   Social History Narrative  . No narrative on file    Outpatient Medications Prior to Visit  Medication Sig Dispense Refill  . allopurinol (ZYLOPRIM) 100 MG tablet Take 1 tablet (100 mg total) by mouth daily. 30 tablet 1  . Ascorbic Acid (VITAMIN C) 1000 MG tablet Take 1,000 mg by mouth daily.    . Marland Kitchenspirin EC 81 MG tablet Take 1 tablet (81 mg total) by mouth daily.    . Marland Kitchen complex vitamins tablet Take 1 tablet by mouth daily.    . Beclomethasone Diprop HFA (QVAR REDIHALER) 40 MCG/ACT AERB Inhale 1 puff into the lungs daily. 1 Inhaler 3  . calcium carbonate (OS-CAL) 600 MG TABS tablet Take 1,800 mg by mouth 2 (two) times daily with a meal.    . cetirizine (ZYRTEC) 10 MG tablet Take 10 mg by mouth daily.    . Marland Kitcheniltiazem (CARDIZEM) 30 MG tablet Take 1 tablet (30 mg total) by mouth 3 (three) times daily.    . diphenhydrAMINE (BENADRYL)  25 MG tablet Take 25 mg by mouth every 6 (six) hours as needed for itching.     . hydrochlorothiazide (HYDRODIURIL) 25 MG tablet 1/2 tablet by mouth every day    . HYDROcodone-acetaminophen (NORCO) 5-325 MG tablet Take 1 tablet by mouth every 6 (six) hours as needed for moderate pain. 8 tablet 0  . levalbuterol (XOPENEX HFA) 45 MCG/ACT inhaler Inhale 1 puff into the lungs every 6 (six) hours as needed for wheezing or shortness of breath. Fill as daw. They do have permission to fill either but go ahead and rewrite. 1 Inhaler 5  . LORazepam (ATIVAN) 0.5 MG tablet Take 1 tablet (0.5 mg total) by mouth 2 (two) times daily as needed for anxiety. 10 tablet 1  . methocarbamol (ROBAXIN) 500 MG tablet Take 1 tablet (500 mg total) by mouth every 8 (eight) hours as needed for muscle spasms. 30 tablet 1  . Multiple Vitamin (MULTIVITAMIN) tablet Take 1 tablet by mouth daily.    . ondansetron (ZOFRAN) 4 MG tablet Take 1 tablet (4 mg total)  by mouth every 8 (eight) hours as needed for nausea or vomiting. 30 tablet 1   No facility-administered medications prior to visit.     Allergies  Allergen Reactions  . Sulfa Antibiotics Anaphylaxis  . Penicillins Rash    Review of Systems  Constitutional: Negative for fever and malaise/fatigue.  HENT: Negative for congestion.   Eyes: Negative for blurred vision.  Respiratory: Negative for cough and shortness of breath.   Cardiovascular: Negative for chest pain, palpitations and leg swelling.  Gastrointestinal: Negative for vomiting.  Musculoskeletal: Negative for back pain.  Skin: Negative for rash.  Neurological: Negative for loss of consciousness and headaches.       Objective:    Physical Exam  Constitutional: She is oriented to person, place, and time. She appears well-developed and well-nourished. No distress.  HENT:  Head: Normocephalic and atraumatic.  Eyes: Conjunctivae are normal.  Neck: Normal range of motion. No thyromegaly present.  Cardiovascular: Normal rate and regular rhythm.   Pulmonary/Chest: Effort normal and breath sounds normal. She has no wheezes.  Abdominal: Soft. Bowel sounds are normal. There is no tenderness.  Musculoskeletal: She exhibits no edema or deformity.  Neurological: She is alert and oriented to person, place, and time.  Skin: Skin is warm and dry. She is not diaphoretic.  Psychiatric: She has a normal mood and affect.    BP 116/70 (BP Location: Right Arm, Patient Position: Sitting, Cuff Size: Normal)   Pulse (!) 57   Temp 98.2 F (36.8 C) (Oral)   Wt 124 lb 3.2 oz (56.3 kg)   SpO2 99% Comment: RA  BMI 24.26 kg/m  Wt Readings from Last 3 Encounters:  06/03/16 124 lb 3.2 oz (56.3 kg)  03/11/16 123 lb (55.8 kg)  02/09/16 124 lb (56.2 kg)   BP Readings from Last 3 Encounters:  06/03/16 116/70  03/11/16 (!) 96/58  02/09/16 106/60      There is no immunization history on file for this patient.  Health Maintenance  Topic  Date Due  . HIV Screening  12/03/1982  . TETANUS/TDAP  09/03/2016 (Originally 12/03/1986)  . INFLUENZA VACCINE  12/19/2016 (Originally 08/31/2016)  . MAMMOGRAM  10/14/2016  . PAP SMEAR  12/31/2017    Lab Results  Component Value Date   WBC 5.9 01/15/2016   HGB 13.0 01/15/2016   HCT 38.7 01/15/2016   PLT 197 01/15/2016   GLUCOSE 82 01/15/2016   CHOL 189 01/01/2016  TRIG 69.0 01/01/2016   HDL 64.00 01/01/2016   LDLCALC 112 (H) 01/01/2016   ALT 11 01/15/2016   AST 19 01/15/2016   NA 137 01/15/2016   K 4.2 01/15/2016   CL 102 01/08/2016   CREATININE 0.8 01/15/2016   BUN 18.1 01/15/2016   CO2 27 01/15/2016   TSH 4.05 01/01/2016    Lab Results  Component Value Date   TSH 4.05 01/01/2016   Lab Results  Component Value Date   WBC 5.9 01/15/2016   HGB 13.0 01/15/2016   HCT 38.7 01/15/2016   MCV 94 01/15/2016   PLT 197 01/15/2016   Lab Results  Component Value Date   NA 137 01/15/2016   K 4.2 01/15/2016   CHLORIDE 103 01/15/2016   CO2 27 01/15/2016   GLUCOSE 82 01/15/2016   BUN 18.1 01/15/2016   CREATININE 0.8 01/15/2016   BILITOT 0.36 01/15/2016   ALKPHOS 62 01/15/2016   AST 19 01/15/2016   ALT 11 01/15/2016   PROT 7.8 01/15/2016   ALBUMIN 3.6 01/15/2016   CALCIUM 8.6 01/15/2016   ANIONGAP 7 01/15/2016   EGFR 90 (L) 01/15/2016   GFR 103.36 01/08/2016   Lab Results  Component Value Date   CHOL 189 01/01/2016   Lab Results  Component Value Date   HDL 64.00 01/01/2016   Lab Results  Component Value Date   LDLCALC 112 (H) 01/01/2016   Lab Results  Component Value Date   TRIG 69.0 01/01/2016   Lab Results  Component Value Date   CHOLHDL 3 01/01/2016   No results found for: HGBA1C       Assessment & Plan:   Problem List Items Addressed This Visit    Vitamin D deficiency - Primary    Tolerating supplements, continue to monitor      Relevant Orders   Vitamin D (25 hydroxy)   VITAMIN D 25 Hydroxy (Vit-D Deficiency, Fractures)   VITAMIN D 25  Hydroxy (Vit-D Deficiency, Fractures)   Thyroid disease   Relevant Orders   TSH   TSH   Hyperlipidemia    Encouraged heart healthy diet, increase exercise, avoid trans fats, consider a krill oil cap daily      Relevant Orders   Lipid panel   Lipid panel   Asthma    Doing well without recent flares. Is using Xopenex infrequently      HTN (hypertension), benign    Well controlled, no changes to meds. Encouraged heart healthy diet such as the DASH diet and exercise as tolerated.       Relevant Orders   CBC   Comprehensive metabolic panel   CBC   Comprehensive metabolic panel   Gout   Relevant Orders   Uric acid   Uric acid    Other Visit Diagnoses    Preventative health care       Relevant Orders   CBC   Comprehensive metabolic panel   Lipid panel   TSH   Uric acid   VITAMIN D 25 Hydroxy (Vit-D Deficiency, Fractures)      I am having Ms. Frappier maintain her multivitamin, vitamin C, calcium carbonate, cetirizine, b complex vitamins, diphenhydrAMINE, LORazepam, methocarbamol, HYDROcodone-acetaminophen, ondansetron, levalbuterol, diltiazem, aspirin EC, hydrochlorothiazide, Beclomethasone Diprop HFA, and allopurinol.  No orders of the defined types were placed in this encounter.   CMA served as Education administrator during this visit. History, Physical and Plan performed by medical provider. Documentation and orders reviewed and attested to.  Penni Homans, MD

## 2016-06-03 NOTE — Patient Instructions (Signed)
Vitamin D Deficiency Vitamin D deficiency is when your body does not have enough vitamin D. Vitamin D is important because:  It helps your body use other minerals that your body needs.  It helps keep your bones strong and healthy.  It may help to prevent some diseases.  It helps your heart and other muscles work well. You can get vitamin D by:  Eating foods with vitamin D in them.  Drinking or eating milk or other foods that have had vitamin D added to them.  Taking a vitamin D supplement.  Being in the sun. Not getting enough vitamin D can make your bones become soft. It can also cause other health problems. Follow these instructions at home:  Take medicines and supplements only as told by your doctor.  Eat foods that have vitamin D. These include:  Dairy products, cereals, or juices with added vitamin D. Check the label for vitamin D.  Fatty fish like salmon or trout.  Eggs.  Oysters.  Do not use tanning beds.  Stay at a healthy weight. Lose weight, if needed.  Keep all follow-up visits as told by your doctor. This is important. Contact a doctor if:  Your symptoms do not go away.  You feel sick to your stomach (nauseous).  Youthrow up (vomit).  You poop less often than usual or you have trouble pooping (constipation). This information is not intended to replace advice given to you by your health care provider. Make sure you discuss any questions you have with your health care provider. Document Released: 01/06/2011 Document Revised: 06/25/2015 Document Reviewed: 06/04/2014 Elsevier Interactive Patient Education  2017 [employer].

## 2016-06-03 NOTE — Progress Notes (Signed)
Pre visit review using our clinic review tool, if applicable. No additional management support is needed unless otherwise documented below in the visit note. 

## 2016-06-06 NOTE — Assessment & Plan Note (Signed)
Well controlled, no changes to meds. Encouraged heart healthy diet such as the DASH diet and exercise as tolerated.  °

## 2016-06-06 NOTE — Assessment & Plan Note (Signed)
Tolerating supplements, continue to monitor

## 2016-06-06 NOTE — Assessment & Plan Note (Signed)
Encouraged heart healthy diet, increase exercise, avoid trans fats, consider a krill oil cap daily 

## 2016-06-06 NOTE — Assessment & Plan Note (Signed)
Doing well without recent flares. Is using Xopenex infrequently

## 2016-06-10 ENCOUNTER — Other Ambulatory Visit (INDEPENDENT_AMBULATORY_CARE_PROVIDER_SITE_OTHER): Payer: BLUE CROSS/BLUE SHIELD

## 2016-06-10 ENCOUNTER — Encounter: Payer: Self-pay | Admitting: Family Medicine

## 2016-06-10 DIAGNOSIS — I10 Essential (primary) hypertension: Secondary | ICD-10-CM | POA: Diagnosis not present

## 2016-06-10 DIAGNOSIS — E079 Disorder of thyroid, unspecified: Secondary | ICD-10-CM | POA: Diagnosis not present

## 2016-06-10 DIAGNOSIS — Z0000 Encounter for general adult medical examination without abnormal findings: Secondary | ICD-10-CM | POA: Diagnosis not present

## 2016-06-10 DIAGNOSIS — E785 Hyperlipidemia, unspecified: Secondary | ICD-10-CM | POA: Diagnosis not present

## 2016-06-10 DIAGNOSIS — E559 Vitamin D deficiency, unspecified: Secondary | ICD-10-CM

## 2016-06-10 DIAGNOSIS — M109 Gout, unspecified: Secondary | ICD-10-CM

## 2016-06-10 LAB — LIPID PANEL
CHOL/HDL RATIO: 3
CHOLESTEROL: 185 mg/dL (ref 0–200)
HDL: 62.5 mg/dL (ref >39.00)
LDL CALC: 100 mg/dL — AB (ref 0–99)
NonHDL: 122.11
Triglycerides: 113 mg/dL (ref 0.0–149.0)
VLDL: 22.6 mg/dL (ref 0.0–40.0)

## 2016-06-10 LAB — COMPREHENSIVE METABOLIC PANEL
ALBUMIN: 3.9 g/dL (ref 3.5–5.2)
ALT: 12 U/L (ref 0–35)
AST: 18 U/L (ref 0–37)
Alkaline Phosphatase: 45 U/L (ref 39–117)
BUN: 9 mg/dL (ref 6–23)
CHLORIDE: 104 meq/L (ref 96–112)
CO2: 29 meq/L (ref 19–32)
Calcium: 8.9 mg/dL (ref 8.4–10.5)
Creatinine, Ser: 0.64 mg/dL (ref 0.40–1.20)
GFR: 105.04 mL/min (ref >60.00)
Glucose, Bld: 86 mg/dL (ref 70–99)
POTASSIUM: 3.8 meq/L (ref 3.5–5.1)
Sodium: 139 mEq/L (ref 135–145)
Total Bilirubin: 0.8 mg/dL (ref 0.2–1.2)
Total Protein: 7.5 g/dL (ref 6.0–8.3)

## 2016-06-10 LAB — CBC
HEMATOCRIT: 39.3 % (ref 36.0–46.0)
HEMOGLOBIN: 13.1 g/dL (ref 12.0–15.0)
MCHC: 33.5 g/dL (ref 30.0–36.0)
MCV: 95.4 fl (ref 78.0–100.0)
Platelets: 194 10*3/uL (ref 150.0–400.0)
RBC: 4.12 Mil/uL (ref 3.87–5.11)
RDW: 13.2 % (ref 11.5–15.5)
WBC: 5 10*3/uL (ref 4.0–10.5)

## 2016-06-10 LAB — TSH: TSH: 4.44 u[IU]/mL (ref 0.35–4.50)

## 2016-06-10 LAB — VITAMIN D 25 HYDROXY (VIT D DEFICIENCY, FRACTURES): VITD: 25.91 ng/mL — AB (ref 30.00–100.00)

## 2016-06-10 LAB — URIC ACID: Uric Acid, Serum: 5.8 mg/dL (ref 2.4–7.0)

## 2016-06-14 ENCOUNTER — Encounter: Payer: Self-pay | Admitting: Family Medicine

## 2016-07-01 ENCOUNTER — Other Ambulatory Visit (HOSPITAL_BASED_OUTPATIENT_CLINIC_OR_DEPARTMENT_OTHER): Payer: BLUE CROSS/BLUE SHIELD

## 2016-07-01 ENCOUNTER — Encounter: Payer: Self-pay | Admitting: *Deleted

## 2016-07-01 ENCOUNTER — Ambulatory Visit (HOSPITAL_BASED_OUTPATIENT_CLINIC_OR_DEPARTMENT_OTHER): Payer: BLUE CROSS/BLUE SHIELD | Admitting: Hematology & Oncology

## 2016-07-01 VITALS — BP 116/66 | HR 55 | Temp 97.8°F | Resp 17 | Wt 123.0 lb

## 2016-07-01 DIAGNOSIS — Z853 Personal history of malignant neoplasm of breast: Secondary | ICD-10-CM | POA: Diagnosis not present

## 2016-07-01 DIAGNOSIS — E559 Vitamin D deficiency, unspecified: Secondary | ICD-10-CM

## 2016-07-01 DIAGNOSIS — C50012 Malignant neoplasm of nipple and areola, left female breast: Secondary | ICD-10-CM

## 2016-07-01 DIAGNOSIS — Z171 Estrogen receptor negative status [ER-]: Principal | ICD-10-CM

## 2016-07-01 LAB — COMPREHENSIVE METABOLIC PANEL
ALBUMIN: 4 g/dL (ref 3.5–5.0)
ALK PHOS: 56 U/L (ref 40–150)
ALT: 15 U/L (ref 0–55)
ANION GAP: 7 meq/L (ref 3–11)
AST: 22 U/L (ref 5–34)
BUN: 10.7 mg/dL (ref 7.0–26.0)
CALCIUM: 9.3 mg/dL (ref 8.4–10.4)
CHLORIDE: 104 meq/L (ref 98–109)
CO2: 29 mEq/L (ref 22–29)
CREATININE: 0.8 mg/dL (ref 0.6–1.1)
EGFR: 90 mL/min/{1.73_m2} — ABNORMAL LOW (ref >90)
Glucose: 102 mg/dl (ref 70–140)
POTASSIUM: 3.8 meq/L (ref 3.5–5.1)
Sodium: 141 mEq/L (ref 136–145)
Total Bilirubin: 0.71 mg/dL (ref 0.20–1.20)
Total Protein: 8 g/dL (ref 6.4–8.3)

## 2016-07-01 LAB — CBC WITH DIFFERENTIAL (CANCER CENTER ONLY)
BASO#: 0 10*3/uL (ref 0.0–0.2)
BASO%: 0.7 % (ref 0.0–2.0)
EOS ABS: 0.3 10*3/uL (ref 0.0–0.5)
EOS%: 7.2 % — ABNORMAL HIGH (ref 0.0–7.0)
HEMATOCRIT: 42.3 % (ref 34.8–46.6)
HGB: 14.2 g/dL (ref 11.6–15.9)
LYMPH#: 1.4 10*3/uL (ref 0.9–3.3)
LYMPH%: 33.7 % (ref 14.0–48.0)
MCH: 32.3 pg (ref 26.0–34.0)
MCHC: 33.6 g/dL (ref 32.0–36.0)
MCV: 96 fL (ref 81–101)
MONO#: 0.5 10*3/uL (ref 0.1–0.9)
MONO%: 12.2 % (ref 0.0–13.0)
NEUT#: 1.9 10*3/uL (ref 1.5–6.5)
NEUT%: 46.2 % (ref 39.6–80.0)
PLATELETS: 178 10*3/uL (ref 145–400)
RBC: 4.4 10*6/uL (ref 3.70–5.32)
RDW: 11.8 % (ref 11.1–15.7)
WBC: 4.2 10*3/uL (ref 3.9–10.0)

## 2016-07-01 LAB — LACTATE DEHYDROGENASE: LDH: 182 U/L (ref 125–245)

## 2016-07-01 NOTE — Progress Notes (Signed)
Hematology and Oncology Follow Up Visit  Gail Levine [id number] [birth year] 50 y.o. 07/01/2016   Principle Diagnosis:  Stage IIIC (T2N3) grade 3 IDC of the left breast - Triple negative, BRCA negative S/p left mastectomy August, 2007  Current Therapy:   Observation    Interim History:  Gail Levine is here today for follow-up. She's doing well. We last saw her back in December. Since then, she's been doing quite well. She is now out almost 11 years. I think that with triple negative disease, her chance of recurrence now is less than 1%.  She feels well. She is trying to exercise. She has had no problems with nausea or vomiting. There's been no cough. She's had no leg swelling. She's had a little bit of lymphedema of the left arm. She takes hydrochlorothiazide.  She is taking vitamin D.  She's had no rashes.  She got to the wintertime without any influenza.   Overall, her performance status is ECOG 1.     Medications:  Allergies as of 07/01/2016      Reactions   Sulfa Antibiotics Anaphylaxis   Penicillins Rash      Medication List       Accurate as of 07/01/16  8:43 AM. Always use your most recent med list.          allopurinol 100 MG tablet Commonly known as:  ZYLOPRIM Take 1 tablet (100 mg total) by mouth daily.   aspirin EC 81 MG tablet Take 1 tablet (81 mg total) by mouth daily.   b complex vitamins tablet Take 1 tablet by mouth daily.   beclomethasone 40 MCG/ACT inhaler Commonly known as:  QVAR REDIHALER Inhale 1 puff into the lungs daily.   calcium carbonate 600 MG Tabs tablet Commonly known as:  OS-CAL Take 1,800 mg by mouth 2 (two) times daily with a meal.   CARDIZEM 30 MG tablet Generic drug:  diltiazem Take 1 tablet (30 mg total) by mouth 3 (three) times daily.   cetirizine 10 MG tablet Commonly known as:  ZYRTEC Take 10 mg by mouth daily.   diphenhydrAMINE 25 MG tablet Commonly known as:  BENADRYL Take 25 mg by mouth every 6 (six) hours as needed  for itching.   hydrochlorothiazide 25 MG tablet Commonly known as:  HYDRODIURIL 1/2 tablet by mouth every day   HYDROcodone-acetaminophen 5-325 MG tablet Commonly known as:  NORCO Take 1 tablet by mouth every 6 (six) hours as needed for moderate pain.   levalbuterol 45 MCG/ACT inhaler Commonly known as:  XOPENEX HFA Inhale 1 puff into the lungs every 6 (six) hours as needed for wheezing or shortness of breath. Fill as daw. They do have permission to fill either but go ahead and rewrite.   LORazepam 0.5 MG tablet Commonly known as:  ATIVAN Take 1 tablet (0.5 mg total) by mouth 2 (two) times daily as needed for anxiety.   methocarbamol 500 MG tablet Commonly known as:  ROBAXIN Take 1 tablet (500 mg total) by mouth every 8 (eight) hours as needed for muscle spasms.   multivitamin tablet Take 1 tablet by mouth daily.   ondansetron 4 MG tablet Commonly known as:  ZOFRAN Take 1 tablet (4 mg total) by mouth every 8 (eight) hours as needed for nausea or vomiting.   vitamin C 1000 MG tablet Take 1,000 mg by mouth daily.   Vitamin D (Ergocalciferol) 50000 units Caps capsule Commonly known as:  DRISDOL       Allergies:  Allergies  Allergen Reactions  . Sulfa Antibiotics Anaphylaxis  . Penicillins Rash    Past Medical History, Surgical history, Social history, and Family History were reviewed and updated.  Review of Systems: All other 10 point review of systems is negative.   Physical Exam:  weight is 123 lb (55.8 kg). Her oral temperature is 97.8 F (36.6 C). Her blood pressure is 116/66 and her pulse is 55 (abnormal). Her respiration is 17 and oxygen saturation is 100%.   Wt Readings from Last 3 Encounters:  07/01/16 123 lb (55.8 kg)  06/03/16 124 lb 3.2 oz (56.3 kg)  03/11/16 123 lb (55.8 kg)    Ocular: Sclerae unicteric, pupils equal, round and reactive to light Ear-nose-throat: Oropharynx clear, dentition fair Lymphatic: No cervical supraclavicular or axillary  adenopathy Lungs no rales or rhonchi, good excursion bilaterally Heart regular rate and rhythm, no murmur appreciated Abd soft, nontender, positive bowel sounds MSK no focal spinal tenderness, no joint edema Neuro: non-focal, well-oriented, appropriate affect Breasts: Left breast mastectomy. Well-healed mastectomy scar. There is some tenderness in the lateral aspect of the mastectomy scar. There is no adenopathy in the left axilla. No changes with the right breast. No mass, lesion, rash or lymphadenopathy found on exam.   Lab Results  Component Value Date   WBC 4.2 07/01/2016   HGB 14.2 07/01/2016   HCT 42.3 07/01/2016   MCV 96 07/01/2016   PLT 178 07/01/2016   No results found for: FERRITIN, IRON, TIBC, UIBC, IRONPCTSAT Lab Results  Component Value Date   RBC 4.40 07/01/2016   No results found for: KPAFRELGTCHN, LAMBDASER, KAPLAMBRATIO No results found for: IGGSERUM, IGA, IGMSERUM No results found for: Gail Levine, SPEI   Chemistry      Component Value Date/Time   NA 139 06/10/2016 0753   NA 137 01/15/2016 0806   K 3.8 06/10/2016 0753   K 4.2 01/15/2016 0806   CL 104 06/10/2016 0753   CL 101 05/28/2015 1435   CO2 29 06/10/2016 0753   CO2 27 01/15/2016 0806   BUN 9 06/10/2016 0753   BUN 18.1 01/15/2016 0806   CREATININE 0.64 06/10/2016 0753   CREATININE 0.8 01/15/2016 0806      Component Value Date/Time   CALCIUM 8.9 06/10/2016 0753   CALCIUM 8.6 01/15/2016 0806   ALKPHOS 45 06/10/2016 0753   ALKPHOS 62 01/15/2016 0806   AST 18 06/10/2016 0753   AST 19 01/15/2016 0806   ALT 12 06/10/2016 0753   ALT 11 01/15/2016 0806   BILITOT 0.8 06/10/2016 0753   BILITOT 0.36 01/15/2016 0806     Impression and Plan: Gail Levine is a very pleasant 51 yo Asian female with history of stage IIIC (T2N3) grade 3 IDC of the left breast diagnosed in 2007. She was triple negative and also BRCA negative. She had a left mastectomy in  August 2007 followed by adjuvant chemotherapy with Adriamycin, Cytoxan and Taxol (September 2007 - January 2008). She also received radiation to the chest ( February 2008 - March 2008). So far, she has done well and there has been no evidence of recurrence.   I do not Alles any evidence of disease recurrence. Given that she hasTriple negative disease, it is now been 11 years. I just do not think that this will come back.  We'll plan to get her back in one year now. She will like to come back in one year. I think this would be reasonable.   Larhonda Dettloff R,  MD 6/1/20188:43 AM

## 2016-07-09 ENCOUNTER — Other Ambulatory Visit: Payer: Self-pay | Admitting: Family Medicine

## 2016-07-09 DIAGNOSIS — C50212 Malignant neoplasm of upper-inner quadrant of left female breast: Secondary | ICD-10-CM

## 2016-07-09 DIAGNOSIS — E559 Vitamin D deficiency, unspecified: Secondary | ICD-10-CM

## 2016-07-11 ENCOUNTER — Other Ambulatory Visit: Payer: Self-pay | Admitting: Family Medicine

## 2016-07-29 ENCOUNTER — Other Ambulatory Visit (INDEPENDENT_AMBULATORY_CARE_PROVIDER_SITE_OTHER): Payer: BLUE CROSS/BLUE SHIELD

## 2016-07-29 DIAGNOSIS — I10 Essential (primary) hypertension: Secondary | ICD-10-CM

## 2016-07-29 DIAGNOSIS — E790 Hyperuricemia without signs of inflammatory arthritis and tophaceous disease: Secondary | ICD-10-CM | POA: Diagnosis not present

## 2016-07-29 DIAGNOSIS — E079 Disorder of thyroid, unspecified: Secondary | ICD-10-CM | POA: Diagnosis not present

## 2016-07-29 DIAGNOSIS — E785 Hyperlipidemia, unspecified: Secondary | ICD-10-CM | POA: Diagnosis not present

## 2016-07-29 DIAGNOSIS — E559 Vitamin D deficiency, unspecified: Secondary | ICD-10-CM

## 2016-07-29 DIAGNOSIS — M109 Gout, unspecified: Secondary | ICD-10-CM

## 2016-07-29 LAB — CBC
HCT: 39.4 % (ref 36.0–46.0)
Hemoglobin: 13.3 g/dL (ref 12.0–15.0)
MCHC: 33.7 g/dL (ref 30.0–36.0)
MCV: 94.3 fl (ref 78.0–100.0)
PLATELETS: 207 10*3/uL (ref 150.0–400.0)
RBC: 4.18 Mil/uL (ref 3.87–5.11)
RDW: 12.4 % (ref 11.5–15.5)
WBC: 4 10*3/uL (ref 4.0–10.5)

## 2016-07-29 LAB — VITAMIN D 25 HYDROXY (VIT D DEFICIENCY, FRACTURES): VITD: 21.73 ng/mL — AB (ref 30.00–100.00)

## 2016-07-29 LAB — COMPREHENSIVE METABOLIC PANEL
ALBUMIN: 4.1 g/dL (ref 3.5–5.2)
ALT: 15 U/L (ref 0–35)
AST: 21 U/L (ref 0–37)
Alkaline Phosphatase: 54 U/L (ref 39–117)
BILIRUBIN TOTAL: 0.5 mg/dL (ref 0.2–1.2)
BUN: 11 mg/dL (ref 6–23)
CO2: 31 meq/L (ref 19–32)
CREATININE: 0.69 mg/dL (ref 0.40–1.20)
Calcium: 9.3 mg/dL (ref 8.4–10.5)
Chloride: 102 mEq/L (ref 96–112)
GFR: 96.25 mL/min (ref >60.00)
Glucose, Bld: 102 mg/dL — ABNORMAL HIGH (ref 70–99)
Potassium: 3.4 mEq/L — ABNORMAL LOW (ref 3.5–5.1)
SODIUM: 140 meq/L (ref 135–145)
Total Protein: 7.9 g/dL (ref 6.0–8.3)

## 2016-07-29 LAB — LIPID PANEL
CHOL/HDL RATIO: 3
Cholesterol: 199 mg/dL (ref 0–200)
HDL: 60.2 mg/dL (ref >39.00)
LDL CALC: 120 mg/dL — AB (ref 0–99)
NonHDL: 138.93
Triglycerides: 93 mg/dL (ref 0.0–149.0)
VLDL: 18.6 mg/dL (ref 0.0–40.0)

## 2016-07-29 LAB — URIC ACID: URIC ACID, SERUM: 6 mg/dL (ref 2.4–7.0)

## 2016-07-29 LAB — TSH: TSH: 3.97 u[IU]/mL (ref 0.35–4.50)

## 2016-07-29 MED ORDER — VITAMIN D (ERGOCALCIFEROL) 1.25 MG (50000 UNIT) PO CAPS: 50000.0000 [IU] | ORAL_CAPSULE | ORAL | 4 refills | Status: DC

## 2016-09-05 ENCOUNTER — Other Ambulatory Visit: Payer: Self-pay | Admitting: Family Medicine

## 2016-09-06 IMAGING — DX DG KNEE 3 VIEWS*L*
4 series · 4 of 4 positions shown · non-contrast
Comparison: None.

CLINICAL DATA: Pain following fall 1 day prior

EXAM:
LEFT KNEE - 4 VIEW

[knee ap]
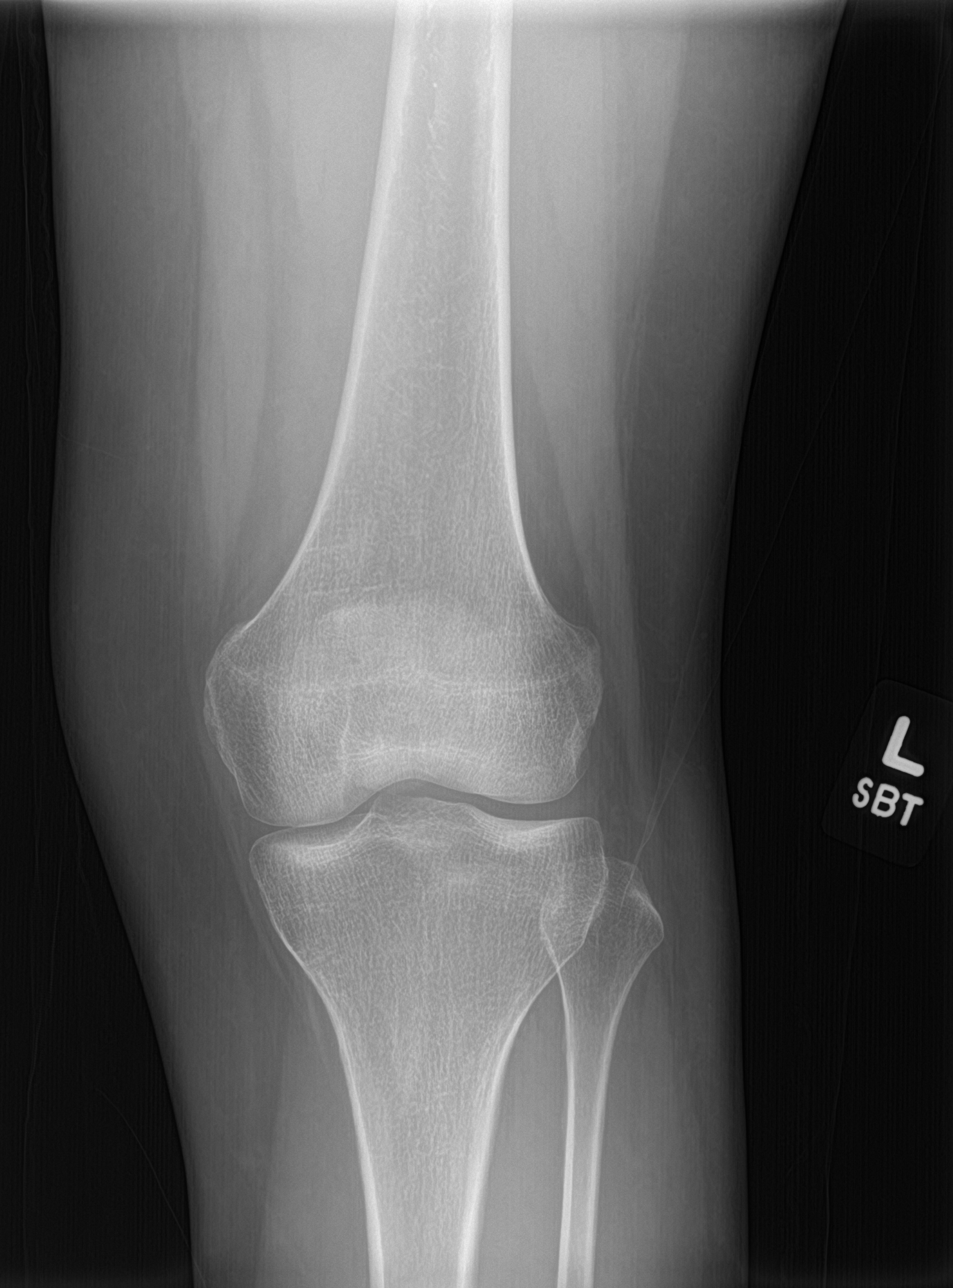

[tunnel]
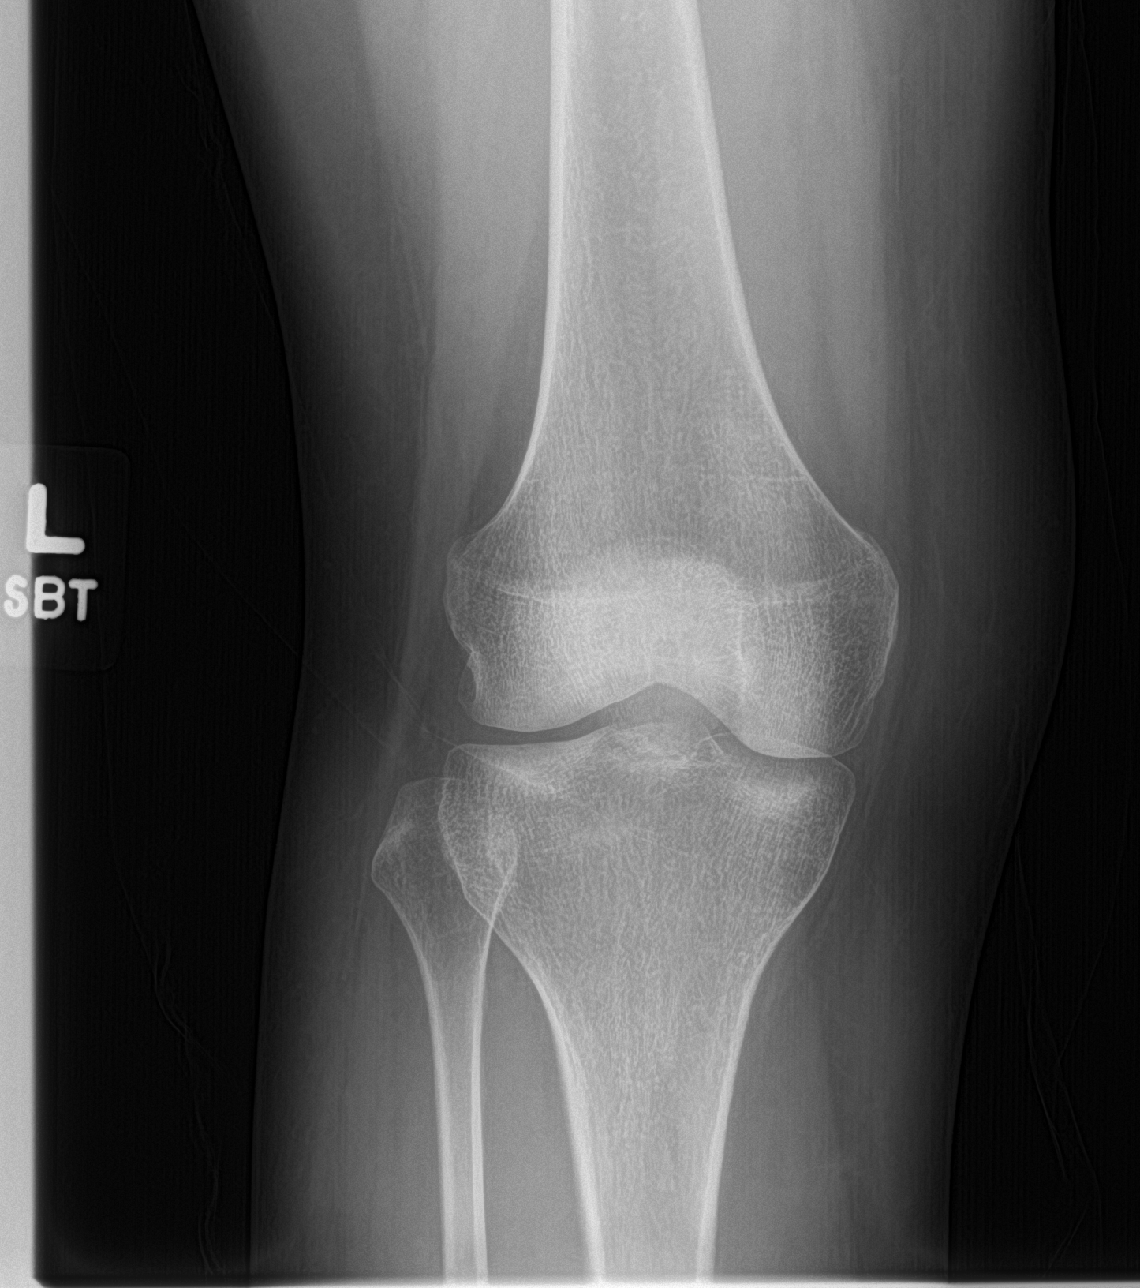

[knee lat]
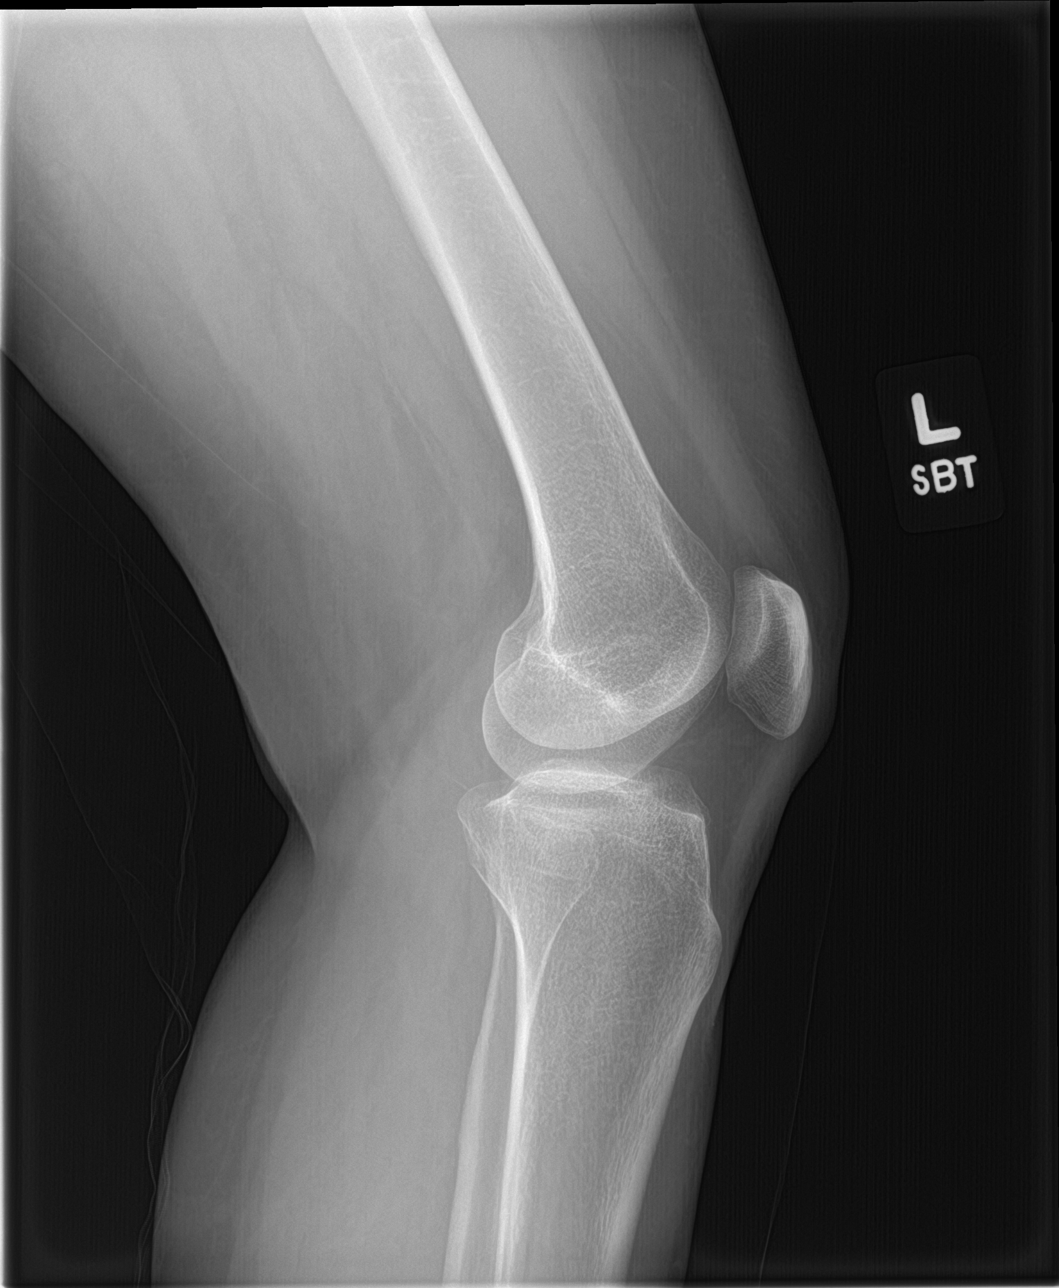

[knee sunrise]
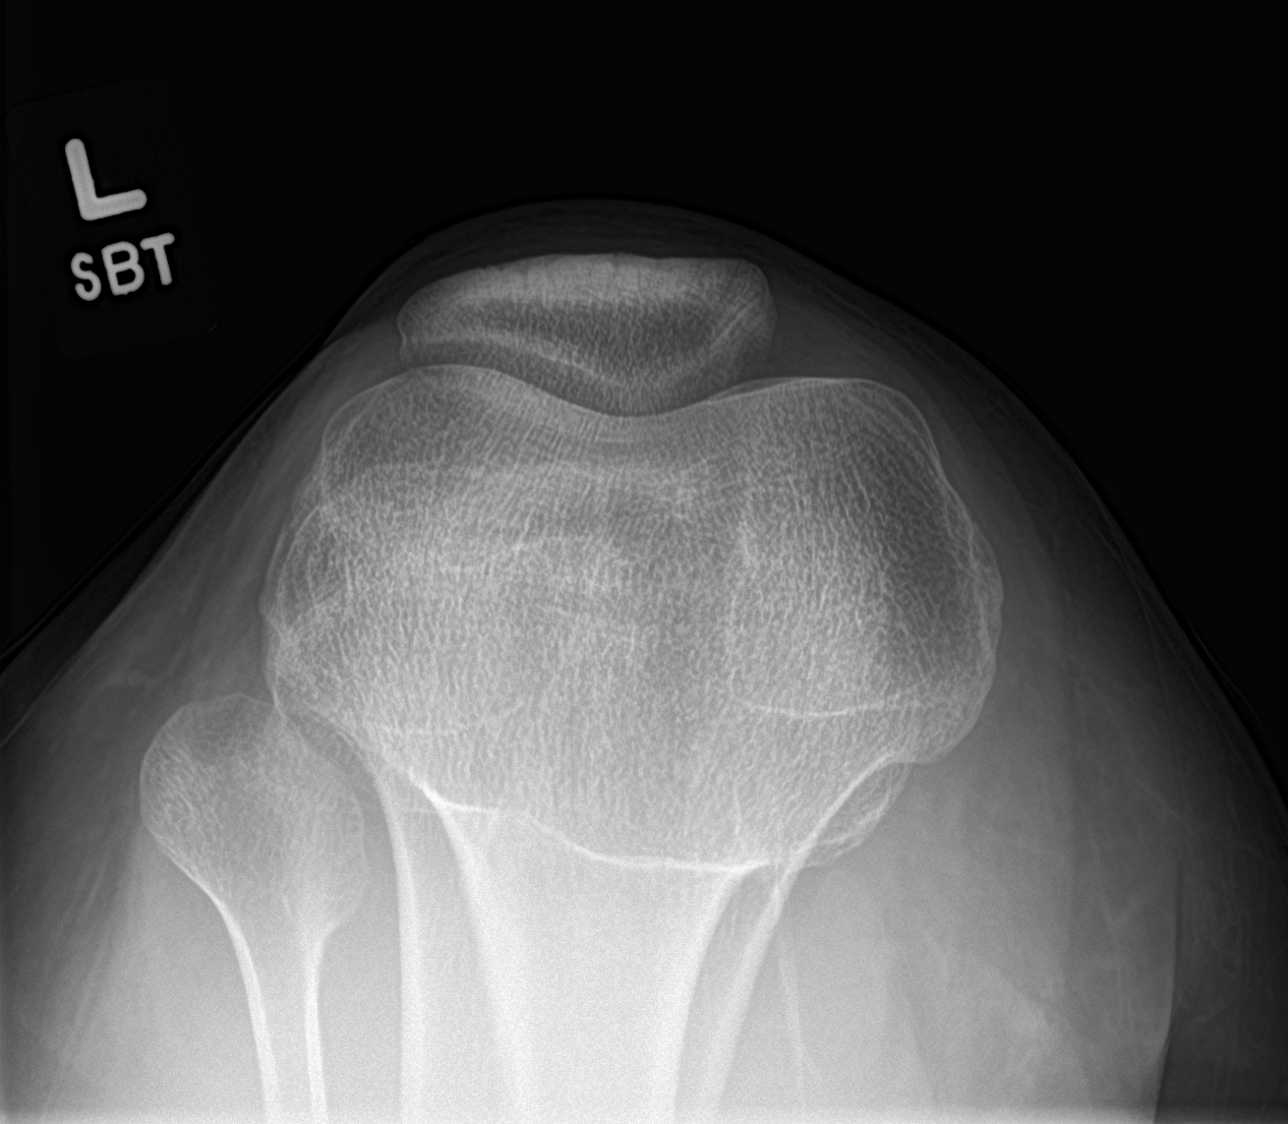

[4 of 4 positions shown; findings below may reference images not displayed]

FINDINGS: Frontal, tunnel, lateral, and sunrise patellar images were obtained.
There is no demonstrable fracture or dislocation. No joint effusion.
The joint spaces appear normal. No erosive change.
IMPRESSION: No fracture or joint effusion.  No appreciable arthropathy.

## 2016-09-09 ENCOUNTER — Other Ambulatory Visit (INDEPENDENT_AMBULATORY_CARE_PROVIDER_SITE_OTHER): Payer: BLUE CROSS/BLUE SHIELD

## 2016-09-09 DIAGNOSIS — E559 Vitamin D deficiency, unspecified: Secondary | ICD-10-CM | POA: Diagnosis not present

## 2016-09-09 LAB — URIC ACID: Uric Acid, Serum: 6.2 mg/dL (ref 2.4–7.0)

## 2016-09-09 LAB — VITAMIN D 25 HYDROXY (VIT D DEFICIENCY, FRACTURES): VITD: 19.7 ng/mL — AB (ref 30.00–100.00)

## 2016-09-13 ENCOUNTER — Other Ambulatory Visit: Payer: Self-pay

## 2016-09-13 MED ORDER — VITAMIN D (ERGOCALCIFEROL) 1.25 MG (50000 UNIT) PO CAPS: 50000.0000 [IU] | ORAL_CAPSULE | ORAL | 4 refills | Status: DC

## 2016-09-16 ENCOUNTER — Ambulatory Visit: Payer: BLUE CROSS/BLUE SHIELD | Admitting: Family Medicine

## 2016-09-23 DIAGNOSIS — R0602 Shortness of breath: Secondary | ICD-10-CM | POA: Diagnosis not present

## 2016-09-23 DIAGNOSIS — I10 Essential (primary) hypertension: Secondary | ICD-10-CM | POA: Diagnosis not present

## 2016-09-23 DIAGNOSIS — R002 Palpitations: Secondary | ICD-10-CM | POA: Diagnosis not present

## 2016-09-23 DIAGNOSIS — R079 Chest pain, unspecified: Secondary | ICD-10-CM | POA: Diagnosis not present

## 2016-09-27 ENCOUNTER — Encounter: Payer: Self-pay | Admitting: Family Medicine

## 2016-09-27 DIAGNOSIS — Z1231 Encounter for screening mammogram for malignant neoplasm of breast: Secondary | ICD-10-CM

## 2016-10-28 ENCOUNTER — Encounter: Payer: Self-pay | Admitting: Family Medicine

## 2016-10-28 ENCOUNTER — Ambulatory Visit (INDEPENDENT_AMBULATORY_CARE_PROVIDER_SITE_OTHER): Payer: BLUE CROSS/BLUE SHIELD | Admitting: Family Medicine

## 2016-10-28 VITALS — BP 108/60 | HR 63 | Temp 98.3°F | Ht 60.0 in | Wt 128.0 lb

## 2016-10-28 DIAGNOSIS — M109 Gout, unspecified: Secondary | ICD-10-CM

## 2016-10-28 DIAGNOSIS — I10 Essential (primary) hypertension: Secondary | ICD-10-CM

## 2016-10-28 DIAGNOSIS — Z23 Encounter for immunization: Secondary | ICD-10-CM

## 2016-10-28 DIAGNOSIS — Z1231 Encounter for screening mammogram for malignant neoplasm of breast: Secondary | ICD-10-CM | POA: Diagnosis not present

## 2016-10-28 DIAGNOSIS — R002 Palpitations: Secondary | ICD-10-CM | POA: Diagnosis not present

## 2016-10-28 DIAGNOSIS — E559 Vitamin D deficiency, unspecified: Secondary | ICD-10-CM | POA: Diagnosis not present

## 2016-10-28 DIAGNOSIS — E785 Hyperlipidemia, unspecified: Secondary | ICD-10-CM | POA: Diagnosis not present

## 2016-10-28 DIAGNOSIS — J45909 Unspecified asthma, uncomplicated: Secondary | ICD-10-CM

## 2016-10-28 DIAGNOSIS — Z1239 Encounter for other screening for malignant neoplasm of breast: Secondary | ICD-10-CM

## 2016-10-28 MED ORDER — LEVALBUTEROL TARTRATE 45 MCG/ACT IN AERO: 1.0000 | INHALATION_SPRAY | Freq: Four times a day (QID) | RESPIRATORY_TRACT | 5 refills | Status: DC | PRN

## 2016-10-28 NOTE — Assessment & Plan Note (Signed)
Is going to drop Allopurinol to qod and reassess

## 2016-10-28 NOTE — Progress Notes (Signed)
Subjective:  I acted as a Education administrator for Dr. Rogue Jury, CMA   Patient ID: Gail Levine, female    DOB: [birth year], 51 y.o.   MRN: [id number]  Chief Complaint  Patient presents with  . Follow-up    Pt comes in today for 3 month follow up and to have lab work performed. No chief complaints at this time.  . Labs Only    HPI  Patient is in today for her 3 month follow up. She is feeling well today. No recent febrile illness or hospitalizations. Her palpitations are improved and she is following with cardiology. Her breathing is better but she needs a refill on Xopenex. Denies CP/SOB/HA/congestion/fevers/GI or GU c/o. Taking meds as prescribed  Patient Care Team: Mosie Lukes, MD as PCP - General (Family Medicine)   Past Medical History:  Diagnosis Date  . Acute bronchitis 09/09/2015  . Allergy   . Arthritis   . Asthma   . Asthma 09/09/2015  . Back pain 01/08/2016  . Breast cancer, left breast ([place])    2007   . Cancer ([place])    2007  . Cervical cancer screening 01/01/2015  . Constipation 11/23/2015  . Dermatitis 01/04/2015  . Estrogen deficient vulvovaginitis 09/14/2014  . Gout   . Gout 06/03/2016  . HIV infection ([place])   . HTN (hypertension), benign 11/23/2015  . Hyperlipidemia   . Positive PPD   . Pruritus 09/04/2014  . Thyroid disease   . Vitamin D deficiency     Past Surgical History:  Procedure Laterality Date  . MASTECTOMY     left breast    Family History  Problem Relation Age of Onset  . Cancer Mother        cancer  . Hypertension Father   . Stroke Father   . Heart disease Father   . Diabetes Father     Social History   Social History  . Marital status: Single    Spouse name: N/A  . Number of children: N/A  . Years of education: N/A   Occupational History  . patient care coordinator    Social History Main Topics  . Smoking status: Never Smoker  . Smokeless tobacco: Never Used  . Alcohol use 0.0 oz/week  . Drug use: No  . Sexual activity: No   Other  Topics Concern  . Not on file   Social History Narrative  . No narrative on file    Outpatient Medications Prior to Visit  Medication Sig Dispense Refill  . allopurinol (ZYLOPRIM) 100 MG tablet TAKE 1 TABLET (100 MG TOTAL) BY MOUTH DAILY. 30 tablet 1  . Ascorbic Acid (VITAMIN C) 1000 MG tablet Take 1,000 mg by mouth daily.    Marland Kitchen aspirin EC 81 MG tablet Take 1 tablet (81 mg total) by mouth daily.    Marland Kitchen b complex vitamins tablet Take 1 tablet by mouth daily.    . Beclomethasone Diprop HFA (QVAR REDIHALER) 40 MCG/ACT AERB Inhale 1 puff into the lungs daily. 1 Inhaler 3  . calcium carbonate (OS-CAL) 600 MG TABS tablet Take 1,800 mg by mouth 2 (two) times daily with a meal.    . cetirizine (ZYRTEC) 10 MG tablet Take 10 mg by mouth daily.    Marland Kitchen diltiazem (CARDIZEM) 30 MG tablet Take 1 tablet (30 mg total) by mouth 3 (three) times daily.    . diphenhydrAMINE (BENADRYL) 25 MG tablet Take 25 mg by mouth every 6 (six) hours as needed for itching.     Marland Kitchen  hydrochlorothiazide (HYDRODIURIL) 25 MG tablet 1/2 tablet by mouth every day    . HYDROcodone-acetaminophen (NORCO) 5-325 MG tablet Take 1 tablet by mouth every 6 (six) hours as needed for moderate pain. 8 tablet 0  . LORazepam (ATIVAN) 0.5 MG tablet Take 1 tablet (0.5 mg total) by mouth 2 (two) times daily as needed for anxiety. 10 tablet 1  . methocarbamol (ROBAXIN) 500 MG tablet Take 1 tablet (500 mg total) by mouth every 8 (eight) hours as needed for muscle spasms. 30 tablet 1  . Multiple Vitamin (MULTIVITAMIN) tablet Take 1 tablet by mouth daily.    . ondansetron (ZOFRAN) 4 MG tablet Take 1 tablet (4 mg total) by mouth every 8 (eight) hours as needed for nausea or vomiting. 30 tablet 1  . Vitamin D, Ergocalciferol, (DRISDOL) 50000 units CAPS capsule Take 1 capsule (50,000 Units total) by mouth every 7 (seven) days. 4 capsule 4  . levalbuterol (XOPENEX HFA) 45 MCG/ACT inhaler Inhale 1 puff into the lungs every 6 (six) hours as needed for wheezing or  shortness of breath. Fill as daw. They do have permission to fill either but go ahead and rewrite. 1 Inhaler 5   No facility-administered medications prior to visit.     Allergies  Allergen Reactions  . Sulfa Antibiotics Anaphylaxis  . Penicillins Rash    Review of Systems  Constitutional: Negative for fever and malaise/fatigue.  HENT: Negative for congestion.   Eyes: Negative for blurred vision.  Respiratory: Negative for shortness of breath.   Cardiovascular: Negative for chest pain, palpitations and leg swelling.  Gastrointestinal: Negative for abdominal pain, blood in stool and nausea.  Genitourinary: Negative for dysuria and frequency.  Musculoskeletal: Negative for falls.  Skin: Negative for rash.  Neurological: Negative for dizziness, loss of consciousness and headaches.  Endo/Heme/Allergies: Negative for environmental allergies.  Psychiatric/Behavioral: Negative for depression. The patient is not nervous/anxious.        Objective:    Physical Exam  Constitutional: She is oriented to person, place, and time. She appears well-developed and well-nourished. No distress.  HENT:  Head: Normocephalic and atraumatic.  Nose: Nose normal.  Eyes: Right eye exhibits no discharge. Left eye exhibits no discharge.  Neck: Normal range of motion. Neck supple.  Cardiovascular: Normal rate and regular rhythm.   No murmur heard. Pulmonary/Chest: Effort normal and breath sounds normal.  Abdominal: Soft. Bowel sounds are normal. There is no tenderness.  Musculoskeletal: She exhibits no edema.  Neurological: She is alert and oriented to person, place, and time.  Skin: Skin is warm and dry.  Psychiatric: She has a normal mood and affect.  Nursing note and vitals reviewed.   BP 108/60   Pulse 63   Temp 98.3 F (36.8 C) (Oral)   Ht 5' (1.524 m)   Wt 128 lb (58.1 kg)   SpO2 99%   BMI 25.00 kg/m  Wt Readings from Last 3 Encounters:  10/28/16 128 lb (58.1 kg)  07/01/16 123 lb  (55.8 kg)  06/03/16 124 lb 3.2 oz (56.3 kg)   BP Readings from Last 3 Encounters:  10/28/16 108/60  07/01/16 116/66  06/03/16 116/70     Immunization History  Administered Date(s) Administered  . Influenza, Seasonal, Injecte, Preservative Fre 12/26/2006  . Tdap 10/28/2016    Health Maintenance  Topic Date Due  . HIV Screening  12/03/1982  . MAMMOGRAM  10/14/2016  . INFLUENZA VACCINE  12/19/2016 (Originally 08/31/2016)  . PAP SMEAR  12/31/2017  . TETANUS/TDAP  10/29/2026  Lab Results  Component Value Date   WBC 4.0 07/29/2016   HGB 13.3 07/29/2016   HCT 39.4 07/29/2016   PLT 207.0 07/29/2016   GLUCOSE 102 (H) 07/29/2016   CHOL 199 07/29/2016   TRIG 93.0 07/29/2016   HDL 60.20 07/29/2016   LDLCALC 120 (H) 07/29/2016   ALT 15 07/29/2016   AST 21 07/29/2016   NA 140 07/29/2016   K 3.4 (L) 07/29/2016   CL 102 07/29/2016   CREATININE 0.69 07/29/2016   BUN 11 07/29/2016   CO2 31 07/29/2016   TSH 3.97 07/29/2016    Lab Results  Component Value Date   TSH 3.97 07/29/2016   Lab Results  Component Value Date   WBC 4.0 07/29/2016   HGB 13.3 07/29/2016   HCT 39.4 07/29/2016   MCV 94.3 07/29/2016   PLT 207.0 07/29/2016   Lab Results  Component Value Date   NA 140 07/29/2016   K 3.4 (L) 07/29/2016   CHLORIDE 104 07/01/2016   CO2 31 07/29/2016   GLUCOSE 102 (H) 07/29/2016   BUN 11 07/29/2016   CREATININE 0.69 07/29/2016   BILITOT 0.5 07/29/2016   ALKPHOS 54 07/29/2016   AST 21 07/29/2016   ALT 15 07/29/2016   PROT 7.9 07/29/2016   ALBUMIN 4.1 07/29/2016   CALCIUM 9.3 07/29/2016   ANIONGAP 7 07/01/2016   EGFR 90 (L) 07/01/2016   GFR 96.25 07/29/2016   Lab Results  Component Value Date   CHOL 199 07/29/2016   Lab Results  Component Value Date   HDL 60.20 07/29/2016   Lab Results  Component Value Date   LDLCALC 120 (H) 07/29/2016   Lab Results  Component Value Date   TRIG 93.0 07/29/2016   Lab Results  Component Value Date   CHOLHDL 3  07/29/2016   No results found for: HGBA1C       Assessment & Plan:   Problem List Items Addressed This Visit    Vitamin D deficiency    Stay on daily supplements      Relevant Orders   VITAMIN D 25 Hydroxy (Vit-D Deficiency, Fractures)   Hyperlipidemia    Encouraged heart healthy diet, increase exercise, avoid trans fats, consider a krill oil cap daily      Relevant Orders   Lipid panel   Palpitations    Improved and following with cardiology      Asthma    Doing well given refill on Xopenex      Relevant Medications   levalbuterol (XOPENEX HFA) 45 MCG/ACT inhaler   HTN (hypertension), benign    Well controlled, no changes to meds. Encouraged heart healthy diet such as the DASH diet and exercise as tolerated.       Relevant Orders   CBC   Comprehensive metabolic panel   TSH   Gout    Is going to drop Allopurinol to qod and reassess      Relevant Orders   Uric acid    Other Visit Diagnoses    Breast cancer screening    -  Primary   Relevant Orders   MM SCREENING BREAST TOMO UNI R      I am having Ms. Squyres maintain her multivitamin, vitamin C, calcium carbonate, cetirizine, b complex vitamins, diphenhydrAMINE, LORazepam, methocarbamol, HYDROcodone-acetaminophen, ondansetron, diltiazem, aspirin EC, hydrochlorothiazide, beclomethasone, allopurinol, Vitamin D (Ergocalciferol), and levalbuterol.  Meds ordered this encounter  Medications  . levalbuterol (XOPENEX HFA) 45 MCG/ACT inhaler    Sig: Inhale 1 puff into the lungs every  6 (six) hours as needed for wheezing or shortness of breath. Fill as daw. They do have permission to fill either but go ahead and rewrite.    Dispense:  1 Inhaler    Refill:  5    CMA served as scribe during this visit. History, Physical and Plan performed by medical provider. Documentation and orders reviewed and attested to.  Penni Homans, MD

## 2016-10-28 NOTE — Assessment & Plan Note (Signed)
Stay on daily supplements

## 2016-10-28 NOTE — Assessment & Plan Note (Signed)
Encouraged heart healthy diet, increase exercise, avoid trans fats, consider a krill oil cap daily 

## 2016-10-28 NOTE — Assessment & Plan Note (Signed)
Well controlled, no changes to meds. Encouraged heart healthy diet such as the DASH diet and exercise as tolerated.  °

## 2016-10-28 NOTE — Patient Instructions (Signed)

## 2016-10-30 NOTE — Assessment & Plan Note (Signed)
Doing well given refill on Xopenex

## 2016-10-30 NOTE — Assessment & Plan Note (Signed)
Improved and following with cardiology

## 2016-11-05 ENCOUNTER — Other Ambulatory Visit: Payer: Self-pay | Admitting: Family Medicine

## 2016-11-08 ENCOUNTER — Ambulatory Visit (HOSPITAL_BASED_OUTPATIENT_CLINIC_OR_DEPARTMENT_OTHER)
Admission: RE | Admit: 2016-11-08 | Discharge: 2016-11-08 | Disposition: A | Discharge status: Home / Self Care | Payer: BLUE CROSS/BLUE SHIELD | Source: Ambulatory Visit | Attending: Family Medicine | Admitting: Family Medicine

## 2016-11-08 ENCOUNTER — Encounter (HOSPITAL_BASED_OUTPATIENT_CLINIC_OR_DEPARTMENT_OTHER): Payer: Self-pay

## 2016-11-08 DIAGNOSIS — Z1231 Encounter for screening mammogram for malignant neoplasm of breast: Secondary | ICD-10-CM | POA: Insufficient documentation

## 2016-11-08 DIAGNOSIS — R921 Mammographic calcification found on diagnostic imaging of breast: Secondary | ICD-10-CM | POA: Insufficient documentation

## 2016-11-08 DIAGNOSIS — Z1239 Encounter for other screening for malignant neoplasm of breast: Secondary | ICD-10-CM

## 2016-11-11 ENCOUNTER — Other Ambulatory Visit: Payer: Self-pay | Admitting: Family Medicine

## 2016-11-11 DIAGNOSIS — R928 Other abnormal and inconclusive findings on diagnostic imaging of breast: Secondary | ICD-10-CM

## 2016-11-15 ENCOUNTER — Encounter: Payer: Self-pay | Admitting: Family Medicine

## 2016-12-16 ENCOUNTER — Ambulatory Visit
Admission: RE | Admit: 2016-12-16 | Discharge: 2016-12-16 | Disposition: A | Discharge status: Home / Self Care | Payer: BLUE CROSS/BLUE SHIELD | Source: Ambulatory Visit | Attending: Family Medicine | Admitting: Family Medicine

## 2016-12-16 DIAGNOSIS — R921 Mammographic calcification found on diagnostic imaging of breast: Secondary | ICD-10-CM | POA: Diagnosis not present

## 2016-12-16 DIAGNOSIS — R928 Other abnormal and inconclusive findings on diagnostic imaging of breast: Secondary | ICD-10-CM

## 2017-01-13 ENCOUNTER — Other Ambulatory Visit (INDEPENDENT_AMBULATORY_CARE_PROVIDER_SITE_OTHER): Payer: BLUE CROSS/BLUE SHIELD

## 2017-01-13 DIAGNOSIS — E785 Hyperlipidemia, unspecified: Secondary | ICD-10-CM | POA: Diagnosis not present

## 2017-01-13 DIAGNOSIS — I10 Essential (primary) hypertension: Secondary | ICD-10-CM | POA: Diagnosis not present

## 2017-01-13 DIAGNOSIS — E559 Vitamin D deficiency, unspecified: Secondary | ICD-10-CM | POA: Diagnosis not present

## 2017-01-13 DIAGNOSIS — M109 Gout, unspecified: Secondary | ICD-10-CM

## 2017-01-13 LAB — COMPREHENSIVE METABOLIC PANEL
ALBUMIN: 4 g/dL (ref 3.5–5.2)
ALK PHOS: 51 U/L (ref 39–117)
ALT: 36 U/L — ABNORMAL HIGH (ref 0–35)
AST: 38 U/L — AB (ref 0–37)
BILIRUBIN TOTAL: 0.6 mg/dL (ref 0.2–1.2)
BUN: 9 mg/dL (ref 6–23)
CALCIUM: 9.1 mg/dL (ref 8.4–10.5)
CO2: 34 mEq/L — ABNORMAL HIGH (ref 19–32)
Chloride: 104 mEq/L (ref 96–112)
Creatinine, Ser: 0.68 mg/dL (ref 0.40–1.20)
GFR: 97.7 mL/min (ref >60.00)
Glucose, Bld: 78 mg/dL (ref 70–99)
Potassium: 4 mEq/L (ref 3.5–5.1)
Sodium: 142 mEq/L (ref 135–145)
TOTAL PROTEIN: 7.9 g/dL (ref 6.0–8.3)

## 2017-01-13 LAB — VITAMIN D 25 HYDROXY (VIT D DEFICIENCY, FRACTURES): VITD: 27.38 ng/mL — AB (ref 30.00–100.00)

## 2017-01-13 LAB — CBC
HCT: 41.3 % (ref 36.0–46.0)
Hemoglobin: 13.6 g/dL (ref 12.0–15.0)
MCHC: 33 g/dL (ref 30.0–36.0)
MCV: 94.2 fl (ref 78.0–100.0)
PLATELETS: 219 10*3/uL (ref 150.0–400.0)
RBC: 4.39 Mil/uL (ref 3.87–5.11)
RDW: 12.8 % (ref 11.5–15.5)
WBC: 3.7 10*3/uL — AB (ref 4.0–10.5)

## 2017-01-13 LAB — LIPID PANEL
CHOLESTEROL: 177 mg/dL (ref 0–200)
HDL: 61.3 mg/dL (ref >39.00)
LDL Cholesterol: 103 mg/dL — ABNORMAL HIGH (ref 0–99)
NonHDL: 115.3
TRIGLYCERIDES: 64 mg/dL (ref 0.0–149.0)
Total CHOL/HDL Ratio: 3
VLDL: 12.8 mg/dL (ref 0.0–40.0)

## 2017-01-13 LAB — URIC ACID: URIC ACID, SERUM: 5.7 mg/dL (ref 2.4–7.0)

## 2017-01-13 LAB — TSH: TSH: 4.05 u[IU]/mL (ref 0.35–4.50)

## 2017-01-20 ENCOUNTER — Ambulatory Visit (INDEPENDENT_AMBULATORY_CARE_PROVIDER_SITE_OTHER): Payer: BLUE CROSS/BLUE SHIELD | Admitting: Family Medicine

## 2017-01-20 ENCOUNTER — Encounter: Payer: Self-pay | Admitting: Family Medicine

## 2017-01-20 DIAGNOSIS — I10 Essential (primary) hypertension: Secondary | ICD-10-CM | POA: Diagnosis not present

## 2017-01-20 DIAGNOSIS — Z124 Encounter for screening for malignant neoplasm of cervix: Secondary | ICD-10-CM

## 2017-01-20 DIAGNOSIS — Z0000 Encounter for general adult medical examination without abnormal findings: Secondary | ICD-10-CM

## 2017-01-20 DIAGNOSIS — E079 Disorder of thyroid, unspecified: Secondary | ICD-10-CM | POA: Diagnosis not present

## 2017-01-20 DIAGNOSIS — E559 Vitamin D deficiency, unspecified: Secondary | ICD-10-CM | POA: Diagnosis not present

## 2017-01-20 DIAGNOSIS — Z1239 Encounter for other screening for malignant neoplasm of breast: Secondary | ICD-10-CM | POA: Insufficient documentation

## 2017-01-20 DIAGNOSIS — E782 Mixed hyperlipidemia: Secondary | ICD-10-CM

## 2017-01-20 HISTORY — DX: Encounter for general adult medical examination without abnormal findings: Z00.00

## 2017-01-20 NOTE — Assessment & Plan Note (Signed)
Encouraged heart healthy diet, increase exercise, avoid trans fats, consider a krill oil cap daily 

## 2017-01-20 NOTE — Progress Notes (Signed)
Subjective:  I acted as a Education administrator for Dr. Charlett Blake. Gail Levine, [place]  Patient ID: Gail Levine, female    DOB: Mar 01, 1965, 51 y.o.   MRN: [id number]  No chief complaint on file.   HPI  Patient is in today for an annual exam and follow up on chronic medical concerns including vitamin D deficiency, Hypertension, hyperlipidemia and more. She feels well today. No recent febrile illness or recent hospitalizations. Is doing well with ADLs. Is maintaining a heart healthy diet and stays active. Denies CP/palp/SOB/HA/congestion/fevers/GI or GU c/o. Taking meds as prescribed  Patient Care Team: Mosie Lukes, MD as PCP - General (Family Medicine)   Past Medical History:  Diagnosis Date  . Acute bronchitis 09/09/2015  . Allergy   . Arthritis   . Asthma   . Asthma 09/09/2015  . Back pain 01/08/2016  . Breast cancer, left breast ([place])    2007   . Cancer ([place])    2007  . Cervical cancer screening 01/01/2015  . Constipation 11/23/2015  . Dermatitis 01/04/2015  . Estrogen deficient vulvovaginitis 09/14/2014  . Gout   . Gout 06/03/2016  . HIV infection ([place])   . HTN (hypertension), benign 11/23/2015  . Hyperlipidemia   . Positive PPD   . Preventative health care 01/20/2017  . Pruritus 09/04/2014  . Thyroid disease   . Vitamin D deficiency     Past Surgical History:  Procedure Laterality Date  . MASTECTOMY     left breast    Family History  Problem Relation Age of Onset  . Cancer Mother        cancer  . Hypertension Father   . Stroke Father   . Heart disease Father   . Diabetes Father     Social History   Socioeconomic History  . Marital status: Single    Spouse name: Not on file  . Number of children: Not on file  . Years of education: Not on file  . Highest education level: Not on file  Social Needs  . Financial resource strain: Not on file  . Food insecurity - worry: Not on file  . Food insecurity - inability: Not on file  . Transportation needs - medical: Not on file  .  Transportation needs - non-medical: Not on file  Occupational History  . Occupation: patient care coordinator  Tobacco Use  . Smoking status: Never Smoker  . Smokeless tobacco: Never Used  Substance and Sexual Activity  . Alcohol use: Yes    Alcohol/week: 0.0 oz  . Drug use: No  . Sexual activity: No  Other Topics Concern  . Not on file  Social History Narrative  . Not on file    Outpatient Medications Prior to Visit  Medication Sig Dispense Refill  . allopurinol (ZYLOPRIM) 100 MG tablet TAKE 1 TABLET (100 MG TOTAL) BY MOUTH DAILY. 30 tablet 2  . Ascorbic Acid (VITAMIN C) 1000 MG tablet Take 1,000 mg by mouth daily.    Marland Kitchen aspirin EC 81 MG tablet Take 1 tablet (81 mg total) by mouth daily.    Marland Kitchen b complex vitamins tablet Take 1 tablet by mouth daily.    . Beclomethasone Diprop HFA (QVAR REDIHALER) 40 MCG/ACT AERB Inhale 1 puff into the lungs daily. 1 Inhaler 3  . calcium carbonate (OS-CAL) 600 MG TABS tablet Take 1,800 mg by mouth 2 (two) times daily with a meal.    . cetirizine (ZYRTEC) 10 MG tablet Take 10 mg by mouth daily.    Marland Kitchen  diltiazem (CARDIZEM) 30 MG tablet Take 1 tablet (30 mg total) by mouth 3 (three) times daily.    . diphenhydrAMINE (BENADRYL) 25 MG tablet Take 25 mg by mouth every 6 (six) hours as needed for itching.     . hydrochlorothiazide (HYDRODIURIL) 25 MG tablet 1/2 tablet by mouth every day    . HYDROcodone-acetaminophen (NORCO) 5-325 MG tablet Take 1 tablet by mouth every 6 (six) hours as needed for moderate pain. 8 tablet 0  . levalbuterol (XOPENEX HFA) 45 MCG/ACT inhaler Inhale 1 puff into the lungs every 6 (six) hours as needed for wheezing or shortness of breath. Fill as daw. They do have permission to fill either but go ahead and rewrite. 1 Inhaler 5  . LORazepam (ATIVAN) 0.5 MG tablet Take 1 tablet (0.5 mg total) by mouth 2 (two) times daily as needed for anxiety. 10 tablet 1  . methocarbamol (ROBAXIN) 500 MG tablet Take 1 tablet (500 mg total) by mouth every  8 (eight) hours as needed for muscle spasms. 30 tablet 1  . Multiple Vitamin (MULTIVITAMIN) tablet Take 1 tablet by mouth daily.    . ondansetron (ZOFRAN) 4 MG tablet Take 1 tablet (4 mg total) by mouth every 8 (eight) hours as needed for nausea or vomiting. 30 tablet 1  . Vitamin D, Ergocalciferol, (DRISDOL) 50000 units CAPS capsule Take 1 capsule (50,000 Units total) by mouth every 7 (seven) days. 4 capsule 4   No facility-administered medications prior to visit.     Allergies  Allergen Reactions  . Sulfa Antibiotics Anaphylaxis  . Penicillins Rash    Review of Systems  Constitutional: Negative for fever and malaise/fatigue.  HENT: Negative for congestion.   Eyes: Negative for blurred vision.  Respiratory: Negative for cough and shortness of breath.   Cardiovascular: Negative for chest pain, palpitations and leg swelling.  Gastrointestinal: Negative for vomiting.  Musculoskeletal: Negative for back pain.  Skin: Negative for rash.  Neurological: Negative for loss of consciousness and headaches.       Objective:    Physical Exam  Constitutional: She is oriented to person, place, and time. She appears well-developed and well-nourished. No distress.  HENT:  Head: Normocephalic and atraumatic.  Eyes: Conjunctivae are normal.  Neck: Normal range of motion. No thyromegaly present.  Cardiovascular: Normal rate and regular rhythm.  Pulmonary/Chest: Effort normal and breath sounds normal. She has no wheezes.  Abdominal: Soft. Bowel sounds are normal. There is no tenderness.  Musculoskeletal: Normal range of motion. She exhibits no edema or deformity.  Neurological: She is alert and oriented to person, place, and time.  Skin: Skin is warm and dry. She is not diaphoretic.  Psychiatric: She has a normal mood and affect.    There were no vitals taken for this visit. Wt Readings from Last 3 Encounters:  10/28/16 128 lb (58.1 kg)  07/01/16 123 lb (55.8 kg)  06/03/16 124 lb 3.2 oz  (56.3 kg)   BP Readings from Last 3 Encounters:  10/28/16 108/60  07/01/16 116/66  06/03/16 116/70     Immunization History  Administered Date(s) Administered  . Influenza, Seasonal, Injecte, Preservative Fre 12/26/2006  . Tdap 10/28/2016    Health Maintenance  Topic Date Due  . HIV Screening  12/03/1982  . INFLUENZA VACCINE  09/22/2017 (Originally 08/31/2016)  . MAMMOGRAM  12/16/2017  . PAP SMEAR  12/31/2017  . TETANUS/TDAP  10/29/2026    Lab Results  Component Value Date   WBC 3.7 (L) 01/13/2017   HGB 13.6  01/13/2017   HCT 41.3 01/13/2017   PLT 219.0 01/13/2017   GLUCOSE 78 01/13/2017   CHOL 177 01/13/2017   TRIG 64.0 01/13/2017   HDL 61.30 01/13/2017   LDLCALC 103 (H) 01/13/2017   ALT 36 (H) 01/13/2017   AST 38 (H) 01/13/2017   NA 142 01/13/2017   K 4.0 01/13/2017   CL 104 01/13/2017   CREATININE 0.68 01/13/2017   BUN 9 01/13/2017   CO2 34 (H) 01/13/2017   TSH 4.05 01/13/2017    Lab Results  Component Value Date   TSH 4.05 01/13/2017   Lab Results  Component Value Date   WBC 3.7 (L) 01/13/2017   HGB 13.6 01/13/2017   HCT 41.3 01/13/2017   MCV 94.2 01/13/2017   PLT 219.0 01/13/2017   Lab Results  Component Value Date   NA 142 01/13/2017   K 4.0 01/13/2017   CHLORIDE 104 07/01/2016   CO2 34 (H) 01/13/2017   GLUCOSE 78 01/13/2017   BUN 9 01/13/2017   CREATININE 0.68 01/13/2017   BILITOT 0.6 01/13/2017   ALKPHOS 51 01/13/2017   AST 38 (H) 01/13/2017   ALT 36 (H) 01/13/2017   PROT 7.9 01/13/2017   ALBUMIN 4.0 01/13/2017   CALCIUM 9.1 01/13/2017   ANIONGAP 7 07/01/2016   EGFR 90 (L) 07/01/2016   GFR 97.70 01/13/2017   Lab Results  Component Value Date   CHOL 177 01/13/2017   Lab Results  Component Value Date   HDL 61.30 01/13/2017   Lab Results  Component Value Date   LDLCALC 103 (H) 01/13/2017   Lab Results  Component Value Date   TRIG 64.0 01/13/2017   Lab Results  Component Value Date   CHOLHDL 3 01/13/2017   No results  found for: HGBA1C       Assessment & Plan:   Problem List Items Addressed This Visit    Vitamin D deficiency    Continue supplements and monitor      Relevant Orders   VITAMIN D 25 Hydroxy (Vit-D Deficiency, Fractures)   Thyroid disease    TSH WNL      Hyperlipidemia    Encouraged heart healthy diet, increase exercise, avoid trans fats, consider a krill oil cap daily      Relevant Orders   Lipid panel   Cervical cancer screening    Pap due 2019      HTN (hypertension), benign    Well controlled, no changes to meds. Encouraged heart healthy diet such as the DASH diet and exercise as tolerated.       Relevant Orders   CBC   Comprehensive metabolic panel   TSH   Preventative health care    Patient encouraged to maintain heart healthy diet, regular exercise, adequate sleep. Consider daily probiotics. Take medications as prescribed. Labs reviewed with pateint. Given and reviewed copy of ACP documents from [employer] and encouraged to complete and return         I am having Adessa Fries maintain her multivitamin, vitamin C, calcium carbonate, cetirizine, b complex vitamins, diphenhydrAMINE, LORazepam, methocarbamol, HYDROcodone-acetaminophen, ondansetron, diltiazem, aspirin EC, hydrochlorothiazide, beclomethasone, Vitamin D (Ergocalciferol), levalbuterol, and allopurinol.  No orders of the defined types were placed in this encounter.   CMA served as Education administrator during this visit. History, Physical and Plan performed by medical provider. Documentation and orders reviewed and attested to.  Penni Homans, MD

## 2017-01-20 NOTE — Assessment & Plan Note (Signed)
Well controlled, no changes to meds. Encouraged heart healthy diet such as the DASH diet and exercise as tolerated.  °

## 2017-01-20 NOTE — Assessment & Plan Note (Signed)
Continue supplements and monitor

## 2017-01-20 NOTE — Assessment & Plan Note (Signed)
Patient encouraged to maintain heart healthy diet, regular exercise, adequate sleep. Consider daily probiotics. Take medications as prescribed. Labs reviewed with pateint. Given and reviewed copy of ACP documents from [employer] and encouraged to complete and return

## 2017-01-20 NOTE — Assessment & Plan Note (Signed)
TSH WNL

## 2017-01-20 NOTE — Patient Instructions (Signed)
Preventive Care 40-64 Years, Female Preventive care refers to lifestyle choices and visits with your health care provider that can promote health and wellness. What does preventive care include?  A yearly physical exam. This is also called an annual well check.  Dental exams once or twice a year.  Routine eye exams. Ask your health care provider how often you should have your eyes checked.  Personal lifestyle choices, including: ? Daily care of your teeth and gums. ? Regular physical activity. ? Eating a healthy diet. ? Avoiding tobacco and drug use. ? Limiting alcohol use. ? Practicing safe sex. ? Taking low-dose aspirin daily starting at age 58. ? Taking vitamin and mineral supplements as recommended by your health care provider. What happens during an annual well check? The services and screenings done by your health care provider during your annual well check will depend on your age, overall health, lifestyle risk factors, and family history of disease. Counseling Your health care provider may ask you questions about your:  Alcohol use.  Tobacco use.  Drug use.  Emotional well-being.  Home and relationship well-being.  Sexual activity.  Eating habits.  Work and work Statistician.  Method of birth control.  Menstrual cycle.  Pregnancy history.  Screening You may have the following tests or measurements:  Height, weight, and BMI.  Blood pressure.  Lipid and cholesterol levels. These may be checked every 5 years, or more frequently if you are over 81 years old.  Skin check.  Lung cancer screening. You may have this screening every year starting at age 78 if you have a 30-pack-year history of smoking and currently smoke or have quit within the past 15 years.  Fecal occult blood test (FOBT) of the stool. You may have this test every year starting at age 65.  Flexible sigmoidoscopy or colonoscopy. You may have a sigmoidoscopy every 5 years or a colonoscopy  every 10 years starting at age 30.  Hepatitis C blood test.  Hepatitis B blood test.  Sexually transmitted disease (STD) testing.  Diabetes screening. This is done by checking your blood sugar (glucose) after you have not eaten for a while (fasting). You may have this done every 1-3 years.  Mammogram. This may be done every 1-2 years. Talk to your health care provider about when you should start having regular mammograms. This may depend on whether you have a family history of breast cancer.  BRCA-related cancer screening. This may be done if you have a family history of breast, ovarian, tubal, or peritoneal cancers.  Pelvic exam and Pap test. This may be done every 3 years starting at age 80. Starting at age 36, this may be done every 5 years if you have a Pap test in combination with an HPV test.  Bone density scan. This is done to screen for osteoporosis. You may have this scan if you are at high risk for osteoporosis.  Discuss your test results, treatment options, and if necessary, the need for more tests with your health care provider. Vaccines Your health care provider may recommend certain vaccines, such as:  Influenza vaccine. This is recommended every year.  Tetanus, diphtheria, and acellular pertussis (Tdap, Td) vaccine. You may need a Td booster every 10 years.  Varicella vaccine. You may need this if you have not been vaccinated.  Zoster vaccine. You may need this after age 5.  Measles, mumps, and rubella (MMR) vaccine. You may need at least one dose of MMR if you were born in  1957 or later. You may also need a second dose.  Pneumococcal 13-valent conjugate (PCV13) vaccine. You may need this if you have certain conditions and were not previously vaccinated.  Pneumococcal polysaccharide (PPSV23) vaccine. You may need one or two doses if you smoke cigarettes or if you have certain conditions.  Meningococcal vaccine. You may need this if you have certain  conditions.  Hepatitis A vaccine. You may need this if you have certain conditions or if you travel or work in places where you may be exposed to hepatitis A.  Hepatitis B vaccine. You may need this if you have certain conditions or if you travel or work in places where you may be exposed to hepatitis B.  Haemophilus influenzae type b (Hib) vaccine. You may need this if you have certain conditions.  Talk to your health care provider about which screenings and vaccines you need and how often you need them. This information is not intended to replace advice given to you by your health care provider. Make sure you discuss any questions you have with your health care provider. Document Released: 02/13/2015 Document Revised: 10/07/2015 Document Reviewed: 11/18/2014 Elsevier Interactive Patient Education  2018 Elsevier Inc.  

## 2017-01-20 NOTE — Assessment & Plan Note (Signed)
Pap due 2019

## 2017-02-07 ENCOUNTER — Other Ambulatory Visit: Payer: Self-pay | Admitting: Family Medicine

## 2017-03-31 DIAGNOSIS — R002 Palpitations: Secondary | ICD-10-CM | POA: Diagnosis not present

## 2017-03-31 DIAGNOSIS — R0602 Shortness of breath: Secondary | ICD-10-CM | POA: Diagnosis not present

## 2017-03-31 DIAGNOSIS — R600 Localized edema: Secondary | ICD-10-CM | POA: Diagnosis not present

## 2017-03-31 DIAGNOSIS — I10 Essential (primary) hypertension: Secondary | ICD-10-CM | POA: Diagnosis not present

## 2017-04-21 ENCOUNTER — Other Ambulatory Visit: Payer: BLUE CROSS/BLUE SHIELD

## 2017-04-21 ENCOUNTER — Ambulatory Visit: Payer: BLUE CROSS/BLUE SHIELD | Admitting: Family Medicine

## 2017-04-25 ENCOUNTER — Other Ambulatory Visit (INDEPENDENT_AMBULATORY_CARE_PROVIDER_SITE_OTHER): Payer: BLUE CROSS/BLUE SHIELD

## 2017-04-25 DIAGNOSIS — E559 Vitamin D deficiency, unspecified: Secondary | ICD-10-CM

## 2017-04-25 DIAGNOSIS — I10 Essential (primary) hypertension: Secondary | ICD-10-CM

## 2017-04-25 DIAGNOSIS — E782 Mixed hyperlipidemia: Secondary | ICD-10-CM

## 2017-04-25 LAB — CBC
HEMATOCRIT: 39.9 % (ref 36.0–46.0)
Hemoglobin: 13.4 g/dL (ref 12.0–15.0)
MCHC: 33.5 g/dL (ref 30.0–36.0)
MCV: 92.8 fl (ref 78.0–100.0)
PLATELETS: 215 10*3/uL (ref 150.0–400.0)
RBC: 4.3 Mil/uL (ref 3.87–5.11)
RDW: 13.6 % (ref 11.5–15.5)
WBC: 3.9 10*3/uL — ABNORMAL LOW (ref 4.0–10.5)

## 2017-04-25 LAB — COMPREHENSIVE METABOLIC PANEL
ALK PHOS: 61 U/L (ref 39–117)
ALT: 15 U/L (ref 0–35)
AST: 21 U/L (ref 0–37)
Albumin: 3.8 g/dL (ref 3.5–5.2)
BUN: 8 mg/dL (ref 6–23)
CO2: 28 mEq/L (ref 19–32)
Calcium: 8.9 mg/dL (ref 8.4–10.5)
Chloride: 102 mEq/L (ref 96–112)
Creatinine, Ser: 0.63 mg/dL (ref 0.40–1.20)
GFR: 106.58 mL/min (ref >60.00)
Glucose, Bld: 111 mg/dL — ABNORMAL HIGH (ref 70–99)
POTASSIUM: 3.6 meq/L (ref 3.5–5.1)
Sodium: 138 mEq/L (ref 135–145)
TOTAL PROTEIN: 7.6 g/dL (ref 6.0–8.3)
Total Bilirubin: 0.7 mg/dL (ref 0.2–1.2)

## 2017-04-25 LAB — TSH: TSH: 4.04 u[IU]/mL (ref 0.35–4.50)

## 2017-04-25 LAB — LIPID PANEL
Cholesterol: 182 mg/dL (ref 0–200)
HDL: 63.9 mg/dL (ref >39.00)
LDL Cholesterol: 108 mg/dL — ABNORMAL HIGH (ref 0–99)
NONHDL: 118.51
Total CHOL/HDL Ratio: 3
Triglycerides: 55 mg/dL (ref 0.0–149.0)
VLDL: 11 mg/dL (ref 0.0–40.0)

## 2017-04-25 LAB — VITAMIN D 25 HYDROXY (VIT D DEFICIENCY, FRACTURES): VITD: 16.43 ng/mL — ABNORMAL LOW (ref 30.00–100.00)

## 2017-04-26 ENCOUNTER — Other Ambulatory Visit: Payer: BLUE CROSS/BLUE SHIELD

## 2017-04-27 ENCOUNTER — Other Ambulatory Visit: Payer: Self-pay | Admitting: Family Medicine

## 2017-04-27 MED ORDER — VITAMIN D (ERGOCALCIFEROL) 1.25 MG (50000 UNIT) PO CAPS: 50000.0000 [IU] | ORAL_CAPSULE | ORAL | 4 refills | Status: DC

## 2017-04-28 ENCOUNTER — Other Ambulatory Visit: Payer: BLUE CROSS/BLUE SHIELD

## 2017-05-05 ENCOUNTER — Ambulatory Visit: Payer: BLUE CROSS/BLUE SHIELD | Admitting: Family Medicine

## 2017-05-05 VITALS — BP 118/62 | HR 72 | Temp 98.2°F | Resp 18 | Wt 127.0 lb

## 2017-05-05 DIAGNOSIS — R002 Palpitations: Secondary | ICD-10-CM | POA: Diagnosis not present

## 2017-05-05 DIAGNOSIS — E782 Mixed hyperlipidemia: Secondary | ICD-10-CM

## 2017-05-05 DIAGNOSIS — R739 Hyperglycemia, unspecified: Secondary | ICD-10-CM | POA: Diagnosis not present

## 2017-05-05 DIAGNOSIS — L309 Dermatitis, unspecified: Secondary | ICD-10-CM

## 2017-05-05 DIAGNOSIS — I10 Essential (primary) hypertension: Secondary | ICD-10-CM

## 2017-05-05 DIAGNOSIS — J45909 Unspecified asthma, uncomplicated: Secondary | ICD-10-CM

## 2017-05-05 LAB — COMPREHENSIVE METABOLIC PANEL
ALT: 17 U/L (ref 0–35)
AST: 24 U/L (ref 0–37)
Albumin: 3.9 g/dL (ref 3.5–5.2)
Alkaline Phosphatase: 63 U/L (ref 39–117)
BUN: 13 mg/dL (ref 6–23)
CALCIUM: 8.9 mg/dL (ref 8.4–10.5)
CHLORIDE: 104 meq/L (ref 96–112)
CO2: 27 meq/L (ref 19–32)
CREATININE: 0.68 mg/dL (ref 0.40–1.20)
GFR: 97.57 mL/min (ref >60.00)
Glucose, Bld: 93 mg/dL (ref 70–99)
Potassium: 3.5 mEq/L (ref 3.5–5.1)
Sodium: 139 mEq/L (ref 135–145)
Total Bilirubin: 0.5 mg/dL (ref 0.2–1.2)
Total Protein: 7.7 g/dL (ref 6.0–8.3)

## 2017-05-05 LAB — HEMOGLOBIN A1C: Hgb A1c MFr Bld: 5.2 % (ref 4.6–6.5)

## 2017-05-05 MED ORDER — TRIAMCINOLONE 0.1 % CREAM:EUCERIN CREAM 1:1: 1.0000 "application " | TOPICAL_CREAM | Freq: Two times a day (BID) | CUTANEOUS | 2 refills | Status: DC | PRN

## 2017-05-05 NOTE — Assessment & Plan Note (Signed)
Well controlled, no changes to meds. Encouraged heart healthy diet such as the DASH diet and exercise as tolerated.  °

## 2017-05-05 NOTE — Assessment & Plan Note (Signed)
Infrequent at this time. 

## 2017-05-05 NOTE — Progress Notes (Signed)
Subjective:  I acted as a Education administrator for Dr. Charlett Blake. [name], [place]  Patient ID: Gail Levine, female    DOB: November 02, 1965, 52 y.o.   MRN: [id number]  No chief complaint on file.   HPI  Patient is in today for a 3 month follow up she is following up on her HTN and other medical concerns. No recent febrile illness or acute hospitalizations. Denies CP/palp/SOB/HA/congestion/fevers/GI or GU c/o. Taking meds as prescribed. Is noting dry, cracked itchy skin b/l hands from frequent hand washing. No recent flare in palpitations or asthma.    Patient Care Team: Mosie Lukes, MD as PCP - General (Family Medicine)   Past Medical History:  Diagnosis Date  . Acute bronchitis 09/09/2015  . Allergy   . Arthritis   . Asthma   . Asthma 09/09/2015  . Back pain 01/08/2016  . Breast cancer, left breast ([place])    2007   . Cancer ([place])    2007  . Cervical cancer screening 01/01/2015  . Constipation 11/23/2015  . Dermatitis 01/04/2015  . Estrogen deficient vulvovaginitis 09/14/2014  . Gout   . Gout 06/03/2016  . HIV infection ([place])   . HTN (hypertension), benign 11/23/2015  . Hyperlipidemia   . Positive PPD   . Preventative health care 01/20/2017  . Pruritus 09/04/2014  . Thyroid disease   . Vitamin D deficiency     Past Surgical History:  Procedure Laterality Date  . MASTECTOMY     left breast    Family History  Problem Relation Age of Onset  . Cancer Mother        cancer  . Hypertension Father   . Stroke Father   . Heart disease Father   . Diabetes Father     Social History   Socioeconomic History  . Marital status: Single    Spouse name: Not on file  . Number of children: Not on file  . Years of education: Not on file  . Highest education level: Not on file  Occupational History  . Occupation: patient care coordinator  Social Needs  . Financial resource strain: Not on file  . Food insecurity:    Worry: Not on file    Inability: Not on file  . Transportation needs:    Medical: Not  on file    Non-medical: Not on file  Tobacco Use  . Smoking status: Never Smoker  . Smokeless tobacco: Never Used  Substance and Sexual Activity  . Alcohol use: Yes    Alcohol/week: 0.0 oz  . Drug use: No  . Sexual activity: Never  Lifestyle  . Physical activity:    Days per week: Not on file    Minutes per session: Not on file  . Stress: Not on file  Relationships  . Social connections:    Talks on phone: Not on file    Gets together: Not on file    Attends religious service: Not on file    Active member of club or organization: Not on file    Attends meetings of clubs or organizations: Not on file    Relationship status: Not on file  . Intimate partner violence:    Fear of current or ex partner: Not on file    Emotionally abused: Not on file    Physically abused: Not on file    Forced sexual activity: Not on file  Other Topics Concern  . Not on file  Social History Narrative  . Not on file  Outpatient Medications Prior to Visit  Medication Sig Dispense Refill  . allopurinol (ZYLOPRIM) 100 MG tablet TAKE 1 TABLET (100 MG TOTAL) BY MOUTH DAILY. 30 tablet 2  . Ascorbic Acid (VITAMIN C) 1000 MG tablet Take 1,000 mg by mouth daily.    Marland Kitchen aspirin EC 81 MG tablet Take 1 tablet (81 mg total) by mouth daily.    Marland Kitchen b complex vitamins tablet Take 1 tablet by mouth daily.    . Beclomethasone Diprop HFA (QVAR REDIHALER) 40 MCG/ACT AERB Inhale 1 puff into the lungs daily. 1 Inhaler 3  . calcium carbonate (OS-CAL) 600 MG TABS tablet Take 1,800 mg by mouth 2 (two) times daily with a meal.    . cetirizine (ZYRTEC) 10 MG tablet Take 10 mg by mouth daily.    Marland Kitchen diltiazem (CARDIZEM) 30 MG tablet Take 1 tablet (30 mg total) by mouth 3 (three) times daily.    . diphenhydrAMINE (BENADRYL) 25 MG tablet Take 25 mg by mouth every 6 (six) hours as needed for itching.     . hydrochlorothiazide (HYDRODIURIL) 25 MG tablet 1/2 tablet by mouth every day    . HYDROcodone-acetaminophen (NORCO) 5-325 MG  tablet Take 1 tablet by mouth every 6 (six) hours as needed for moderate pain. 8 tablet 0  . levalbuterol (XOPENEX HFA) 45 MCG/ACT inhaler Inhale 1 puff into the lungs every 6 (six) hours as needed for wheezing or shortness of breath. Fill as daw. They do have permission to fill either but go ahead and rewrite. 1 Inhaler 5  . LORazepam (ATIVAN) 0.5 MG tablet Take 1 tablet (0.5 mg total) by mouth 2 (two) times daily as needed for anxiety. 10 tablet 1  . methocarbamol (ROBAXIN) 500 MG tablet Take 1 tablet (500 mg total) by mouth every 8 (eight) hours as needed for muscle spasms. 30 tablet 1  . Multiple Vitamin (MULTIVITAMIN) tablet Take 1 tablet by mouth daily.    . ondansetron (ZOFRAN) 4 MG tablet Take 1 tablet (4 mg total) by mouth every 8 (eight) hours as needed for nausea or vomiting. 30 tablet 1  . Vitamin D, Ergocalciferol, (DRISDOL) 50000 units CAPS capsule Take 1 capsule (50,000 Units total) by mouth every 7 (seven) days. 4 capsule 4   No facility-administered medications prior to visit.     Allergies  Allergen Reactions  . Sulfa Antibiotics Anaphylaxis  . Penicillins Rash    Review of Systems  Constitutional: Negative for fever and malaise/fatigue.  HENT: Negative for congestion.   Eyes: Negative for blurred vision.  Respiratory: Negative for cough and shortness of breath.   Cardiovascular: Negative for chest pain, palpitations and leg swelling.  Gastrointestinal: Negative for vomiting.  Musculoskeletal: Negative for back pain.  Skin: Positive for itching and rash.       Dry, cracked skin on b/l hands.   Neurological: Negative for loss of consciousness and headaches.       Objective:    Physical Exam  Constitutional: She is oriented to person, place, and time. She appears well-developed and well-nourished. No distress.  HENT:  Head: Normocephalic and atraumatic.  Eyes: Conjunctivae are normal.  Neck: Normal range of motion. No thyromegaly present.  Cardiovascular: Normal  rate and regular rhythm.  Pulmonary/Chest: Effort normal and breath sounds normal. She has no wheezes.  Abdominal: Soft. Bowel sounds are normal. There is no tenderness.  Musculoskeletal: Normal range of motion. She exhibits no edema or deformity.  Neurological: She is alert and oriented to person, place, and time.  Skin: Skin is warm and dry. She is not diaphoretic.  Psychiatric: She has a normal mood and affect.    BP 118/62 (BP Location: Left Arm, Patient Position: Sitting, Cuff Size: Normal)   Pulse 72   Temp 98.2 F (36.8 C) (Oral)   Resp 18   Wt 127 lb (57.6 kg)   SpO2 96%   BMI 24.80 kg/m  Wt Readings from Last 3 Encounters:  05/05/17 127 lb (57.6 kg)  10/28/16 128 lb (58.1 kg)  07/01/16 123 lb (55.8 kg)   BP Readings from Last 3 Encounters:  05/05/17 118/62  10/28/16 108/60  07/01/16 116/66     Immunization History  Administered Date(s) Administered  . Influenza, Seasonal, Injecte, Preservative Fre 12/26/2006  . Tdap 10/28/2016    Health Maintenance  Topic Date Due  . HIV Screening  12/03/1982  . INFLUENZA VACCINE  09/22/2017 (Originally 08/31/2017)  . MAMMOGRAM  12/16/2017  . PAP SMEAR  12/31/2017  . TETANUS/TDAP  10/29/2026    Lab Results  Component Value Date   WBC 3.9 (L) 04/25/2017   HGB 13.4 04/25/2017   HCT 39.9 04/25/2017   PLT 215.0 04/25/2017   GLUCOSE 93 05/05/2017   CHOL 182 04/25/2017   TRIG 55.0 04/25/2017   HDL 63.90 04/25/2017   LDLCALC 108 (H) 04/25/2017   ALT 17 05/05/2017   AST 24 05/05/2017   NA 139 05/05/2017   K 3.5 05/05/2017   CL 104 05/05/2017   CREATININE 0.68 05/05/2017   BUN 13 05/05/2017   CO2 27 05/05/2017   TSH 4.04 04/25/2017   HGBA1C 5.2 05/05/2017    Lab Results  Component Value Date   TSH 4.04 04/25/2017   Lab Results  Component Value Date   WBC 3.9 (L) 04/25/2017   HGB 13.4 04/25/2017   HCT 39.9 04/25/2017   MCV 92.8 04/25/2017   PLT 215.0 04/25/2017   Lab Results  Component Value Date   NA 139  05/05/2017   K 3.5 05/05/2017   CHLORIDE 104 07/01/2016   CO2 27 05/05/2017   GLUCOSE 93 05/05/2017   BUN 13 05/05/2017   CREATININE 0.68 05/05/2017   BILITOT 0.5 05/05/2017   ALKPHOS 63 05/05/2017   AST 24 05/05/2017   ALT 17 05/05/2017   PROT 7.7 05/05/2017   ALBUMIN 3.9 05/05/2017   CALCIUM 8.9 05/05/2017   ANIONGAP 7 07/01/2016   EGFR 90 (L) 07/01/2016   GFR 97.57 05/05/2017   Lab Results  Component Value Date   CHOL 182 04/25/2017   Lab Results  Component Value Date   HDL 63.90 04/25/2017   Lab Results  Component Value Date   LDLCALC 108 (H) 04/25/2017   Lab Results  Component Value Date   TRIG 55.0 04/25/2017   Lab Results  Component Value Date   CHOLHDL 3 04/25/2017   Lab Results  Component Value Date   HGBA1C 5.2 05/05/2017         Assessment & Plan:   Problem List Items Addressed This Visit    Hyperlipidemia    Encouraged heart healthy diet, increase exercise, avoid trans fats, consider a krill oil cap daily      Relevant Orders   Amb ref to Medical Nutrition Therapy-MNT   Lipid panel   Palpitations    Infrequent at this time.       Asthma    No recent exacerbations. Responds well to Xopenex prn       HTN (hypertension), benign    Well controlled, no  changes to meds. Encouraged heart healthy diet such as the DASH diet and exercise as tolerated.       Relevant Orders   CBC   TSH   Hyperglycemia - Primary    minimize simple carbs. Increase exercise as tolerated.       Relevant Orders   Hemoglobin A1c (Completed)   Comprehensive metabolic panel (Completed)   Amb ref to Medical Nutrition Therapy-MNT   Hemoglobin A1c   Comprehensive metabolic panel      I am having Shresta Empson start on triamcinolone 0.1 % cream : eucerin. I am also having her maintain her multivitamin, vitamin C, calcium carbonate, cetirizine, b complex vitamins, diphenhydrAMINE, LORazepam, methocarbamol, HYDROcodone-acetaminophen, ondansetron, diltiazem, aspirin EC,  hydrochlorothiazide, beclomethasone, levalbuterol, allopurinol, and Vitamin D (Ergocalciferol).  Meds ordered this encounter  Medications  . Triamcinolone Acetonide (TRIAMCINOLONE 0.1 % CREAM : EUCERIN) CREA    Sig: Apply 1 application topically 2 (two) times daily as needed. One to one ratio    Dispense:  1 each    Refill:  2    CMA served as scribe during this visit. History, Physical and Plan performed by medical provider. Documentation and orders reviewed and attested to.  Penni Homans, MD

## 2017-05-05 NOTE — Assessment & Plan Note (Signed)
Encouraged heart healthy diet, increase exercise, avoid trans fats, consider a krill oil cap daily 

## 2017-05-05 NOTE — Assessment & Plan Note (Signed)
Triamcinolone, eucerin cream and moist gloves qhs to lesions on hands from frequent hand washing.

## 2017-05-05 NOTE — Assessment & Plan Note (Signed)
minimize simple carbs. Increase exercise as tolerated.  

## 2017-05-05 NOTE — Patient Instructions (Signed)
MIND diet  DASH Eating Plan DASH stands for "Dietary Approaches to Stop Hypertension." The DASH eating plan is a healthy eating plan that has been shown to reduce high blood pressure (hypertension). It may also reduce your risk for type 2 diabetes, heart disease, and stroke. The DASH eating plan may also help with weight loss. What are tips for following this plan? General guidelines  Avoid eating more than 2,300 mg (milligrams) of salt (sodium) a day. If you have hypertension, you may need to reduce your sodium intake to 1,500 mg a day.  Limit alcohol intake to no more than 1 drink a day for nonpregnant women and 2 drinks a day for men. One drink equals 12 oz of beer, 5 oz of wine, or 1 oz of hard liquor.  Work with your health care provider to maintain a healthy body weight or to lose weight. Ask what an ideal weight is for you.  Get at least 30 minutes of exercise that causes your heart to beat faster (aerobic exercise) most days of the week. Activities may include walking, swimming, or biking.  Work with your health care provider or diet and nutrition specialist (dietitian) to adjust your eating plan to your individual calorie needs. Reading food labels  Check food labels for the amount of sodium per serving. Choose foods with less than 5 percent of the Daily Value of sodium. Generally, foods with less than 300 mg of sodium per serving fit into this eating plan.  To find whole grains, look for the word "whole" as the first word in the ingredient list. Shopping  Buy products labeled as "low-sodium" or "no salt added."  Buy fresh foods. Avoid canned foods and premade or frozen meals. Cooking  Avoid adding salt when cooking. Use salt-free seasonings or herbs instead of table salt or sea salt. Check with your health care provider or pharmacist before using salt substitutes.  Do not fry foods. Cook foods using healthy methods such as baking, boiling, grilling, and broiling  instead.  Cook with heart-healthy oils, such as olive, canola, soybean, or sunflower oil. Meal planning   Eat a balanced diet that includes: ? 5 or more servings of fruits and vegetables each day. At each meal, try to fill half of your plate with fruits and vegetables. ? Up to 6-8 servings of whole grains each day. ? Less than 6 oz of lean meat, poultry, or fish each day. A 3-oz serving of meat is about the same size as a deck of cards. One egg equals 1 oz. ? 2 servings of low-fat dairy each day. ? A serving of nuts, seeds, or beans 5 times each week. ? Heart-healthy fats. Healthy fats called Omega-3 fatty acids are found in foods such as flaxseeds and coldwater fish, like sardines, salmon, and mackerel.  Limit how much you eat of the following: ? Canned or prepackaged foods. ? Food that is high in trans fat, such as fried foods. ? Food that is high in saturated fat, such as fatty meat. ? Sweets, desserts, sugary drinks, and other foods with added sugar. ? Full-fat dairy products.  Do not salt foods before eating.  Try to eat at least 2 vegetarian meals each week.  Eat more home-cooked food and less restaurant, buffet, and fast food.  When eating at a restaurant, ask that your food be prepared with less salt or no salt, if possible. What foods are recommended? The items listed may not be a complete list. Talk with your   with your dietitian about what dietary choices are best for you. Grains Whole-grain or whole-wheat bread. Whole-grain or whole-wheat pasta. Brown rice. [place]row. Bulgur. Whole-grain and low-sodium cereals. Pita bread. Low-fat, low-sodium crackers. Whole-wheat flour tortillas. Vegetables Fresh or frozen vegetables (raw, steamed, roasted, or grilled). Low-sodium or reduced-sodium tomato and vegetable juice. Low-sodium or reduced-sodium tomato sauce and tomato paste. Low-sodium or reduced-sodium canned vegetables. Fruits All fresh, dried, or frozen fruit. Canned fruit in  natural juice (without added sugar). Meat and other protein foods Skinless chicken or [place]. Ground chicken or [place]. Pork with fat trimmed off. Fish and seafood. Egg whites. Dried beans, peas, or lentils. Unsalted nuts, nut butters, and seeds. Unsalted canned beans. Lean cuts of beef with fat trimmed off. Low-sodium, lean deli meat. Dairy Low-fat (1%) or fat-free (skim) milk. Fat-free, low-fat, or reduced-fat cheeses. Nonfat, low-sodium ricotta or cottage cheese. Low-fat or nonfat yogurt. Low-fat, low-sodium cheese. Fats and oils Soft margarine without trans fats. Vegetable oil. Low-fat, reduced-fat, or light mayonnaise and salad dressings (reduced-sodium). Canola, safflower, olive, soybean, and sunflower oils. Avocado. Seasoning and other foods Herbs. Spices. Seasoning mixes without salt. Unsalted popcorn and pretzels. Fat-free sweets. What foods are not recommended? The items listed may not be a complete list. Talk with your dietitian about what dietary choices are best for you. Grains Baked goods made with fat, such as croissants, muffins, or some breads. Dry pasta or rice meal packs. Vegetables Creamed or fried vegetables. Vegetables in a cheese sauce. Regular canned vegetables (not low-sodium or reduced-sodium). Regular canned tomato sauce and paste (not low-sodium or reduced-sodium). Regular tomato and vegetable juice (not low-sodium or reduced-sodium). Angie Fava. Olives. Fruits Canned fruit in a light or heavy syrup. Fried fruit. Fruit in cream or butter sauce. Meat and other protein foods Fatty cuts of meat. Ribs. Fried meat. Berniece Salines. Sausage. Bologna and other processed lunch meats. Salami. Fatback. Hotdogs. Bratwurst. Salted nuts and seeds. Canned beans with added salt. Canned or smoked fish. Whole eggs or egg yolks. Chicken or [place] with skin. Dairy Whole or 2% milk, cream, and half-and-half. Whole or full-fat cream cheese. Whole-fat or sweetened yogurt. Full-fat cheese. Nondairy  creamers. Whipped toppings. Processed cheese and cheese spreads. Fats and oils Butter. Stick margarine. Lard. Shortening. Ghee. Bacon fat. Tropical oils, such as coconut, palm kernel, or palm oil. Seasoning and other foods Salted popcorn and pretzels. Onion salt, garlic salt, seasoned salt, table salt, and sea salt. Worcestershire sauce. Tartar sauce. Barbecue sauce. Teriyaki sauce. Soy sauce, including reduced-sodium. Steak sauce. Canned and packaged gravies. Fish sauce. Oyster sauce. Cocktail sauce. Horseradish that you find on the shelf. Ketchup. Mustard. Meat flavorings and tenderizers. Bouillon cubes. Hot sauce and Tabasco sauce. Premade or packaged marinades. Premade or packaged taco seasonings. Relishes. Regular salad dressings. Where to find more information:  National Heart, Lung, and [place]: https://wilson-eaton.com/  American Heart Association: www.heart.org Summary  The DASH eating plan is a healthy eating plan that has been shown to reduce high blood pressure (hypertension). It may also reduce your risk for type 2 diabetes, heart disease, and stroke.  With the DASH eating plan, you should limit salt (sodium) intake to 2,300 mg a day. If you have hypertension, you may need to reduce your sodium intake to 1,500 mg a day.  When on the DASH eating plan, aim to eat more fresh fruits and vegetables, whole grains, lean proteins, low-fat dairy, and heart-healthy fats.  Work with your health care provider or diet and nutrition specialist (dietitian) to adjust your  eating plan to your individual calorie needs. This information is not intended to replace advice given to you by your health care provider. Make sure you discuss any questions you have with your health care provider. Document Released: 01/06/2011 Document Revised: 01/11/2016 Document Reviewed: 01/11/2016 Elsevier Interactive Patient Education  [employer].

## 2017-05-05 NOTE — Assessment & Plan Note (Signed)
No recent exacerbations. Responds well to Xopenex prn

## 2017-06-02 ENCOUNTER — Encounter: Payer: BLUE CROSS/BLUE SHIELD | Attending: Family Medicine | Admitting: Registered"

## 2017-06-02 ENCOUNTER — Encounter: Payer: Self-pay | Admitting: Registered"

## 2017-06-02 DIAGNOSIS — E782 Mixed hyperlipidemia: Secondary | ICD-10-CM | POA: Insufficient documentation

## 2017-06-02 DIAGNOSIS — R739 Hyperglycemia, unspecified: Secondary | ICD-10-CM | POA: Insufficient documentation

## 2017-06-02 DIAGNOSIS — Z713 Dietary counseling and surveillance: Secondary | ICD-10-CM | POA: Insufficient documentation

## 2017-06-02 NOTE — Progress Notes (Signed)
Medical Nutrition Therapy:  Appt start time: 1000 end time:  1045.   Assessment:  Primary concerns today: Patient states she is trying to follow a nutrition plan that will help her be a healthy weight which she believes will help her ankles and feet feel better. Patient states when she takes pain meds she gains weight.   Patient states she had surgery to remove breast cancer in 2007 and the wound is still not healing. Patient states she works 7 days a week (2 jobs) and gets 4 1/2 hrs to 6 hours of sleep each night for the past 10 years. Patient states she does not have any space in her day for regular exercise. Patient states very busy during her work hours. Patient states she is not able to get away during lunch breaks. 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  Patient states her family (mother and siblings) cook their meals together for the week and each will put their portions in the freezer. Patient states for awhile her mother took meat out of her sister's portions and put more meat in Pt's meals to help with wound healing but when patient did not Patella results she went back to the equal distribution of protein among them. Patient states they have a monthly budget and to stay within the budget the meat portions are limited to about a credit card size of meat 1 maybe 2 meals per day. Patient states they use olive oil sparingly due to cost and just a taste on their salad. A common breakfast food would be steamed burn which is a bread product that may or may not have beans or meat inside. Because the ones with meat are more expensive she cannot always have them.  Preferred Learning Style:   No preference indicated   Learning Readiness:   Ready  MEDICATIONS: reviewed   DIETARY INTAKE:  Usual eating pattern includes 3 meals and 0 snacks per day.  Everyday foods include  rice.  24-hr recall:  B ( AM): chinese bread products which would be equivalent to a bowl of cereal  Snk ( AM): none  L ( PM): 2 c rice, ~2 oz meat, 1/2-1 c vegetables, 1/2 piece of fruit Snk ( PM): none D ( PM): same as lunch, but may not be able to afford meat for second meal Snk ( PM): none Beverages: 8 oz coffee diluted with 20 oz water + add'l 60 oz water due to medications  Usual physical activity: ADLs  Estimated energy needs: 1800 calories 200 g carbohydrates 113 g protein 60 g fat  Progress Towards Goal(s):  (new goal).   Nutritional Diagnosis:  NI-5.7.1 Inadequate protein intake As related to financial constraints.  As evidenced by dietary recall.    Intervention:  Nutrition Educaiton. Described the role of protein in wound healing. Discussed the importance of adequate sleep for health.  Plan:  Work toward getting more sleep for a good foundation for everything else. Long-term goal is 8 hours, work slowly up to that, going to bed 10 min earlier at a time.  Get more protein in your diet  Breakfast: when not have meat or beans in the steamed burn considers eggs.  Consider preparing the rice with edamame or chick peas for more protein.  Teaching Method Utilized:  Visual Auditory  Handouts given during visit include:  Barriers to  learning/adherence to lifestyle change: financial constraints  Demonstrated degree of understanding via:  Teach Back   Monitoring/Evaluation:  Dietary intake, exercise, pain status and body weight in 6 month(s).

## 2017-06-02 NOTE — Patient Instructions (Signed)
Work toward getting more sleep for a good foundation for everything else. Long-term goal is 8 hours, work slowly up to that, going to bed 10 min earlier at a time. Get more protein in your diet Breakfast when not have meat or beans in the steamed burn considers eggs. Consider preparing the rice with edamame or chick peas for more protein.

## 2017-06-09 DIAGNOSIS — H04123 Dry eye syndrome of bilateral lacrimal glands: Secondary | ICD-10-CM | POA: Diagnosis not present

## 2017-06-23 ENCOUNTER — Other Ambulatory Visit: Payer: BLUE CROSS/BLUE SHIELD

## 2017-06-23 ENCOUNTER — Ambulatory Visit: Payer: BLUE CROSS/BLUE SHIELD | Admitting: Hematology & Oncology

## 2017-07-28 ENCOUNTER — Other Ambulatory Visit: Payer: BLUE CROSS/BLUE SHIELD

## 2017-08-04 ENCOUNTER — Ambulatory Visit: Payer: BLUE CROSS/BLUE SHIELD | Admitting: Family Medicine

## 2017-08-04 ENCOUNTER — Other Ambulatory Visit (INDEPENDENT_AMBULATORY_CARE_PROVIDER_SITE_OTHER): Payer: BLUE CROSS/BLUE SHIELD

## 2017-08-04 DIAGNOSIS — E782 Mixed hyperlipidemia: Secondary | ICD-10-CM

## 2017-08-04 DIAGNOSIS — I10 Essential (primary) hypertension: Secondary | ICD-10-CM | POA: Diagnosis not present

## 2017-08-04 DIAGNOSIS — R739 Hyperglycemia, unspecified: Secondary | ICD-10-CM

## 2017-08-04 LAB — LIPID PANEL
CHOL/HDL RATIO: 3
Cholesterol: 191 mg/dL (ref 0–200)
HDL: 66.2 mg/dL (ref >39.00)
LDL CALC: 110 mg/dL — AB (ref 0–99)
NonHDL: 124.63
TRIGLYCERIDES: 75 mg/dL (ref 0.0–149.0)
VLDL: 15 mg/dL (ref 0.0–40.0)

## 2017-08-04 LAB — COMPREHENSIVE METABOLIC PANEL
ALT: 13 U/L (ref 0–35)
AST: 20 U/L (ref 0–37)
Albumin: 4.2 g/dL (ref 3.5–5.2)
Alkaline Phosphatase: 63 U/L (ref 39–117)
BUN: 10 mg/dL (ref 6–23)
CALCIUM: 9.1 mg/dL (ref 8.4–10.5)
CO2: 33 meq/L — AB (ref 19–32)
CREATININE: 0.63 mg/dL (ref 0.40–1.20)
Chloride: 101 mEq/L (ref 96–112)
GFR: 106.46 mL/min (ref >60.00)
Glucose, Bld: 81 mg/dL (ref 70–99)
POTASSIUM: 4.3 meq/L (ref 3.5–5.1)
SODIUM: 141 meq/L (ref 135–145)
Total Bilirubin: 0.7 mg/dL (ref 0.2–1.2)
Total Protein: 7.7 g/dL (ref 6.0–8.3)

## 2017-08-04 LAB — CBC
HCT: 40.7 % (ref 36.0–46.0)
HEMOGLOBIN: 13.8 g/dL (ref 12.0–15.0)
MCHC: 33.8 g/dL (ref 30.0–36.0)
MCV: 92.9 fl (ref 78.0–100.0)
Platelets: 199 10*3/uL (ref 150.0–400.0)
RBC: 4.38 Mil/uL (ref 3.87–5.11)
RDW: 12.9 % (ref 11.5–15.5)
WBC: 3.9 10*3/uL — ABNORMAL LOW (ref 4.0–10.5)

## 2017-08-04 LAB — TSH: TSH: 3.81 u[IU]/mL (ref 0.35–4.50)

## 2017-08-04 LAB — HEMOGLOBIN A1C: HEMOGLOBIN A1C: 5.2 % (ref 4.6–6.5)

## 2017-08-11 ENCOUNTER — Ambulatory Visit: Payer: BLUE CROSS/BLUE SHIELD | Admitting: Family Medicine

## 2017-08-11 DIAGNOSIS — R739 Hyperglycemia, unspecified: Secondary | ICD-10-CM | POA: Diagnosis not present

## 2017-08-11 DIAGNOSIS — E559 Vitamin D deficiency, unspecified: Secondary | ICD-10-CM | POA: Diagnosis not present

## 2017-08-11 DIAGNOSIS — I10 Essential (primary) hypertension: Secondary | ICD-10-CM | POA: Diagnosis not present

## 2017-08-11 DIAGNOSIS — E782 Mixed hyperlipidemia: Secondary | ICD-10-CM | POA: Diagnosis not present

## 2017-08-11 DIAGNOSIS — M109 Gout, unspecified: Secondary | ICD-10-CM | POA: Diagnosis not present

## 2017-08-11 MED ORDER — VITAMIN D (ERGOCALCIFEROL) 1.25 MG (50000 UNIT) PO CAPS: 50000.0000 [IU] | ORAL_CAPSULE | ORAL | 5 refills | Status: DC

## 2017-08-11 NOTE — Progress Notes (Signed)
Patient ID: Gail Levine, female   DOB: 01-23-1966, 52 y.o.   MRN: [id number]   Subjective:    Patient ID: Gail Levine, female    DOB: May 14, 1965, 52 y.o.   MRN: [id number]  No chief complaint on file.   HPI Patient is in today for follow up and she is doing well. No recent febrile illness or hospitalizations. She is maintaining a heart healthy diet and stays active. Denies CP/palp/SOB/HA/congestion/fevers/GI or GU c/o. Taking meds as prescribed  Past Medical History:  Diagnosis Date  . Acute bronchitis 09/09/2015  . Allergy   . Arthritis   . Asthma   . Asthma 09/09/2015  . Back pain 01/08/2016  . Breast cancer, left breast ([place])    2007   . Cancer ([place])    2007  . Cervical cancer screening 01/01/2015  . Constipation 11/23/2015  . Dermatitis 01/04/2015  . Estrogen deficient vulvovaginitis 09/14/2014  . Gout   . Gout 06/03/2016  . HIV infection ([place])   . HTN (hypertension), benign 11/23/2015  . Hyperlipidemia   . Positive PPD   . Preventative health care 01/20/2017  . Pruritus 09/04/2014  . Thyroid disease   . Vitamin D deficiency     Past Surgical History:  Procedure Laterality Date  . MASTECTOMY     left breast    Family History  Problem Relation Age of Onset  . Cancer Mother        cancer  . Hypertension Father   . Stroke Father   . Heart disease Father   . Diabetes Father     Social History   Socioeconomic History  . Marital status: Single    Spouse name: Not on file  . Number of children: Not on file  . Years of education: Not on file  . Highest education level: Not on file  Occupational History  . Occupation: patient care coordinator  Social Needs  . Financial resource strain: Not on file  . Food insecurity:    Worry: Not on file    Inability: Not on file  . Transportation needs:    Medical: Not on file    Non-medical: Not on file  Tobacco Use  . Smoking status: Never Smoker  . Smokeless tobacco: Never Used  Substance and Sexual Activity  . Alcohol use: Yes      Alcohol/week: 0.0 oz  . Drug use: No  . Sexual activity: Never  Lifestyle  . Physical activity:    Days per week: Not on file    Minutes per session: Not on file  . Stress: Not on file  Relationships  . Social connections:    Talks on phone: Not on file    Gets together: Not on file    Attends religious service: Not on file    Active member of club or organization: Not on file    Attends meetings of clubs or organizations: Not on file    Relationship status: Not on file  . Intimate partner violence:    Fear of current or ex partner: Not on file    Emotionally abused: Not on file    Physically abused: Not on file    Forced sexual activity: Not on file  Other Topics Concern  . Not on file  Social History Narrative  . Not on file    Outpatient Medications Prior to Visit  Medication Sig Dispense Refill  . allopurinol (ZYLOPRIM) 100 MG tablet TAKE 1 TABLET (100 MG TOTAL) BY MOUTH DAILY. 30 tablet 2  .  Ascorbic Acid (VITAMIN C) 1000 MG tablet Take 1,000 mg by mouth daily.    Marland Kitchen aspirin EC 81 MG tablet Take 1 tablet (81 mg total) by mouth daily.    Marland Kitchen b complex vitamins tablet Take 1 tablet by mouth daily.    . Beclomethasone Diprop HFA (QVAR REDIHALER) 40 MCG/ACT AERB Inhale 1 puff into the lungs daily. 1 Inhaler 3  . calcium carbonate (OS-CAL) 600 MG TABS tablet Take 1,800 mg by mouth 2 (two) times daily with a meal.    . cetirizine (ZYRTEC) 10 MG tablet Take 10 mg by mouth daily.    Marland Kitchen diltiazem (CARDIZEM) 30 MG tablet Take 1 tablet (30 mg total) by mouth 3 (three) times daily.    . diphenhydrAMINE (BENADRYL) 25 MG tablet Take 25 mg by mouth every 6 (six) hours as needed for itching.     . hydrochlorothiazide (HYDRODIURIL) 25 MG tablet Take 0.5 tablets (12.5 mg total) by mouth daily as needed. 1/2 tablet by mouth every day    . levalbuterol (XOPENEX HFA) 45 MCG/ACT inhaler Inhale 1 puff into the lungs every 6 (six) hours as needed for wheezing or shortness of breath. Fill as daw.  They do have permission to fill either but go ahead and rewrite. 1 Inhaler 5  . Multiple Vitamin (MULTIVITAMIN) tablet Take 1 tablet by mouth daily.    . hydrochlorothiazide (HYDRODIURIL) 25 MG tablet 1/2 tablet by mouth every day    . HYDROcodone-acetaminophen (NORCO) 5-325 MG tablet Take 1 tablet by mouth every 6 (six) hours as needed for moderate pain. 8 tablet 0  . LORazepam (ATIVAN) 0.5 MG tablet Take 1 tablet (0.5 mg total) by mouth 2 (two) times daily as needed for anxiety. 10 tablet 1  . methocarbamol (ROBAXIN) 500 MG tablet Take 1 tablet (500 mg total) by mouth every 8 (eight) hours as needed for muscle spasms. 30 tablet 1  . ondansetron (ZOFRAN) 4 MG tablet Take 1 tablet (4 mg total) by mouth every 8 (eight) hours as needed for nausea or vomiting. 30 tablet 1  . Triamcinolone Acetonide (TRIAMCINOLONE 0.1 % CREAM : EUCERIN) CREA Apply 1 application topically 2 (two) times daily as needed. One to one ratio 1 each 2  . Vitamin D, Ergocalciferol, (DRISDOL) 50000 units CAPS capsule Take 1 capsule (50,000 Units total) by mouth every 7 (seven) days. 4 capsule 4   No facility-administered medications prior to visit.     Allergies  Allergen Reactions  . Sulfa Antibiotics Anaphylaxis  . Penicillins Rash    Review of Systems  Constitutional: Negative for fever and malaise/fatigue.  HENT: Negative for congestion.   Eyes: Negative for blurred vision.  Respiratory: Negative for shortness of breath.   Cardiovascular: Negative for chest pain, palpitations and leg swelling.  Gastrointestinal: Negative for abdominal pain, blood in stool and nausea.  Genitourinary: Negative for dysuria and frequency.  Musculoskeletal: Negative for falls.  Skin: Negative for rash.  Neurological: Negative for dizziness, loss of consciousness and headaches.  Endo/Heme/Allergies: Negative for environmental allergies.  Psychiatric/Behavioral: Negative for depression. The patient is not nervous/anxious.          Objective:    Physical Exam  Constitutional: She is oriented to person, place, and time. She appears well-developed and well-nourished. No distress.  HENT:  Head: Normocephalic and atraumatic.  Nose: Nose normal.  Eyes: Right eye exhibits no discharge. Left eye exhibits no discharge.  Neck: Normal range of motion. Neck supple.  Cardiovascular: Normal rate and regular rhythm.  No murmur heard. Pulmonary/Chest: Effort normal and breath sounds normal.  Abdominal: Soft. Bowel sounds are normal. There is no tenderness.  Musculoskeletal: She exhibits no edema.  Neurological: She is alert and oriented to person, place, and time.  Skin: Skin is warm and dry.  Psychiatric: She has a normal mood and affect.  Nursing note and vitals reviewed.   BP 98/70 (BP Location: Left Arm, Patient Position: Sitting, Cuff Size: Normal)   Pulse 64   Temp 98.3 F (36.8 C) (Oral)   Resp 18   Wt 120 lb (54.4 kg)   SpO2 95%   BMI 23.44 kg/m  Wt Readings from Last 3 Encounters:  08/11/17 120 lb (54.4 kg)  05/05/17 127 lb (57.6 kg)  10/28/16 128 lb (58.1 kg)     Lab Results  Component Value Date   WBC 3.9 (L) 08/04/2017   HGB 13.8 08/04/2017   HCT 40.7 08/04/2017   PLT 199.0 08/04/2017   GLUCOSE 81 08/04/2017   CHOL 191 08/04/2017   TRIG 75.0 08/04/2017   HDL 66.20 08/04/2017   LDLCALC 110 (H) 08/04/2017   ALT 13 08/04/2017   AST 20 08/04/2017   NA 141 08/04/2017   K 4.3 08/04/2017   CL 101 08/04/2017   CREATININE 0.63 08/04/2017   BUN 10 08/04/2017   CO2 33 (H) 08/04/2017   TSH 3.81 08/04/2017   HGBA1C 5.2 08/04/2017    Lab Results  Component Value Date   TSH 3.81 08/04/2017   Lab Results  Component Value Date   WBC 3.9 (L) 08/04/2017   HGB 13.8 08/04/2017   HCT 40.7 08/04/2017   MCV 92.9 08/04/2017   PLT 199.0 08/04/2017   Lab Results  Component Value Date   NA 141 08/04/2017   K 4.3 08/04/2017   CHLORIDE 104 07/01/2016   CO2 33 (H) 08/04/2017   GLUCOSE 81 08/04/2017    BUN 10 08/04/2017   CREATININE 0.63 08/04/2017   BILITOT 0.7 08/04/2017   ALKPHOS 63 08/04/2017   AST 20 08/04/2017   ALT 13 08/04/2017   PROT 7.7 08/04/2017   ALBUMIN 4.2 08/04/2017   CALCIUM 9.1 08/04/2017   ANIONGAP 7 07/01/2016   EGFR 90 (L) 07/01/2016   GFR 106.46 08/04/2017   Lab Results  Component Value Date   CHOL 191 08/04/2017   Lab Results  Component Value Date   HDL 66.20 08/04/2017   Lab Results  Component Value Date   LDLCALC 110 (H) 08/04/2017   Lab Results  Component Value Date   TRIG 75.0 08/04/2017   Lab Results  Component Value Date   CHOLHDL 3 08/04/2017   Lab Results  Component Value Date   HGBA1C 5.2 08/04/2017       Assessment & Plan:   Problem List Items Addressed This Visit    Vitamin D deficiency    Check level and supplement      Relevant Orders   VITAMIN D 25 Hydroxy (Vit-D Deficiency, Fractures)   Hyperlipidemia    Encouraged heart healthy diet, increase exercise, avoid trans fats, consider a krill oil cap daily      Relevant Medications   hydrochlorothiazide (HYDRODIURIL) 25 MG tablet   Other Relevant Orders   Lipid panel   HTN (hypertension), benign    Well controlled, no changes to meds. Encouraged heart healthy diet such as the DASH diet and exercise as tolerated.       Relevant Medications   hydrochlorothiazide (HYDRODIURIL) 25 MG tablet   Other Relevant Orders  CBC   TSH   Comprehensive metabolic panel   Gout    Monitor uric acid       Relevant Orders   Uric acid   Hyperglycemia    hgba1c acceptable, minimize simple carbs. Increase exercise as tolerated.       Relevant Orders   Hemoglobin A1c      I have discontinued Zoie Mattox's LORazepam, methocarbamol, HYDROcodone-acetaminophen, ondansetron, and triamcinolone 0.1 % cream : eucerin. I have also changed her hydrochlorothiazide. Additionally, I am having her maintain her multivitamin, vitamin C, calcium carbonate, cetirizine, b complex vitamins,  diphenhydrAMINE, diltiazem, aspirin EC, beclomethasone, levalbuterol, allopurinol, and Vitamin D (Ergocalciferol).  Meds ordered this encounter  Medications  . Vitamin D, Ergocalciferol, (DRISDOL) 50000 units CAPS capsule    Sig: Take 1 capsule (50,000 Units total) by mouth every 7 (seven) days.    Dispense:  4 capsule    Refill:  5    Penni Homans, MD

## 2017-08-11 NOTE — Assessment & Plan Note (Signed)
Monitor uric acid

## 2017-08-11 NOTE — Patient Instructions (Signed)

## 2017-08-11 NOTE — Assessment & Plan Note (Signed)
hgba1c acceptable, minimize simple carbs. Increase exercise as tolerated.  

## 2017-08-11 NOTE — Assessment & Plan Note (Signed)
Check level and supplement 

## 2017-08-11 NOTE — Assessment & Plan Note (Signed)
Well controlled, no changes to meds. Encouraged heart healthy diet such as the DASH diet and exercise as tolerated.  °

## 2017-08-11 NOTE — Assessment & Plan Note (Signed)
Encouraged heart healthy diet, increase exercise, avoid trans fats, consider a krill oil cap daily 

## 2017-08-16 ENCOUNTER — Other Ambulatory Visit: Payer: Self-pay | Admitting: Family Medicine

## 2017-09-13 ENCOUNTER — Other Ambulatory Visit: Payer: Self-pay | Admitting: Family Medicine

## 2017-09-13 ENCOUNTER — Encounter: Payer: Self-pay | Admitting: Family Medicine

## 2017-09-13 MED ORDER — SUMATRIPTAN SUCCINATE 50 MG PO TABS: 50.0000 mg | ORAL_TABLET | ORAL | 1 refills | Status: DC | PRN

## 2017-10-06 DIAGNOSIS — R0602 Shortness of breath: Secondary | ICD-10-CM | POA: Diagnosis not present

## 2017-10-06 DIAGNOSIS — I10 Essential (primary) hypertension: Secondary | ICD-10-CM | POA: Diagnosis not present

## 2017-10-06 DIAGNOSIS — R600 Localized edema: Secondary | ICD-10-CM | POA: Diagnosis not present

## 2017-10-06 DIAGNOSIS — R002 Palpitations: Secondary | ICD-10-CM | POA: Diagnosis not present

## 2017-10-19 ENCOUNTER — Other Ambulatory Visit: Payer: Self-pay | Admitting: *Deleted

## 2017-10-19 DIAGNOSIS — Z171 Estrogen receptor negative status [ER-]: Principal | ICD-10-CM

## 2017-10-19 DIAGNOSIS — C50012 Malignant neoplasm of nipple and areola, left female breast: Secondary | ICD-10-CM

## 2017-10-20 ENCOUNTER — Inpatient Hospital Stay: Payer: BLUE CROSS/BLUE SHIELD | Attending: Hematology & Oncology

## 2017-10-27 ENCOUNTER — Other Ambulatory Visit: Payer: BLUE CROSS/BLUE SHIELD

## 2017-10-27 ENCOUNTER — Inpatient Hospital Stay: Payer: BLUE CROSS/BLUE SHIELD | Admitting: Hematology & Oncology

## 2017-11-14 ENCOUNTER — Other Ambulatory Visit: Payer: Self-pay | Admitting: Family Medicine

## 2017-12-22 ENCOUNTER — Encounter: Payer: Self-pay | Admitting: Family Medicine

## 2017-12-22 ENCOUNTER — Encounter: Payer: BLUE CROSS/BLUE SHIELD | Attending: Family Medicine | Admitting: Registered"

## 2017-12-22 DIAGNOSIS — E782 Mixed hyperlipidemia: Secondary | ICD-10-CM | POA: Insufficient documentation

## 2017-12-22 DIAGNOSIS — R739 Hyperglycemia, unspecified: Secondary | ICD-10-CM | POA: Diagnosis not present

## 2017-12-22 DIAGNOSIS — Z713 Dietary counseling and surveillance: Secondary | ICD-10-CM

## 2017-12-22 NOTE — Patient Instructions (Addendum)
Aim for 55-65 grams protein per day Continue to work on getting more sleep, minimum 6 hrs When craving soda, look for other solutions to help you handle the anger & frustration. Consider eating green vegetables regularly

## 2017-12-22 NOTE — Progress Notes (Signed)
Medical Nutrition Therapy:  Appt start time: 0800 end time:  0830.  Assessment:  Primary concerns today: Patient states heart health is her priority. Patinet states she has been working on getting more protein in her diet. Pt stated typical day has ~40 g protein. Patient had questions about sources of protein and serving sizes.  Patient states she has increased her sleep from 4 hours to 5 hours. Pt states she feels like she is dragging her feet all the time. Pt states her mother tell her she is getting too much sleep.   Patient states she has started drinking more soda recently, patient states she craves it when she is feeling angry or frustrated. 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   Patient states she has been checking her BG and it is staying in an appropriate range: FBG 90; 10 am 120 mg/dL. Per chart A1c 5.2% 4 mo ago  Next visit RD to check current vitamin D lab (last value was 16.43 on 03/2017)  Preferred Learning Style:   No preference indicated   Learning Readiness:   Ready  MEDICATIONS: reviewed   DIETARY INTAKE:  Usual eating pattern includes 3 meals and 0 snacks per day.  Everyday foods include rice.  24-hr recall:  B ( AM): 2 eggs Snk ( AM): none  L ( PM): 2 c rice, 1/2 c beans or small amt of meat, vegetables, 1/2 piece of fruit Snk ( PM): none D ( PM): same as lunch Snk ( PM): none Beverages: coffee, water, soda  Usual physical activity: ADLs  Estimated energy needs: 1800 calories 200 g carbohydrates 113 g protein 60 g fat  Progress Towards Goal(s):  (new goal).   Nutritional Diagnosis:  NI-5.7.1 Inadequate protein intake As related to financial constraints.  As evidenced by dietary recall.    Intervention:  Nutrition Educaiton. Described the role of protein in wound healing. Discussed the  importance of adequate sleep for health.  Plan: Aim for 55-65 grams protein per day Continue to work on getting more sleep, minimum 6 hrs When craving soda, look for other solutions to help you handle the anger & frustration. Consider eating green vegetables regularly  Teaching Method Utilized:  Visual Auditory  Handouts given during visit include: Vegetarian protein list, sleep hygiene, MyPlate Planner.  Barriers to learning/adherence to lifestyle change: financial constraints  Demonstrated degree of understanding via:  Teach Back   Monitoring/Evaluation:  Dietary intake, exercise, pain status and body weight in 6 month(s).

## 2018-02-06 ENCOUNTER — Other Ambulatory Visit (HOSPITAL_BASED_OUTPATIENT_CLINIC_OR_DEPARTMENT_OTHER): Payer: Self-pay | Admitting: Family Medicine

## 2018-02-06 DIAGNOSIS — Z1231 Encounter for screening mammogram for malignant neoplasm of breast: Secondary | ICD-10-CM

## 2018-02-09 ENCOUNTER — Ambulatory Visit (HOSPITAL_BASED_OUTPATIENT_CLINIC_OR_DEPARTMENT_OTHER)
Admission: RE | Admit: 2018-02-09 | Discharge: 2018-02-09 | Disposition: A | Discharge status: Home / Self Care | Payer: BLUE CROSS/BLUE SHIELD | Source: Ambulatory Visit | Attending: Family Medicine | Admitting: Family Medicine

## 2018-02-09 ENCOUNTER — Encounter (HOSPITAL_BASED_OUTPATIENT_CLINIC_OR_DEPARTMENT_OTHER): Payer: Self-pay

## 2018-02-09 DIAGNOSIS — Z1231 Encounter for screening mammogram for malignant neoplasm of breast: Secondary | ICD-10-CM | POA: Diagnosis not present

## 2018-02-21 ENCOUNTER — Other Ambulatory Visit: Payer: Self-pay | Admitting: Family Medicine

## 2018-02-23 ENCOUNTER — Other Ambulatory Visit (INDEPENDENT_AMBULATORY_CARE_PROVIDER_SITE_OTHER): Payer: BLUE CROSS/BLUE SHIELD

## 2018-02-23 DIAGNOSIS — I10 Essential (primary) hypertension: Secondary | ICD-10-CM

## 2018-02-23 DIAGNOSIS — E559 Vitamin D deficiency, unspecified: Secondary | ICD-10-CM | POA: Diagnosis not present

## 2018-02-23 DIAGNOSIS — E782 Mixed hyperlipidemia: Secondary | ICD-10-CM

## 2018-02-23 DIAGNOSIS — M109 Gout, unspecified: Secondary | ICD-10-CM | POA: Diagnosis not present

## 2018-02-23 DIAGNOSIS — R739 Hyperglycemia, unspecified: Secondary | ICD-10-CM | POA: Diagnosis not present

## 2018-02-23 LAB — COMPREHENSIVE METABOLIC PANEL
ALT: 9 U/L (ref 0–35)
AST: 19 U/L (ref 0–37)
Albumin: 4.2 g/dL (ref 3.5–5.2)
Alkaline Phosphatase: 61 U/L (ref 39–117)
BUN: 11 mg/dL (ref 6–23)
CO2: 29 mEq/L (ref 19–32)
Calcium: 9.3 mg/dL (ref 8.4–10.5)
Chloride: 102 mEq/L (ref 96–112)
Creatinine, Ser: 0.69 mg/dL (ref 0.40–1.20)
GFR: 89.97 mL/min (ref >60.00)
Glucose, Bld: 76 mg/dL (ref 70–99)
POTASSIUM: 4.1 meq/L (ref 3.5–5.1)
Sodium: 141 mEq/L (ref 135–145)
Total Bilirubin: 0.8 mg/dL (ref 0.2–1.2)
Total Protein: 7.6 g/dL (ref 6.0–8.3)

## 2018-02-23 LAB — CBC
HCT: 41.2 % (ref 36.0–46.0)
Hemoglobin: 13.5 g/dL (ref 12.0–15.0)
MCHC: 32.9 g/dL (ref 30.0–36.0)
MCV: 93.9 fl (ref 78.0–100.0)
Platelets: 201 10*3/uL (ref 150.0–400.0)
RBC: 4.38 Mil/uL (ref 3.87–5.11)
RDW: 13.1 % (ref 11.5–15.5)
WBC: 3.8 10*3/uL — ABNORMAL LOW (ref 4.0–10.5)

## 2018-02-23 LAB — LIPID PANEL
Cholesterol: 197 mg/dL (ref 0–200)
HDL: 67.9 mg/dL (ref >39.00)
LDL Cholesterol: 117 mg/dL — ABNORMAL HIGH (ref 0–99)
NonHDL: 129.17
Total CHOL/HDL Ratio: 3
Triglycerides: 61 mg/dL (ref 0.0–149.0)
VLDL: 12.2 mg/dL (ref 0.0–40.0)

## 2018-02-23 LAB — HEMOGLOBIN A1C: Hgb A1c MFr Bld: 5 % (ref 4.6–6.5)

## 2018-02-23 LAB — TSH: TSH: 4.37 u[IU]/mL (ref 0.35–4.50)

## 2018-02-23 LAB — VITAMIN D 25 HYDROXY (VIT D DEFICIENCY, FRACTURES): VITD: 16.31 ng/mL — ABNORMAL LOW (ref 30.00–100.00)

## 2018-02-23 LAB — URIC ACID: Uric Acid, Serum: 6.4 mg/dL (ref 2.4–7.0)

## 2018-02-23 IMAGING — MG DIGITAL DIAGNOSTIC UNILATERAL RIGHT MAMMOGRAM
4 series · 4 of 4 positions shown · non-contrast
Comparison: Previous exam(s).

CLINICAL DATA: Screening recall for right breast calcifications.

EXAM:
DIGITAL DIAGNOSTIC RIGHT MAMMOGRAM

[R ML (1 of 3)]
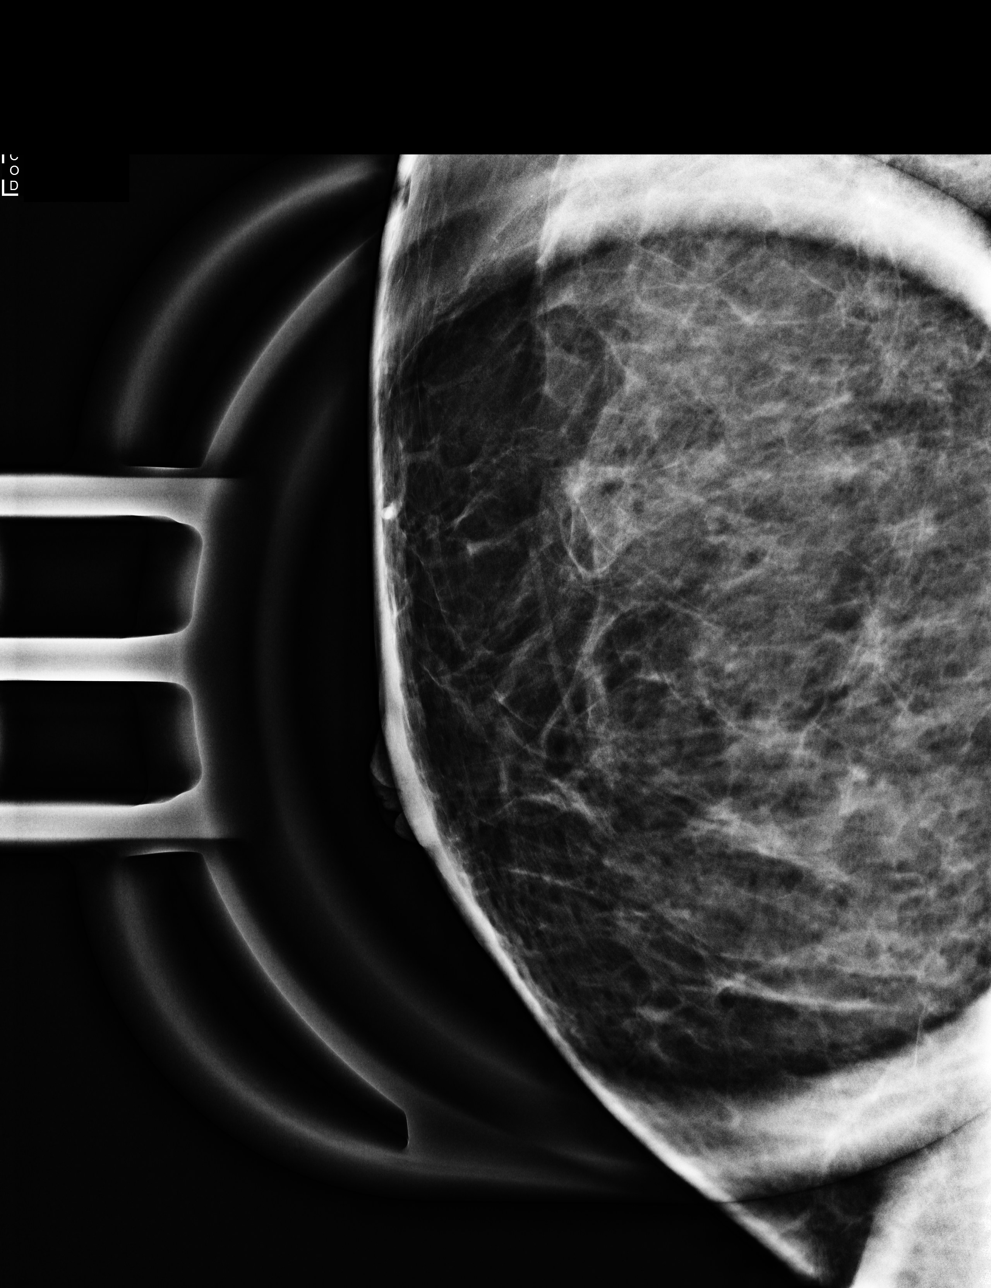

[R CC]
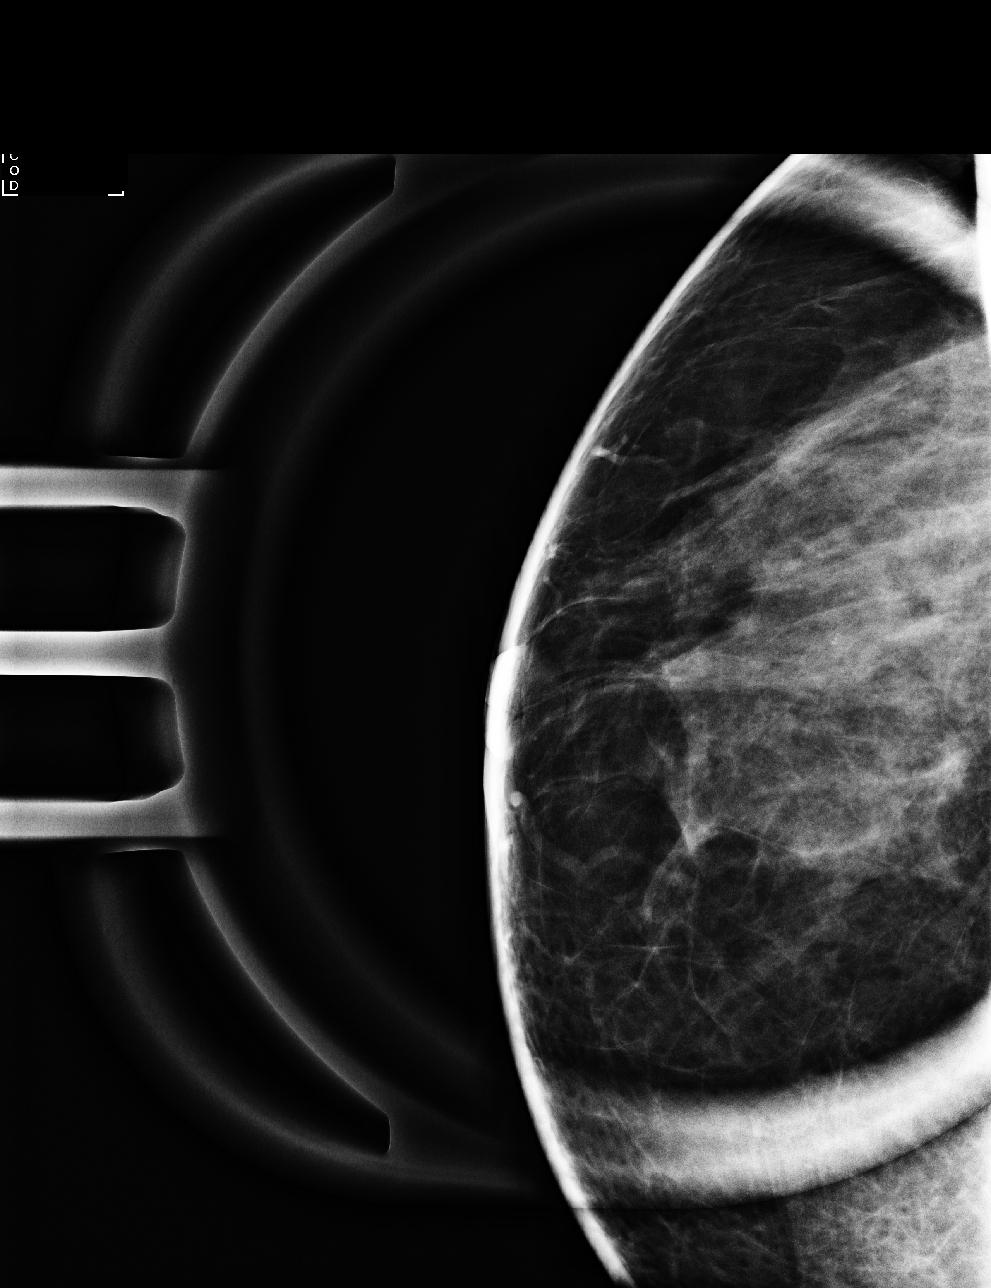

[R ML (2 of 3)]
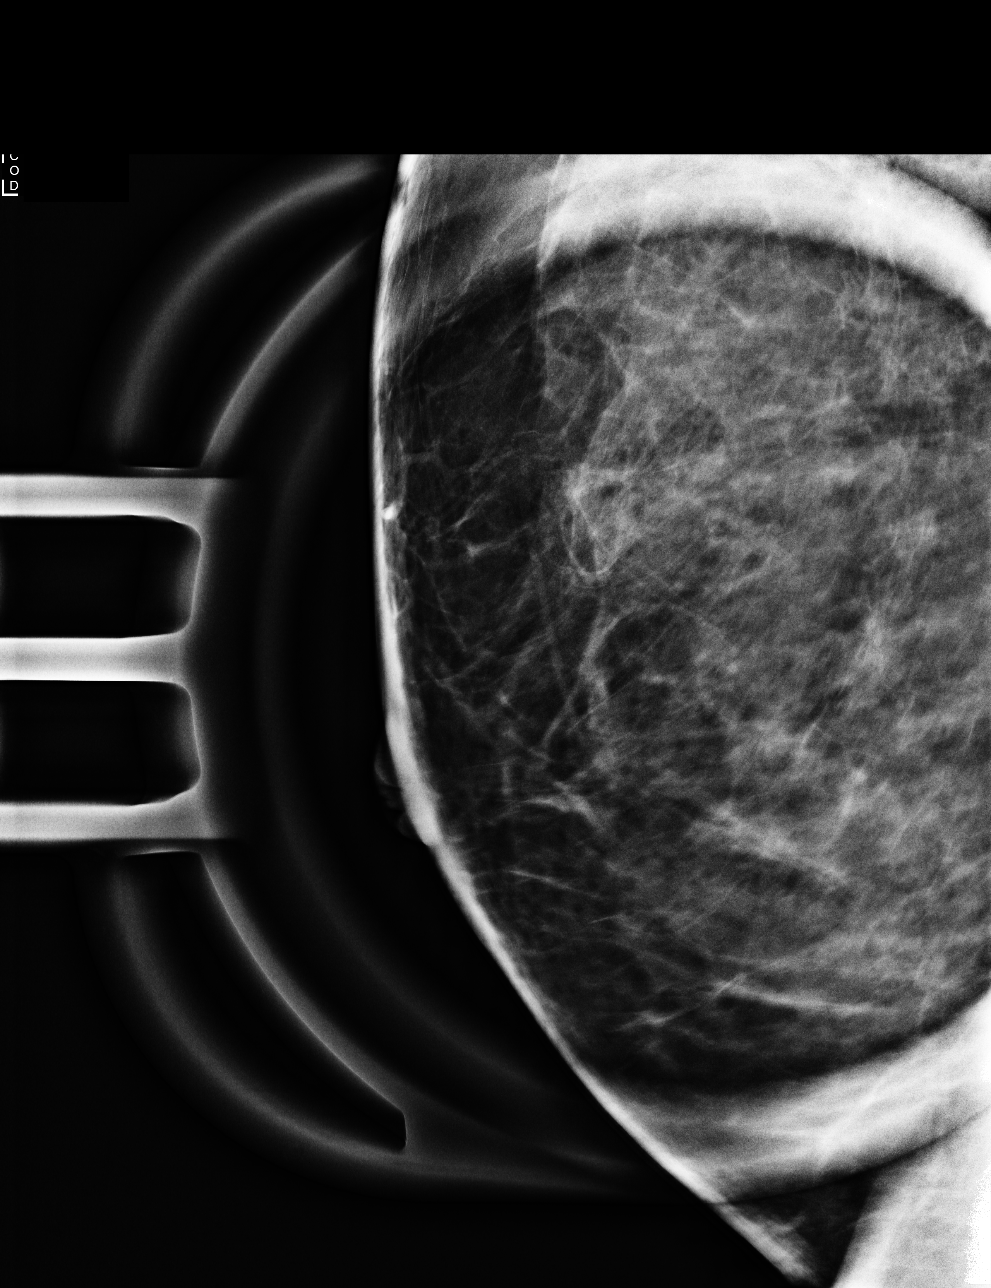

[R ML (3 of 3)]
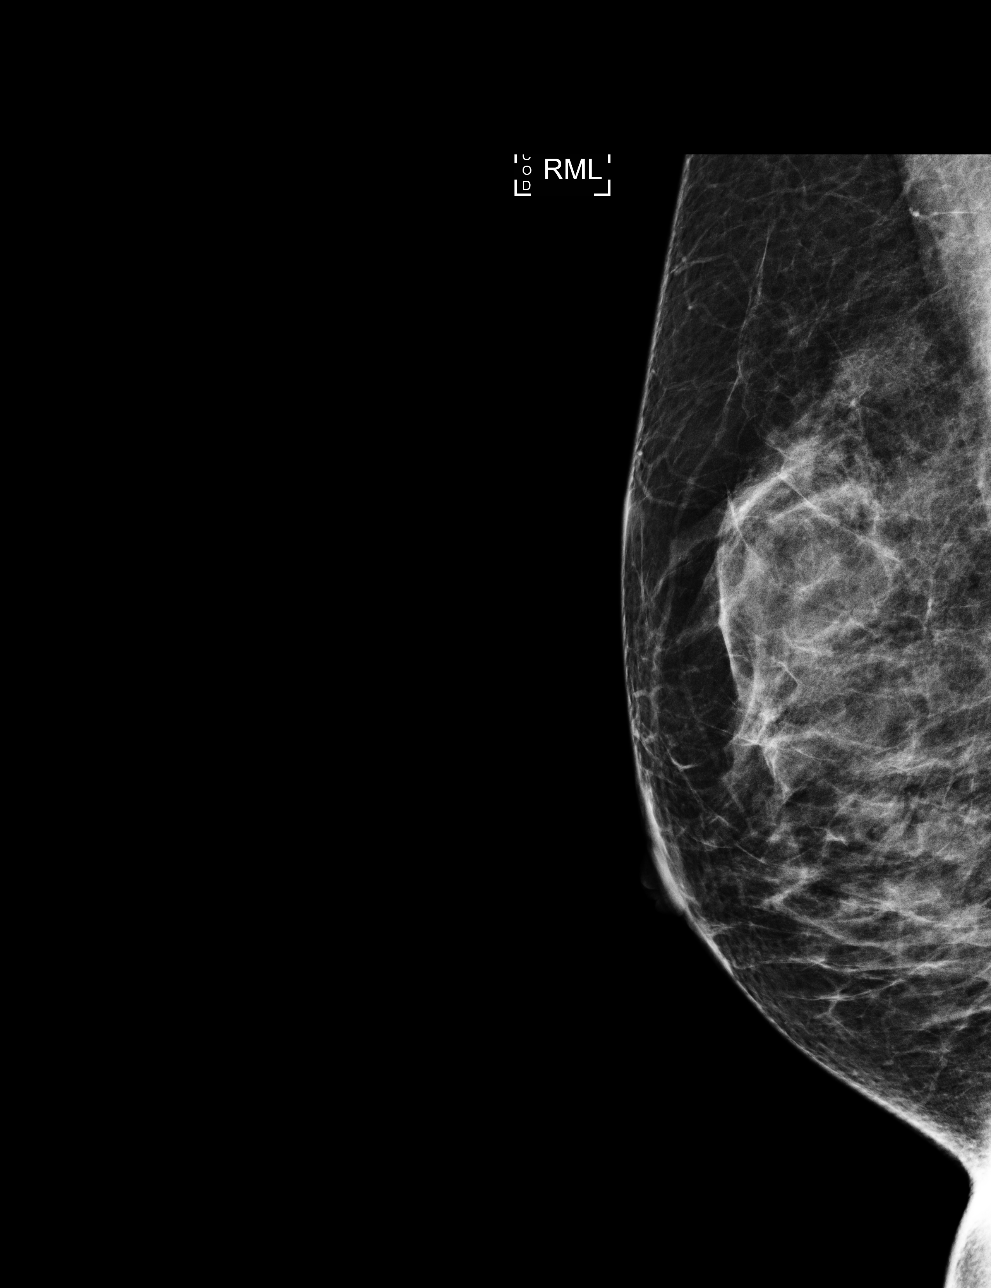

[4 of 4 positions shown; findings below may reference images not displayed]

ACR Breast Density Category c: The breast tissue is heterogeneously
dense, which may obscure small masses.
FINDINGS: There is a small group of calcifications in the upper outer,
central, right breast spanning 2 mm, rounded in configuration, with
no linearity or branching. There is no associated mass or
distortion. There are less calcifications currently than there were
on the 1517 exam.

No other abnormalities.
IMPRESSION: Benign right breast calcifications.  No evidence of malignancy.

RECOMMENDATION:
Screening mammogram in one year.(Code:[id number])

I have discussed the findings and recommendations with the patient.
Results were also provided in writing at the conclusion of the
visit. If applicable, a reminder letter will be sent to the patient
regarding the next appointment.

BI-RADS CATEGORY  2: Benign.

## 2018-02-26 MED ORDER — VITAMIN D (ERGOCALCIFEROL) 1.25 MG (50000 UNIT) PO CAPS: 50000.0000 [IU] | ORAL_CAPSULE | ORAL | 5 refills | Status: AC

## 2018-03-01 ENCOUNTER — Encounter: Payer: Self-pay | Admitting: Family Medicine

## 2018-03-02 ENCOUNTER — Encounter: Payer: Self-pay | Admitting: Family Medicine

## 2018-03-02 ENCOUNTER — Other Ambulatory Visit (HOSPITAL_COMMUNITY)
Admission: RE | Admit: 2018-03-02 | Discharge: 2018-03-02 | Disposition: A | Discharge status: Home / Self Care | Payer: BLUE CROSS/BLUE SHIELD | Source: Ambulatory Visit | Attending: Family Medicine | Admitting: Family Medicine

## 2018-03-02 ENCOUNTER — Ambulatory Visit (INDEPENDENT_AMBULATORY_CARE_PROVIDER_SITE_OTHER): Payer: BLUE CROSS/BLUE SHIELD | Admitting: Family Medicine

## 2018-03-02 VITALS — BP 120/78 | HR 68 | Temp 98.0°F | Resp 18 | Wt 123.4 lb

## 2018-03-02 DIAGNOSIS — E782 Mixed hyperlipidemia: Secondary | ICD-10-CM | POA: Diagnosis not present

## 2018-03-02 DIAGNOSIS — I10 Essential (primary) hypertension: Secondary | ICD-10-CM | POA: Diagnosis not present

## 2018-03-02 DIAGNOSIS — N760 Acute vaginitis: Secondary | ICD-10-CM

## 2018-03-02 DIAGNOSIS — R739 Hyperglycemia, unspecified: Secondary | ICD-10-CM

## 2018-03-02 DIAGNOSIS — Z124 Encounter for screening for malignant neoplasm of cervix: Secondary | ICD-10-CM

## 2018-03-02 DIAGNOSIS — Z0000 Encounter for general adult medical examination without abnormal findings: Secondary | ICD-10-CM

## 2018-03-02 DIAGNOSIS — E559 Vitamin D deficiency, unspecified: Secondary | ICD-10-CM | POA: Diagnosis not present

## 2018-03-02 DIAGNOSIS — M109 Gout, unspecified: Secondary | ICD-10-CM

## 2018-03-02 MED ORDER — LEVALBUTEROL TARTRATE 45 MCG/ACT IN AERO: 1.0000 | INHALATION_SPRAY | Freq: Four times a day (QID) | RESPIRATORY_TRACT | 5 refills | Status: DC | PRN

## 2018-03-02 MED ORDER — SUMATRIPTAN SUCCINATE 50 MG PO TABS: 50.0000 mg | ORAL_TABLET | ORAL | 1 refills | Status: DC | PRN

## 2018-03-02 NOTE — Assessment & Plan Note (Addendum)
Pap today, no concerns on exam. She has noted a slightly fishy vaginal odor recently will check BC and yeast

## 2018-03-02 NOTE — Assessment & Plan Note (Signed)
No recent flares 

## 2018-03-02 NOTE — Progress Notes (Signed)
Subjective:    Patient ID: Gail Levine, female    DOB: 1965/09/22, 53 y.o.   MRN: [id number]  No chief complaint on file.   HPI  Patient is in today for her annual physical and wellness appointment. She is doing well and has no new complaints. Pt spoke of previous experience with breast cancer and that trauma in her life, but has been in remission for over 10 years and is doing well.  Patient Care Team: Mosie Lukes, MD as PCP - General (Family Medicine)   Past Medical History:  Diagnosis Date  . Acute bronchitis 09/09/2015  . Allergy   . Arthritis   . Asthma   . Asthma 09/09/2015  . Back pain 01/08/2016  . Breast cancer, left breast ([place])    2007   . Cancer ([place])    2007  . Cervical cancer screening 01/01/2015  . Constipation 11/23/2015  . Dermatitis 01/04/2015  . Estrogen deficient vulvovaginitis 09/14/2014  . Gout   . Gout 06/03/2016  . HIV infection ([place])   . HTN (hypertension), benign 11/23/2015  . Hyperlipidemia   . Positive PPD   . Preventative health care 01/20/2017  . Pruritus 09/04/2014  . Thyroid disease   . Vitamin D deficiency     Past Surgical History:  Procedure Laterality Date  . MASTECTOMY     left breast    Family History  Problem Relation Age of Onset  . Cancer Mother        cervical cancer  . Hypertension Father   . Stroke Father   . Heart disease Father   . Diabetes Father   . Pulmonary fibrosis Father   . Cancer Sister        cervical cancer    Social History   Socioeconomic History  . Marital status: Single    Spouse name: Not on file  . Number of children: Not on file  . Years of education: Not on file  . Highest education level: Not on file  Occupational History  . Occupation: patient care coordinator  Social Needs  . Financial resource strain: Not on file  . Food insecurity:    Worry: Not on file    Inability: Not on file  . Transportation needs:    Medical: Not on file    Non-medical: Not on file  Tobacco Use  . Smoking  status: Never Smoker  . Smokeless tobacco: Never Used  Substance and Sexual Activity  . Alcohol use: Yes    Alcohol/week: 0.0 standard drinks  . Drug use: No  . Sexual activity: Never  Lifestyle  . Physical activity:    Days per week: Not on file    Minutes per session: Not on file  . Stress: Not on file  Relationships  . Social connections:    Talks on phone: Not on file    Gets together: Not on file    Attends religious service: Not on file    Active member of club or organization: Not on file    Attends meetings of clubs or organizations: Not on file    Relationship status: Not on file  . Intimate partner violence:    Fear of current or ex partner: Not on file    Emotionally abused: Not on file    Physically abused: Not on file    Forced sexual activity: Not on file  Other Topics Concern  . Not on file  Social History Narrative  . Not on file  Outpatient Medications Prior to Visit  Medication Sig Dispense Refill  . allopurinol (ZYLOPRIM) 100 MG tablet TAKE 1 TABLET (100 MG TOTAL) BY MOUTH DAILY. 30 tablet 2  . Ascorbic Acid (VITAMIN C) 1000 MG tablet Take 1,000 mg by mouth daily.    Marland Kitchen aspirin EC 81 MG tablet Take 1 tablet (81 mg total) by mouth daily.    Marland Kitchen b complex vitamins tablet Take 1 tablet by mouth daily.    . calcium carbonate (OS-CAL) 600 MG TABS tablet Take 1,800 mg by mouth 2 (two) times daily with a meal.    . diltiazem (CARDIZEM) 30 MG tablet Take 1 tablet (30 mg total) by mouth 3 (three) times daily.    . diphenhydrAMINE (BENADRYL) 25 MG tablet Take 25 mg by mouth every 6 (six) hours as needed for itching.     . hydrochlorothiazide (HYDRODIURIL) 25 MG tablet Take 0.5 tablets (12.5 mg total) by mouth daily as needed. 1/2 tablet by mouth every day    . Multiple Vitamin (MULTIVITAMIN) tablet Take 1 tablet by mouth daily.    . Vitamin D, Ergocalciferol, (DRISDOL) 1.25 MG (50000 UT) CAPS capsule Take 1 capsule (50,000 Units total) by mouth every 7 (seven) days.  4 capsule 5  . levalbuterol (XOPENEX HFA) 45 MCG/ACT inhaler Inhale 1 puff into the lungs every 6 (six) hours as needed for wheezing or shortness of breath. Fill as daw. They do have permission to fill either but go ahead and rewrite. 1 Inhaler 5  . SUMAtriptan (IMITREX) 50 MG tablet Take 1 tablet (50 mg total) by mouth every 2 (two) hours as needed for migraine. May repeat in 2 hours if headache persists or recurs. 10 tablet 1  . Beclomethasone Diprop HFA (QVAR REDIHALER) 40 MCG/ACT AERB Inhale 1 puff into the lungs daily. 1 Inhaler 3  . cetirizine (ZYRTEC) 10 MG tablet Take 10 mg by mouth daily.     No facility-administered medications prior to visit.     Allergies  Allergen Reactions  . Sulfa Antibiotics Anaphylaxis  . Penicillins Rash    Review of Systems  Constitutional: Negative for chills, fever, malaise/fatigue and weight loss.  HENT: Negative for ear pain.   Eyes: Negative for pain.  Respiratory: Negative for shortness of breath.   Cardiovascular: Positive for palpitations (sees cardiologist for palpitations). Negative for chest pain and orthopnea.  Gastrointestinal: Positive for constipation (some constipation with diltiazem). Negative for abdominal pain and diarrhea.       Objective:     Neeta Rohrman appears her stated age, WDWN, and in no acute distress.  Physical Exam Constitutional:      Appearance: Normal appearance.  HENT:     Head: Normocephalic and atraumatic.     Nose: Nose normal.  Neck:     Musculoskeletal: Neck supple.  Cardiovascular:     Rate and Rhythm: Normal rate and regular rhythm.     Heart sounds: Normal heart sounds.  Pulmonary:     Effort: Pulmonary effort is normal.     Breath sounds: Normal breath sounds.  Abdominal:     General: Bowel sounds are normal.  Neurological:     Mental Status: She is alert and oriented to person, place, and time.  Psychiatric:        Mood and Affect: Mood normal.        Behavior: Behavior normal.     BP  120/78 (BP Location: Left Arm, Patient Position: Sitting, Cuff Size: Normal)   Pulse 68   Temp  98 F (36.7 C) (Oral)   Resp 18   Wt 56 kg   SpO2 98%   BMI 24.10 kg/m  Wt Readings from Last 3 Encounters:  03/02/18 56 kg  08/11/17 54.4 kg  05/05/17 57.6 kg   BP Readings from Last 3 Encounters:  03/02/18 120/78  08/11/17 98/70  05/05/17 118/62     Immunization History  Administered Date(s) Administered  . Influenza, Seasonal, Injecte, Preservative Fre 12/26/2006  . Tdap 10/28/2016    Health Maintenance  Topic Date Due  . HIV Screening  12/03/1982  . INFLUENZA VACCINE  08/31/2017  . COLONOSCOPY  12/02/2017  . PAP SMEAR-Modifier  12/31/2017  . MAMMOGRAM  02/10/2019  . TETANUS/TDAP  10/29/2026    Lab Results  Component Value Date   WBC 3.8 (L) 02/23/2018   HGB 13.5 02/23/2018   HCT 41.2 02/23/2018   PLT 201.0 02/23/2018   GLUCOSE 76 02/23/2018   CHOL 197 02/23/2018   TRIG 61.0 02/23/2018   HDL 67.90 02/23/2018   LDLCALC 117 (H) 02/23/2018   ALT 9 02/23/2018   AST 19 02/23/2018   NA 141 02/23/2018   K 4.1 02/23/2018   CL 102 02/23/2018   CREATININE 0.69 02/23/2018   BUN 11 02/23/2018   CO2 29 02/23/2018   TSH 4.37 02/23/2018   HGBA1C 5.0 02/23/2018    Lab Results  Component Value Date   TSH 4.37 02/23/2018   Lab Results  Component Value Date   WBC 3.8 (L) 02/23/2018   HGB 13.5 02/23/2018   HCT 41.2 02/23/2018   MCV 93.9 02/23/2018   PLT 201.0 02/23/2018   Lab Results  Component Value Date   NA 141 02/23/2018   K 4.1 02/23/2018   CHLORIDE 104 07/01/2016   CO2 29 02/23/2018   GLUCOSE 76 02/23/2018   BUN 11 02/23/2018   CREATININE 0.69 02/23/2018   BILITOT 0.8 02/23/2018   ALKPHOS 61 02/23/2018   AST 19 02/23/2018   ALT 9 02/23/2018   PROT 7.6 02/23/2018   ALBUMIN 4.2 02/23/2018   CALCIUM 9.3 02/23/2018   ANIONGAP 7 07/01/2016   EGFR 90 (L) 07/01/2016   GFR 89.97 02/23/2018   Lab Results  Component Value Date   CHOL 197 02/23/2018    Lab Results  Component Value Date   HDL 67.90 02/23/2018   Lab Results  Component Value Date   LDLCALC 117 (H) 02/23/2018   Lab Results  Component Value Date   TRIG 61.0 02/23/2018   Lab Results  Component Value Date   CHOLHDL 3 02/23/2018   Lab Results  Component Value Date   HGBA1C 5.0 02/23/2018         Assessment & Plan:   Problems Addressed this Visit  Preventative Medicine: pt needs regular screening PAP smear. She is doing well overall. Going to [facility] cardiologist soon for f/u of palpitations and chest pain. She is thinking about colonoscopy and Shingrix vaccine though these have been encouraged by physician. Refuses influenza vaccine - orders: pelvic exam with PAP smear  HTN: Well controlled. Pt states she usually likes her BP to be 100/65 so she thinks it is high for her today at 120/78, but overall it is acceptable. She works hard with diet and exercise to keep it down.  Vitamin D deficiency: Pt's most recent labs showed continued vitamin D deficiency.  - orders: Vit D level  Hyperlipidemia: Most recent labs showed high levels of LDL. Encouraged heart healthy diet and increased exercise as tolerated.  Gout: No recent flare ups, well controlled.  Acute Vaginitis: pt has been experiencing a foul smell coming from vaginal area. No suspicious discharge was seen upon pelvic exam.  I have discontinued Shiva Simi's cetirizine and beclomethasone. I am also having her maintain her multivitamin, vitamin C, calcium carbonate, b complex vitamins, diphenhydrAMINE, diltiazem, aspirin EC, hydrochlorothiazide, allopurinol, Vitamin D (Ergocalciferol), SUMAtriptan, and levalbuterol.  Meds ordered this encounter  Medications  . SUMAtriptan (IMITREX) 50 MG tablet    Sig: Take 1 tablet (50 mg total) by mouth every 2 (two) hours as needed for migraine. May repeat in 2 hours if headache persists or recurs.    Dispense:  10 tablet    Refill:  1  . levalbuterol (XOPENEX HFA) 45  MCG/ACT inhaler    Sig: Inhale 1 puff into the lungs every 6 (six) hours as needed for wheezing or shortness of breath. Fill as daw. They do have permission to fill either but go ahead and rewrite.    Dispense:  1 Inhaler    Refill:  5    Court Joy, Student-PA

## 2018-03-02 NOTE — Assessment & Plan Note (Signed)
Well controlled, no changes to meds. Encouraged heart healthy diet such as the DASH diet and exercise as tolerated.  °

## 2018-03-02 NOTE — Assessment & Plan Note (Signed)
hgba1c acceptable, minimize simple carbs. Increase exercise as tolerated. Continue current meds 

## 2018-03-02 NOTE — Patient Instructions (Addendum)
Consider Colonoscopy let us know if you are ready to do it let us know and we will refer to [place] [facility], Female Preventive care refers to lifestyle choices and visits with your health care provider that can promote health and wellness. What does preventive care include?   A yearly physical exam. This is also called an annual well check.  Dental exams once or twice a year.  Routine eye exams. Ask your health care provider how often you should have your eyes checked.  Personal lifestyle choices, including: ? Daily care of your teeth and gums. ? Regular physical activity. ? Eating a healthy diet. ? Avoiding tobacco and drug use. ? Limiting alcohol use. ? Practicing safe sex. ? Taking low-dose aspirin daily starting at age 53. ? Taking vitamin and mineral supplements as recommended by your health care provider. What happens during an annual well check? The services and screenings done by your health care provider during your annual well check will depend on your age, overall health, lifestyle risk factors, and family history of disease. Counseling Your health care provider may ask you questions about your:  Alcohol use.  Tobacco use.  Drug use.  Emotional well-being.  Home and relationship well-being.  Sexual activity.  Eating habits.  Work and work Statistician.  Method of birth control.  Menstrual cycle.  Pregnancy history. Screening You may have the following tests or measurements:  Height, weight, and BMI.  Blood pressure.  Lipid and cholesterol levels. These may be checked every 5 years, or more frequently if you are over 32 years old.  Skin check.  Lung cancer screening. You may have this screening every year starting at age 58 if you have a 30-pack-year history of smoking and currently smoke or have quit within the past 15 years.  Colorectal cancer screening. All adults should have this  screening starting at age 94 and continuing until age 30. Your health care provider may recommend screening at age 60. You will have tests every 1-10 years, depending on your results and the type of screening test. People at increased risk should start screening at an earlier age. Screening tests may include: ? Guaiac-based fecal occult blood testing. ? Fecal immunochemical test (FIT). ? Stool DNA test. ? Virtual colonoscopy. ? Sigmoidoscopy. During this test, a flexible tube with a tiny camera (sigmoidoscope) is used to examine your rectum and lower colon. The sigmoidoscope is inserted through your anus into your rectum and lower colon. ? Colonoscopy. During this test, a long, thin, flexible tube with a tiny camera (colonoscope) is used to examine your entire colon and rectum.  Hepatitis C blood test.  Hepatitis B blood test.  Sexually transmitted disease (STD) testing.  Diabetes screening. This is done by checking your blood sugar (glucose) after you have not eaten for a while (fasting). You may have this done every 1-3 years.  Mammogram. This may be done every 1-2 years. Talk to your health care provider about when you should start having regular mammograms. This may depend on whether you have a family history of breast cancer.  BRCA-related cancer screening. This may be done if you have a family history of breast, ovarian, tubal, or peritoneal cancers.  Pelvic exam and Pap test. This may be done every 3 years starting at age 68. Starting at age 40, this may be done every 5 years if you have a Pap test in combination with an HPV test.  Bone density scan. This is done to screen for osteoporosis. You may have this scan if you are at high risk for osteoporosis. Discuss your test results, treatment options, and if necessary, the need for more tests with your health care provider. Vaccines Your health care provider may recommend certain vaccines, such as:  Influenza vaccine. This is  recommended every year.  Tetanus, diphtheria, and acellular pertussis (Tdap, Td) vaccine. You may need a Td booster every 10 years.  Varicella vaccine. You may need this if you have not been vaccinated.  Zoster vaccine. You may need this after age 23.  Measles, mumps, and rubella (MMR) vaccine. You may need at least one dose of MMR if you were born in 1957 or later. You may also need a second dose.  Pneumococcal 13-valent conjugate (PCV13) vaccine. You may need this if you have certain conditions and were not previously vaccinated.  Pneumococcal polysaccharide (PPSV23) vaccine. You may need one or two doses if you smoke cigarettes or if you have certain conditions.  Meningococcal vaccine. You may need this if you have certain conditions.  Hepatitis A vaccine. You may need this if you have certain conditions or if you travel or work in places where you may be exposed to hepatitis A.  Hepatitis B vaccine. You may need this if you have certain conditions or if you travel or work in places where you may be exposed to hepatitis B.  Haemophilus influenzae type b (Hib) vaccine. You may need this if you have certain conditions. Talk to your health care provider about which screenings and vaccines you need and how often you need them. This information is not intended to replace advice given to you by your health care provider. Make sure you discuss any questions you have with your health care provider. Document Released: 02/13/2015 Document Revised: 03/09/2017 Document Reviewed: 11/18/2014 Elsevier Interactive Patient Education  2019 [employer].

## 2018-03-03 NOTE — Progress Notes (Signed)
Subjective:    Patient ID: Gail Levine, female    DOB: September 26, 1965, 53 y.o.   MRN: [id number]  No chief complaint on file.   HPI Patient is in today for annual preventative exam and pap smear. She has noted some vaginal odor this past week. No significant discharge and she is not sexually active. No recent febrile illness or hospitalizations. Is trying to stay active and maintain a heart healthy diet. She notes her blood pressure has been well controlled. Denies CP/palp/SOB/HA/congestion/fevers/GI or GU c/o. Taking meds as prescribed  Past Medical History:  Diagnosis Date  . Acute bronchitis 09/09/2015  . Allergy   . Arthritis   . Asthma   . Asthma 09/09/2015  . Back pain 01/08/2016  . Breast cancer, left breast ([place])    2007   . Cancer ([place])    2007  . Cervical cancer screening 01/01/2015  . Constipation 11/23/2015  . Dermatitis 01/04/2015  . Estrogen deficient vulvovaginitis 09/14/2014  . Gout   . Gout 06/03/2016  . HIV infection ([place])   . HTN (hypertension), benign 11/23/2015  . Hyperlipidemia   . Positive PPD   . Preventative health care 01/20/2017  . Pruritus 09/04/2014  . Thyroid disease   . Vitamin D deficiency     Past Surgical History:  Procedure Laterality Date  . MASTECTOMY     left breast    Family History  Problem Relation Age of Onset  . Cancer Mother        cervical cancer  . Hypertension Father   . Stroke Father   . Heart disease Father   . Diabetes Father   . Pulmonary fibrosis Father   . Cancer Sister        cervical cancer    Social History   Socioeconomic History  . Marital status: Single    Spouse name: Not on file  . Number of children: Not on file  . Years of education: Not on file  . Highest education level: Not on file  Occupational History  . Occupation: patient care coordinator  Social Needs  . Financial resource strain: Not on file  . Food insecurity:    Worry: Not on file    Inability: Not on file  . Transportation needs:   Medical: Not on file    Non-medical: Not on file  Tobacco Use  . Smoking status: Never Smoker  . Smokeless tobacco: Never Used  Substance and Sexual Activity  . Alcohol use: Yes    Alcohol/week: 0.0 standard drinks  . Drug use: No  . Sexual activity: Never  Lifestyle  . Physical activity:    Days per week: Not on file    Minutes per session: Not on file  . Stress: Not on file  Relationships  . Social connections:    Talks on phone: Not on file    Gets together: Not on file    Attends religious service: Not on file    Active member of club or organization: Not on file    Attends meetings of clubs or organizations: Not on file    Relationship status: Not on file  . Intimate partner violence:    Fear of current or ex partner: Not on file    Emotionally abused: Not on file    Physically abused: Not on file    Forced sexual activity: Not on file  Other Topics Concern  . Not on file  Social History Narrative  . Not on file  Outpatient Medications Prior to Visit  Medication Sig Dispense Refill  . allopurinol (ZYLOPRIM) 100 MG tablet TAKE 1 TABLET (100 MG TOTAL) BY MOUTH DAILY. 30 tablet 2  . Ascorbic Acid (VITAMIN C) 1000 MG tablet Take 1,000 mg by mouth daily.    Marland Kitchen aspirin EC 81 MG tablet Take 1 tablet (81 mg total) by mouth daily.    Marland Kitchen b complex vitamins tablet Take 1 tablet by mouth daily.    . calcium carbonate (OS-CAL) 600 MG TABS tablet Take 1,800 mg by mouth 2 (two) times daily with a meal.    . diltiazem (CARDIZEM) 30 MG tablet Take 1 tablet (30 mg total) by mouth 3 (three) times daily.    . diphenhydrAMINE (BENADRYL) 25 MG tablet Take 25 mg by mouth every 6 (six) hours as needed for itching.     . hydrochlorothiazide (HYDRODIURIL) 25 MG tablet Take 0.5 tablets (12.5 mg total) by mouth daily as needed. 1/2 tablet by mouth every day    . Multiple Vitamin (MULTIVITAMIN) tablet Take 1 tablet by mouth daily.    . Vitamin D, Ergocalciferol, (DRISDOL) 1.25 MG (50000 UT) CAPS  capsule Take 1 capsule (50,000 Units total) by mouth every 7 (seven) days. 4 capsule 5  . levalbuterol (XOPENEX HFA) 45 MCG/ACT inhaler Inhale 1 puff into the lungs every 6 (six) hours as needed for wheezing or shortness of breath. Fill as daw. They do have permission to fill either but go ahead and rewrite. 1 Inhaler 5  . SUMAtriptan (IMITREX) 50 MG tablet Take 1 tablet (50 mg total) by mouth every 2 (two) hours as needed for migraine. May repeat in 2 hours if headache persists or recurs. 10 tablet 1  . Beclomethasone Diprop HFA (QVAR REDIHALER) 40 MCG/ACT AERB Inhale 1 puff into the lungs daily. 1 Inhaler 3  . cetirizine (ZYRTEC) 10 MG tablet Take 10 mg by mouth daily.     No facility-administered medications prior to visit.     Allergies  Allergen Reactions  . Sulfa Antibiotics Anaphylaxis  . Penicillins Rash    Review of Systems  Constitutional: Negative for chills, fever and malaise/fatigue.  HENT: Negative for congestion and hearing loss.   Eyes: Negative for discharge.  Respiratory: Negative for cough, sputum production and shortness of breath.   Cardiovascular: Negative for chest pain, palpitations and leg swelling.  Gastrointestinal: Negative for abdominal pain, blood in stool, constipation, diarrhea, heartburn, nausea and vomiting.  Genitourinary: Negative for dysuria, frequency, hematuria and urgency.  Musculoskeletal: Negative for back pain, falls and myalgias.  Skin: Negative for rash.  Neurological: Negative for dizziness, sensory change, loss of consciousness, weakness and headaches.  Endo/Heme/Allergies: Negative for environmental allergies. Does not bruise/bleed easily.  Psychiatric/Behavioral: Negative for depression and suicidal ideas. The patient is not nervous/anxious and does not have insomnia.        Objective:    Physical Exam Constitutional:      General: She is not in acute distress.    Appearance: She is not diaphoretic.  HENT:     Head: Normocephalic  and atraumatic.     Right Ear: External ear normal.     Left Ear: External ear normal.     Nose: Nose normal.     Mouth/Throat:     Pharynx: No oropharyngeal exudate.  Eyes:     General: No scleral icterus.       Right eye: No discharge.        Left eye: No discharge.  Conjunctiva/sclera: Conjunctivae normal.     Pupils: Pupils are equal, round, and reactive to light.  Neck:     Musculoskeletal: Normal range of motion and neck supple.     Thyroid: No thyromegaly.  Cardiovascular:     Rate and Rhythm: Normal rate and regular rhythm.     Heart sounds: Normal heart sounds. No murmur.  Pulmonary:     Effort: Pulmonary effort is normal. No respiratory distress.     Breath sounds: Normal breath sounds. No wheezing or rales.  Abdominal:     General: Bowel sounds are normal. There is no distension.     Palpations: Abdomen is soft. There is no mass.     Tenderness: There is no abdominal tenderness.  Musculoskeletal: Normal range of motion.        General: No tenderness.  Lymphadenopathy:     Cervical: No cervical adenopathy.  Skin:    General: Skin is warm and dry.     Findings: No rash.  Neurological:     Mental Status: She is alert and oriented to person, place, and time.     Cranial Nerves: No cranial nerve deficit.     Coordination: Coordination normal.     Deep Tendon Reflexes: Reflexes are normal and symmetric. Reflexes normal.     BP 120/78 (BP Location: Left Arm, Patient Position: Sitting, Cuff Size: Normal)   Pulse 68   Temp 98 F (36.7 C) (Oral)   Resp 18   Wt 123 lb 6.4 oz (56 kg)   SpO2 98%   BMI 24.10 kg/m  Wt Readings from Last 3 Encounters:  03/02/18 123 lb 6.4 oz (56 kg)  08/11/17 120 lb (54.4 kg)  05/05/17 127 lb (57.6 kg)     Lab Results  Component Value Date   WBC 3.8 (L) 02/23/2018   HGB 13.5 02/23/2018   HCT 41.2 02/23/2018   PLT 201.0 02/23/2018   GLUCOSE 76 02/23/2018   CHOL 197 02/23/2018   TRIG 61.0 02/23/2018   HDL 67.90 02/23/2018     LDLCALC 117 (H) 02/23/2018   ALT 9 02/23/2018   AST 19 02/23/2018   NA 141 02/23/2018   K 4.1 02/23/2018   CL 102 02/23/2018   CREATININE 0.69 02/23/2018   BUN 11 02/23/2018   CO2 29 02/23/2018   TSH 4.37 02/23/2018   HGBA1C 5.0 02/23/2018    Lab Results  Component Value Date   TSH 4.37 02/23/2018   Lab Results  Component Value Date   WBC 3.8 (L) 02/23/2018   HGB 13.5 02/23/2018   HCT 41.2 02/23/2018   MCV 93.9 02/23/2018   PLT 201.0 02/23/2018   Lab Results  Component Value Date   NA 141 02/23/2018   K 4.1 02/23/2018   CHLORIDE 104 07/01/2016   CO2 29 02/23/2018   GLUCOSE 76 02/23/2018   BUN 11 02/23/2018   CREATININE 0.69 02/23/2018   BILITOT 0.8 02/23/2018   ALKPHOS 61 02/23/2018   AST 19 02/23/2018   ALT 9 02/23/2018   PROT 7.6 02/23/2018   ALBUMIN 4.2 02/23/2018   CALCIUM 9.3 02/23/2018   ANIONGAP 7 07/01/2016   EGFR 90 (L) 07/01/2016   GFR 89.97 02/23/2018   Lab Results  Component Value Date   CHOL 197 02/23/2018   Lab Results  Component Value Date   HDL 67.90 02/23/2018   Lab Results  Component Value Date   LDLCALC 117 (H) 02/23/2018   Lab Results  Component Value Date   TRIG 61.0 02/23/2018  Lab Results  Component Value Date   CHOLHDL 3 02/23/2018   Lab Results  Component Value Date   HGBA1C 5.0 02/23/2018       Assessment & Plan:   Problem List Items Addressed This Visit    Vitamin D deficiency    Supplement and monitor      Relevant Orders   VITAMIN D 25 Hydroxy (Vit-D Deficiency, Fractures)   Hyperlipidemia    Encouraged heart healthy diet, increase exercise, avoid trans fats, consider a krill oil cap daily      Cervical cancer screening    Pap today, no concerns on exam. She has noted a slightly fishy vaginal odor recently will check BC and yeast      Relevant Orders   Cytology - PAP( [facility])   HTN (hypertension), benign - Primary    Well controlled, no changes to meds. Encouraged heart healthy diet such  as the DASH diet and exercise as tolerated.       Gout    No recent flares      Preventative health care    Patient encouraged to maintain heart healthy diet, regular exercise, adequate sleep. Consider daily probiotics. Take medications as prescribed      Hyperglycemia    hgba1c acceptable, minimize simple carbs. Increase exercise as tolerated. Continue current meds       Other Visit Diagnoses    Acute vaginitis       Relevant Orders   Cytology - PAP( )      I have discontinued Duyen Mondesir's cetirizine and beclomethasone. I am also having her maintain her multivitamin, vitamin C, calcium carbonate, b complex vitamins, diphenhydrAMINE, diltiazem, aspirin EC, hydrochlorothiazide, allopurinol, Vitamin D (Ergocalciferol), SUMAtriptan, and levalbuterol.  Meds ordered this encounter  Medications  . SUMAtriptan (IMITREX) 50 MG tablet    Sig: Take 1 tablet (50 mg total) by mouth every 2 (two) hours as needed for migraine. May repeat in 2 hours if headache persists or recurs.    Dispense:  10 tablet    Refill:  1  . levalbuterol (XOPENEX HFA) 45 MCG/ACT inhaler    Sig: Inhale 1 puff into the lungs every 6 (six) hours as needed for wheezing or shortness of breath. Fill as daw. They do have permission to fill either but go ahead and rewrite.    Dispense:  1 Inhaler    Refill:  5     Penni Homans, MD

## 2018-03-03 NOTE — Assessment & Plan Note (Signed)
Supplement and monitor 

## 2018-03-03 NOTE — Assessment & Plan Note (Signed)
Patient encouraged to maintain heart healthy diet, regular exercise, adequate sleep. Consider daily probiotics. Take medications as prescribed 

## 2018-03-03 NOTE — Assessment & Plan Note (Signed)
Encouraged heart healthy diet, increase exercise, avoid trans fats, consider a krill oil cap daily 

## 2018-03-05 ENCOUNTER — Encounter: Payer: Self-pay | Admitting: Family Medicine

## 2018-03-05 LAB — CYTOLOGY - PAP
Adequacy: ABSENT
Bacterial vaginitis: NEGATIVE
Candida vaginitis: POSITIVE — AB
Diagnosis: NEGATIVE

## 2018-03-06 ENCOUNTER — Other Ambulatory Visit: Payer: Self-pay | Admitting: Family Medicine

## 2018-03-06 MED ORDER — FLUCONAZOLE 150 MG PO TABS: 150.0000 mg | ORAL_TABLET | ORAL | 0 refills | Status: DC

## 2018-03-06 NOTE — Addendum Note (Signed)
Addended by: Magdalene Molly A on: 03/06/2018 07:22 AM   Modules accepted: Orders

## 2018-04-06 DIAGNOSIS — I10 Essential (primary) hypertension: Secondary | ICD-10-CM | POA: Diagnosis not present

## 2018-04-06 DIAGNOSIS — R002 Palpitations: Secondary | ICD-10-CM | POA: Diagnosis not present

## 2018-04-06 DIAGNOSIS — R0602 Shortness of breath: Secondary | ICD-10-CM | POA: Diagnosis not present

## 2018-04-06 DIAGNOSIS — R600 Localized edema: Secondary | ICD-10-CM | POA: Diagnosis not present

## 2018-05-17 ENCOUNTER — Other Ambulatory Visit: Payer: Self-pay | Admitting: Family Medicine

## 2018-06-22 ENCOUNTER — Ambulatory Visit: Payer: BLUE CROSS/BLUE SHIELD | Admitting: Registered"

## 2018-08-10 ENCOUNTER — Telehealth: Payer: Self-pay | Admitting: Family Medicine

## 2018-08-10 NOTE — Telephone Encounter (Signed)
Needs virtual visit 

## 2018-08-10 NOTE — Telephone Encounter (Signed)
Pt called and stated that she was possibly exposed to covid-19. Family member works in health care.

## 2018-08-13 ENCOUNTER — Telehealth: Payer: Self-pay | Admitting: Medical

## 2018-08-13 ENCOUNTER — Other Ambulatory Visit: Payer: Self-pay

## 2018-08-13 ENCOUNTER — Ambulatory Visit (INDEPENDENT_AMBULATORY_CARE_PROVIDER_SITE_OTHER): Payer: BC Managed Care – PPO | Admitting: Medical

## 2018-08-13 VITALS — BP 106/70 | HR 64 | Ht 60.0 in | Wt 113.0 lb

## 2018-08-13 DIAGNOSIS — Z20828 Contact with and (suspected) exposure to other viral communicable diseases: Secondary | ICD-10-CM | POA: Diagnosis not present

## 2018-08-13 DIAGNOSIS — Z20822 Contact with and (suspected) exposure to covid-19: Secondary | ICD-10-CM

## 2018-08-13 NOTE — Telephone Encounter (Signed)
Pt has contact with sister who had contact with covid + person. As part of contact tracing. Please get her tested tomorrow at 8 am if possible. Or when she can.

## 2018-08-13 NOTE — Telephone Encounter (Signed)
Attempted to reach pt, went straight to vm but mail box is full. I placed order

## 2018-08-13 NOTE — Patient Instructions (Signed)
Pt has some potential secondary exposure to covid positive person. Yet to be determined if her asymptomatic sister who had direct contact  tests positive.  Pt asymptomatic. Will place order for her to get tested. Explained to notify her boss of current situation. 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  However, if she finds out her sister is positive for covid or if she begins to develop any symptoms then notify me immediately as in that event would have her stay at home and follow self quarantine guidelines. Pt expressed understanding.  Follow up as needed. 

## 2018-08-13 NOTE — Progress Notes (Signed)
Subjective:    Patient ID: Gail Levine, female    DOB: 1965-02-27, 53 y.o.   MRN: [id number]  HPI  Virtual Visit via Video Note  I connected with Kiyla Hoben on 08/13/18 at  3:40 PM EDT by a video enabled telemedicine application and verified that I am speaking with the correct person using two identifiers.  Location: Patient: work Provider: office.   I discussed the limitations of evaluation and management by telemedicine and the availability of in person appointments. The patient expressed understanding and agreed to proceed.     I discussed the assessment and treatment plan with the patient. The patient was provided an opportunity to ask questions and all were answered. The patient agreed with the plan and demonstrated an understanding of the instructions.   The patient was advised to call back or seek an in-person evaluation if the symptoms worsen or if the condition fails to improve as anticipated.  I provided 15 minutes of non-face-to-face time during this encounter.   Mackie Pai, PA-C    History of Present Illness:   Pt states her sister recently told she had potential covid exposure. 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 She was told get tested as precaution. Pt has no symptoms. [place] ros. Visited with her sister last week.   Pt sister has test on Friday. Results pending. Everyone in house asymptomatic.   Review of Systems  Constitutional: Negative for chills, fatigue and fever.  HENT: Negative for congestion, sinus pressure, sinus pain and trouble swallowing.        No loss of smell or change in taste.  Respiratory: Negative for cough, shortness of breath and wheezing.   Cardiovascular: Negative for chest pain and palpitations.  Gastrointestinal: Negative for abdominal pain, constipation, diarrhea and nausea.  Genitourinary: Negative for dysuria.  Musculoskeletal: Negative for back pain and myalgias.  Skin: Negative for rash.   Neurological: Negative for dizziness, numbness and headaches.  Hematological: Negative for adenopathy.  Psychiatric/Behavioral: Negative for behavioral problems and confusion.    Past Medical History:  Diagnosis Date  . Acute bronchitis 09/09/2015  . Allergy   . Arthritis   . Asthma   . Asthma 09/09/2015  . Back pain 01/08/2016  . Breast cancer, left breast ([place])    2007   . Cancer ([place])    2007  . Cervical cancer screening 01/01/2015  . Constipation 11/23/2015  . Dermatitis 01/04/2015  . Estrogen deficient vulvovaginitis 09/14/2014  . Gout   . Gout 06/03/2016  . HIV infection ([place])   . HTN (hypertension), benign 11/23/2015  . Hyperlipidemia   . Positive PPD   . Preventative health care 01/20/2017  . Pruritus 09/04/2014  . Thyroid disease   . Vitamin D deficiency      Social History   Socioeconomic History  . Marital status: Single    Spouse name: Not on file  . Number of children: Not on file  . Years of education: Not on file  . Highest education level: Not on file  Occupational History  . Occupation: patient care coordinator  Social Needs  . Financial resource strain: Not on file  . Food insecurity    Worry: Not on file    Inability: Not on file  . Transportation needs    Medical: Not on file    Non-medical: Not on file  Tobacco Use  . Smoking status: Never Smoker  . Smokeless tobacco: Never Used  Substance and Sexual Activity  .  Alcohol use: Yes    Alcohol/week: 0.0 standard drinks  . Drug use: No  . Sexual activity: Never  Lifestyle  . Physical activity    Days per week: Not on file    Minutes per session: Not on file  . Stress: Not on file  Relationships  . Social Herbalist on phone: Not on file    Gets together: Not on file    Attends religious service: Not on file    Active member of club or organization: Not on file    Attends meetings of clubs or organizations: Not on file    Relationship status: Not on file  . Intimate partner  violence    Fear of current or ex partner: Not on file    Emotionally abused: Not on file    Physically abused: Not on file    Forced sexual activity: Not on file  Other Topics Concern  . Not on file  Social History Narrative  . Not on file    Past Surgical History:  Procedure Laterality Date  . MASTECTOMY     left breast    Family History  Problem Relation Age of Onset  . Cancer Mother        cervical cancer  . Hypertension Father   . Stroke Father   . Heart disease Father   . Diabetes Father   . Pulmonary fibrosis Father   . Cancer Sister        cervical cancer    Allergies  Allergen Reactions  . Sulfa Antibiotics Anaphylaxis  . Penicillins Rash    Current Outpatient Medications on File Prior to Visit  Medication Sig Dispense Refill  . allopurinol (ZYLOPRIM) 100 MG tablet TAKE 1 TABLET BY MOUTH EVERY DAY 30 tablet 2  . Ascorbic Acid (VITAMIN C) 1000 MG tablet Take 1,000 mg by mouth daily.    Marland Kitchen aspirin EC 81 MG tablet Take 1 tablet (81 mg total) by mouth daily.    Marland Kitchen b complex vitamins tablet Take 1 tablet by mouth daily.    . calcium carbonate (OS-CAL) 600 MG TABS tablet Take 1,800 mg by mouth 2 (two) times daily with a meal.    . diltiazem (CARDIZEM) 30 MG tablet Take 1 tablet (30 mg total) by mouth 3 (three) times daily.    . diphenhydrAMINE (BENADRYL) 25 MG tablet Take 25 mg by mouth every 6 (six) hours as needed for itching.     . fluconazole (DIFLUCAN) 150 MG tablet Take 1 tablet (150 mg total) by mouth once a week. 2 tablet 0  . hydrochlorothiazide (HYDRODIURIL) 25 MG tablet Take 0.5 tablets (12.5 mg total) by mouth daily as needed. 1/2 tablet by mouth every day    . levalbuterol (XOPENEX HFA) 45 MCG/ACT inhaler Inhale 1 puff into the lungs every 6 (six) hours as needed for wheezing or shortness of breath. Fill as daw. They do have permission to fill either but go ahead and rewrite. 1 Inhaler 5  . Multiple Vitamin (MULTIVITAMIN) tablet Take 1 tablet by mouth  daily.    . SUMAtriptan (IMITREX) 50 MG tablet Take 1 tablet (50 mg total) by mouth every 2 (two) hours as needed for migraine. May repeat in 2 hours if headache persists or recurs. 10 tablet 1  . Vitamin D, Ergocalciferol, (DRISDOL) 1.25 MG (50000 UT) CAPS capsule Take 1 capsule (50,000 Units total) by mouth every 7 (seven) days. 4 capsule 5   No current facility-administered medications on file  prior to visit.     BP 106/70   Pulse 64   Ht 5' (1.524 m)   Wt 113 lb (51.3 kg)   SpO2 96%   BMI 22.07 kg/m       Objective:   Physical Exam  General-no acute distress, pleasant, oriented. Lungs- on inspection lungs appear unlabored. Neck- no tracheal deviation or jvd on inspection. Neuro- gross motor function appears intact.      Assessment & Plan:  Pt has some potential secondary exposure to covid positive person. Yet to be determined if her asymptomatic sister who had direct contact  tests positive.  Pt asymptomatic. Will place order for her to get tested. Explained to notify her boss of current situation. 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  However, if she finds out her sister is positive for covid or if she begins to develop any symptoms then notify me immediately as in that event would have her stay at home and follow self quarantine guidelines. Pt expressed understanding.  Follow up as needed.

## 2018-08-13 NOTE — Telephone Encounter (Signed)
Spoke with pt when I spoke with her sister to get her scheduled. Pt has been scheduled for the [facility] testing site on 08/14/18 at 3:30. Pt is aware to wear a mask and remain in her car. She understood and had no additional questions at this time. Nothing further is needed.

## 2018-08-14 ENCOUNTER — Other Ambulatory Visit: Payer: BC Managed Care – PPO

## 2018-08-14 DIAGNOSIS — Z20822 Contact with and (suspected) exposure to covid-19: Secondary | ICD-10-CM

## 2018-08-14 DIAGNOSIS — R6889 Other general symptoms and signs: Secondary | ICD-10-CM | POA: Diagnosis not present

## 2018-08-18 LAB — NOVEL CORONAVIRUS, NAA: SARS-CoV-2, NAA: NOT DETECTED

## 2018-08-19 ENCOUNTER — Other Ambulatory Visit: Payer: Self-pay | Admitting: Family Medicine

## 2018-08-27 ENCOUNTER — Other Ambulatory Visit (INDEPENDENT_AMBULATORY_CARE_PROVIDER_SITE_OTHER): Payer: BC Managed Care – PPO

## 2018-08-27 ENCOUNTER — Other Ambulatory Visit: Payer: Self-pay

## 2018-08-27 DIAGNOSIS — E559 Vitamin D deficiency, unspecified: Secondary | ICD-10-CM

## 2018-08-27 LAB — VITAMIN D 25 HYDROXY (VIT D DEFICIENCY, FRACTURES): VITD: 18.34 ng/mL — ABNORMAL LOW (ref 30.00–100.00)

## 2018-08-31 ENCOUNTER — Other Ambulatory Visit: Payer: BLUE CROSS/BLUE SHIELD

## 2018-09-07 ENCOUNTER — Ambulatory Visit (INDEPENDENT_AMBULATORY_CARE_PROVIDER_SITE_OTHER): Payer: BC Managed Care – PPO | Admitting: Family Medicine

## 2018-09-07 ENCOUNTER — Encounter: Payer: Self-pay | Admitting: Family Medicine

## 2018-09-07 ENCOUNTER — Other Ambulatory Visit: Payer: Self-pay

## 2018-09-07 DIAGNOSIS — B372 Candidiasis of skin and nail: Secondary | ICD-10-CM

## 2018-09-07 DIAGNOSIS — I10 Essential (primary) hypertension: Secondary | ICD-10-CM | POA: Diagnosis not present

## 2018-09-07 DIAGNOSIS — F419 Anxiety disorder, unspecified: Secondary | ICD-10-CM | POA: Diagnosis not present

## 2018-09-07 DIAGNOSIS — B354 Tinea corporis: Secondary | ICD-10-CM | POA: Diagnosis not present

## 2018-09-07 MED ORDER — ESCITALOPRAM OXALATE 5 MG PO TABS: 2.5000 mg | ORAL_TABLET | Freq: Every day | ORAL | 3 refills | Status: DC

## 2018-09-07 MED ORDER — KETOCONAZOLE 1 % EX SHAM: 1.0000 | MEDICATED_SHAMPOO | CUTANEOUS | 1 refills | Status: DC

## 2018-09-07 MED ORDER — ALPRAZOLAM 0.25 MG PO TABS: 0.2500 mg | ORAL_TABLET | Freq: Two times a day (BID) | ORAL | 2 refills | Status: AC | PRN

## 2018-09-07 MED ORDER — KETOCONAZOLE 2 % EX SHAM: 1.0000 "application " | MEDICATED_SHAMPOO | CUTANEOUS | 1 refills | Status: AC

## 2018-09-07 NOTE — Assessment & Plan Note (Signed)
Consider checking vitals weekly, no changes to meds. Encouraged heart healthy diet such as the DASH diet and exercise as tolerated.

## 2018-09-07 NOTE — Progress Notes (Signed)
Virtual Visit via Video Note  I connected with Gail Levine on 09/07/18 at  8:20 AM EDT by a video enabled telemedicine application and verified that I am speaking with the correct person using two identifiers.  Location: Patient: home Provider: home   I discussed the limitations of evaluation and management by telemedicine and the availability of in person appointments. The patient expressed understanding and agreed to proceed. Magdalene Molly, CMA was able to get the patient set up on a visit, video   Subjective:    Patient ID: Gail Levine, female    DOB: September 17, 1965, 53 y.o.   MRN: [id number]  No chief complaint on file.   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 No recent febrile illness or hospitalizations. She has also had a couple of rashes. Most notable rash is in her groin and very itchy. OTC Clotrimazole has not been helpful. Denies CP/palp/SOB/HA/congestion/fevers/GI or GU c/o. Taking meds as prescribed  Past Medical History:  Diagnosis Date  . Acute bronchitis 09/09/2015  . Allergy   . Arthritis   . Asthma   . Asthma 09/09/2015  . Back pain 01/08/2016  . Breast cancer, left breast ([place])    2007   . Cancer ([place])    2007  . Cervical cancer screening 01/01/2015  . Constipation 11/23/2015  . Dermatitis 01/04/2015  . Estrogen deficient vulvovaginitis 09/14/2014  . Gout   . Gout 06/03/2016  . HIV infection ([place])   . HTN (hypertension), benign 11/23/2015  . Hyperlipidemia   . Positive PPD   . Preventative health care 01/20/2017  . Pruritus 09/04/2014  . Thyroid disease   . Vitamin D deficiency     Past Surgical History:  Procedure Laterality Date  . MASTECTOMY     left breast    Family History  Problem Relation Age of Onset  . Cancer Mother        cervical cancer  . Hypertension Father   . Stroke Father   . Heart disease Father   . Diabetes Father   . Pulmonary fibrosis Father   . Cancer  Sister        cervical cancer    Social History   Socioeconomic History  . Marital status: Single    Spouse name: Not on file  . Number of children: Not on file  . Years of education: Not on file  . Highest education level: Not on file  Occupational History  . Occupation: patient care coordinator  Social Needs  . Financial resource strain: Not on file  . Food insecurity    Worry: Not on file    Inability: Not on file  . Transportation needs    Medical: Not on file    Non-medical: Not on file  Tobacco Use  . Smoking status: Never Smoker  . Smokeless tobacco: Never Used  Substance and Sexual Activity  . Alcohol use: Yes    Alcohol/week: 0.0 standard drinks  . Drug use: No  . Sexual activity: Never  Lifestyle  . Physical activity    Days per week: Not on file    Minutes per session: Not on file  . Stress: Not on file  Relationships  . Social Herbalist on phone: Not on file    Gets together: Not on file    Attends religious service: Not on file    Active member of club or organization: Not on  file    Attends meetings of clubs or organizations: Not on file    Relationship status: Not on file  . Intimate partner violence    Fear of current or ex partner: Not on file    Emotionally abused: Not on file    Physically abused: Not on file    Forced sexual activity: Not on file  Other Topics Concern  . Not on file  Social History Narrative  . Not on file    Outpatient Medications Prior to Visit  Medication Sig Dispense Refill  . allopurinol (ZYLOPRIM) 100 MG tablet TAKE 1 TABLET BY MOUTH EVERY DAY 30 tablet 2  . Ascorbic Acid (VITAMIN C) 1000 MG tablet Take 1,000 mg by mouth daily.    Marland Kitchen aspirin EC 81 MG tablet Take 1 tablet (81 mg total) by mouth daily.    Marland Kitchen b complex vitamins tablet Take 1 tablet by mouth daily.    . calcium carbonate (OS-CAL) 600 MG TABS tablet Take 1,800 mg by mouth 2 (two) times daily with a meal.    . diltiazem (CARDIZEM) 30 MG tablet  Take 1 tablet (30 mg total) by mouth 3 (three) times daily.    . diphenhydrAMINE (BENADRYL) 25 MG tablet Take 25 mg by mouth every 6 (six) hours as needed for itching.     . fluconazole (DIFLUCAN) 150 MG tablet Take 1 tablet (150 mg total) by mouth once a week. 2 tablet 0  . hydrochlorothiazide (HYDRODIURIL) 25 MG tablet Take 0.5 tablets (12.5 mg total) by mouth daily as needed. 1/2 tablet by mouth every day    . levalbuterol (XOPENEX HFA) 45 MCG/ACT inhaler Inhale 1 puff into the lungs every 6 (six) hours as needed for wheezing or shortness of breath. Fill as daw. They do have permission to fill either but go ahead and rewrite. 1 Inhaler 5  . Multiple Vitamin (MULTIVITAMIN) tablet Take 1 tablet by mouth daily.    . SUMAtriptan (IMITREX) 50 MG tablet Take 1 tablet (50 mg total) by mouth every 2 (two) hours as needed for migraine. May repeat in 2 hours if headache persists or recurs. 10 tablet 1  . Vitamin D, Ergocalciferol, (DRISDOL) 1.25 MG (50000 UT) CAPS capsule Take 1 capsule (50,000 Units total) by mouth every 7 (seven) days. 4 capsule 5   No facility-administered medications prior to visit.     Allergies  Allergen Reactions  . Sulfa Antibiotics Anaphylaxis  . Penicillins Rash    Review of Systems  Constitutional: Negative for fever and malaise/fatigue.  HENT: Negative for congestion.   Eyes: Negative for blurred vision.  Respiratory: Negative for shortness of breath.   Cardiovascular: Negative for chest pain, palpitations and leg swelling.  Gastrointestinal: Negative for abdominal pain, blood in stool and nausea.  Genitourinary: Negative for dysuria and frequency.  Musculoskeletal: Negative for falls.  Skin: Positive for itching and rash.  Neurological: Negative for dizziness, loss of consciousness and headaches.  Endo/Heme/Allergies: Negative for environmental allergies.  Psychiatric/Behavioral: Negative for depression. The patient is nervous/anxious.        Objective:     Physical Exam Constitutional:      Appearance: Normal appearance. She is not ill-appearing.  HENT:     Head: Normocephalic and atraumatic.     Nose: Nose normal.  Pulmonary:     Effort: Pulmonary effort is normal.  Neurological:     Mental Status: She is alert and oriented to person, place, and time.  Psychiatric:  Mood and Affect: Mood normal.        Behavior: Behavior normal.     BP 110/73 (BP Location: Left Arm, Patient Position: Sitting, Cuff Size: Normal)   Pulse 70   Temp (!) 97.3 F (36.3 C) (Oral)   Wt 114 lb 3.2 oz (51.8 kg)   SpO2 98%   BMI 22.30 kg/m  Wt Readings from Last 3 Encounters:  09/07/18 114 lb 3.2 oz (51.8 kg)  08/13/18 113 lb (51.3 kg)  03/02/18 123 lb 6.4 oz (56 kg)    Diabetic Foot Exam - Simple   No data filed     Lab Results  Component Value Date   WBC 3.8 (L) 02/23/2018   HGB 13.5 02/23/2018   HCT 41.2 02/23/2018   PLT 201.0 02/23/2018   GLUCOSE 76 02/23/2018   CHOL 197 02/23/2018   TRIG 61.0 02/23/2018   HDL 67.90 02/23/2018   LDLCALC 117 (H) 02/23/2018   ALT 9 02/23/2018   AST 19 02/23/2018   NA 141 02/23/2018   K 4.1 02/23/2018   CL 102 02/23/2018   CREATININE 0.69 02/23/2018   BUN 11 02/23/2018   CO2 29 02/23/2018   TSH 4.37 02/23/2018   HGBA1C 5.0 02/23/2018    Lab Results  Component Value Date   TSH 4.37 02/23/2018   Lab Results  Component Value Date   WBC 3.8 (L) 02/23/2018   HGB 13.5 02/23/2018   HCT 41.2 02/23/2018   MCV 93.9 02/23/2018   PLT 201.0 02/23/2018   Lab Results  Component Value Date   NA 141 02/23/2018   K 4.1 02/23/2018   CHLORIDE 104 07/01/2016   CO2 29 02/23/2018   GLUCOSE 76 02/23/2018   BUN 11 02/23/2018   CREATININE 0.69 02/23/2018   BILITOT 0.8 02/23/2018   ALKPHOS 61 02/23/2018   AST 19 02/23/2018   ALT 9 02/23/2018   PROT 7.6 02/23/2018   ALBUMIN 4.2 02/23/2018   CALCIUM 9.3 02/23/2018   ANIONGAP 7 07/01/2016   EGFR 90 (L) 07/01/2016   GFR 89.97 02/23/2018   Lab  Results  Component Value Date   CHOL 197 02/23/2018   Lab Results  Component Value Date   HDL 67.90 02/23/2018   Lab Results  Component Value Date   LDLCALC 117 (H) 02/23/2018   Lab Results  Component Value Date   TRIG 61.0 02/23/2018   Lab Results  Component Value Date   CHOLHDL 3 02/23/2018   Lab Results  Component Value Date   HGBA1C 5.0 02/23/2018       Assessment & Plan:   Problem List Items Addressed This Visit    HTN (hypertension), benign    Consider checking vitals weekly, no changes to meds. Encouraged heart healthy diet such as the DASH diet and exercise as tolerated.       Tinea corporis   Relevant Medications   ketoconazole (NIZORAL) 2 % shampoo (Start on 09/10/2018)   Candidal dermatitis    In groin, has failed topical clotrimazole. Will add Diflucan 150 mg po weekly x 2 weeks.      Relevant Medications   ketoconazole (NIZORAL) 2 % shampoo (Start on 09/10/2018)   Anxiety    The pandemic has increased the anxiety of working in the dental field. Is manageable most days but is allowed a small amount of Alprazolam 0.25 mg tabs to use sparingly for significant anxiety.       Relevant Medications   ALPRAZolam (XANAX) 0.25 MG tablet   escitalopram (LEXAPRO)  5 MG tablet      I have discontinued Sarahbeth Mamone's KETOCONAZOLE (TOPICAL). I am also having her start on ALPRAZolam, escitalopram, and ketoconazole. Additionally, I am having her maintain her multivitamin, vitamin C, calcium carbonate, b complex vitamins, diphenhydrAMINE, diltiazem, aspirin EC, hydrochlorothiazide, Vitamin D (Ergocalciferol), SUMAtriptan, levalbuterol, fluconazole, and allopurinol.  Meds ordered this encounter  Medications  . ALPRAZolam (XANAX) 0.25 MG tablet    Sig: Take 1 tablet (0.25 mg total) by mouth 2 (two) times daily as needed for anxiety.    Dispense:  20 tablet    Refill:  2  . escitalopram (LEXAPRO) 5 MG tablet    Sig: Take 0.5-1 tablets (2.5-5 mg total) by mouth daily.     Dispense:  30 tablet    Refill:  3  . DISCONTD: KETOCONAZOLE, TOPICAL, 1 % SHAM    Sig: Apply 1 Dose topically once a week.    Dispense:  200 mL    Refill:  1  . ketoconazole (NIZORAL) 2 % shampoo    Sig: Apply 1 application topically 2 (two) times a week.    Dispense:  120 mL    Refill:  1    I discussed the assessment and treatment plan with the patient. The patient was provided an opportunity to ask questions and all were answered. The patient agreed with the plan and demonstrated an understanding of the instructions.   The patient was advised to call back or seek an in-person evaluation if the symptoms worsen or if the condition fails to improve as anticipated.  I provided 15 minutes of non-face-to-face time during this encounter.   Penni Homans, MD

## 2018-09-07 NOTE — Assessment & Plan Note (Signed)
In groin, has failed topical clotrimazole. Will add Diflucan 150 mg po weekly x 2 weeks.

## 2018-09-07 NOTE — Assessment & Plan Note (Signed)
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 Is manageable most days but is allowed a small amount of Alprazolam 0.25 mg tabs to use sparingly for significant anxiety.

## 2018-10-12 ENCOUNTER — Ambulatory Visit: Payer: BLUE CROSS/BLUE SHIELD | Admitting: Registered"

## 2018-10-12 DIAGNOSIS — I10 Essential (primary) hypertension: Secondary | ICD-10-CM | POA: Diagnosis not present

## 2018-10-12 DIAGNOSIS — R600 Localized edema: Secondary | ICD-10-CM | POA: Diagnosis not present

## 2018-10-12 DIAGNOSIS — R002 Palpitations: Secondary | ICD-10-CM | POA: Diagnosis not present

## 2018-10-12 DIAGNOSIS — R0602 Shortness of breath: Secondary | ICD-10-CM | POA: Diagnosis not present

## 2018-11-08 ENCOUNTER — Other Ambulatory Visit: Payer: Self-pay

## 2018-11-08 ENCOUNTER — Ambulatory Visit: Payer: BC Managed Care – PPO

## 2018-11-08 DIAGNOSIS — Z23 Encounter for immunization: Secondary | ICD-10-CM

## 2018-11-22 ENCOUNTER — Other Ambulatory Visit: Payer: Self-pay | Admitting: Family Medicine

## 2019-03-07 ENCOUNTER — Other Ambulatory Visit: Payer: Self-pay | Admitting: Family Medicine

## 2019-03-08 ENCOUNTER — Other Ambulatory Visit: Payer: Self-pay | Admitting: Family Medicine

## 2019-03-08 NOTE — Telephone Encounter (Signed)
RX was printed instead of being sent electronically on 03/03/19.

## 2019-04-03 ENCOUNTER — Other Ambulatory Visit: Payer: Self-pay | Admitting: Family Medicine

## 2019-04-10 ENCOUNTER — Other Ambulatory Visit (HOSPITAL_BASED_OUTPATIENT_CLINIC_OR_DEPARTMENT_OTHER): Payer: Self-pay | Admitting: Family Medicine

## 2019-04-10 DIAGNOSIS — Z1231 Encounter for screening mammogram for malignant neoplasm of breast: Secondary | ICD-10-CM

## 2019-04-12 DIAGNOSIS — I10 Essential (primary) hypertension: Secondary | ICD-10-CM | POA: Diagnosis not present

## 2019-04-12 DIAGNOSIS — J4540 Moderate persistent asthma, uncomplicated: Secondary | ICD-10-CM | POA: Diagnosis not present

## 2019-04-12 DIAGNOSIS — R079 Chest pain, unspecified: Secondary | ICD-10-CM | POA: Diagnosis not present

## 2019-04-12 DIAGNOSIS — R0602 Shortness of breath: Secondary | ICD-10-CM | POA: Diagnosis not present

## 2019-04-23 ENCOUNTER — Ambulatory Visit (HOSPITAL_BASED_OUTPATIENT_CLINIC_OR_DEPARTMENT_OTHER): Payer: BLUE CROSS/BLUE SHIELD

## 2019-04-23 ENCOUNTER — Encounter: Payer: BLUE CROSS/BLUE SHIELD | Admitting: Family Medicine

## 2019-06-07 ENCOUNTER — Other Ambulatory Visit: Payer: Self-pay | Admitting: Family Medicine

## 2019-07-04 ENCOUNTER — Encounter: Payer: BLUE CROSS/BLUE SHIELD | Admitting: Family Medicine

## 2019-07-30 ENCOUNTER — Ambulatory Visit (HOSPITAL_BASED_OUTPATIENT_CLINIC_OR_DEPARTMENT_OTHER): Payer: BLUE CROSS/BLUE SHIELD

## 2019-07-31 ENCOUNTER — Other Ambulatory Visit: Payer: Self-pay | Admitting: Family Medicine

## 2019-08-13 ENCOUNTER — Encounter: Payer: BLUE CROSS/BLUE SHIELD | Admitting: Family Medicine

## 2019-09-18 ENCOUNTER — Other Ambulatory Visit: Payer: Self-pay | Admitting: Family Medicine

## 2019-09-27 ENCOUNTER — Ambulatory Visit (INDEPENDENT_AMBULATORY_CARE_PROVIDER_SITE_OTHER): Payer: Self-pay

## 2019-09-27 ENCOUNTER — Other Ambulatory Visit: Payer: Self-pay

## 2019-09-27 DIAGNOSIS — Z23 Encounter for immunization: Secondary | ICD-10-CM

## 2019-10-10 DIAGNOSIS — Z23 Encounter for immunization: Secondary | ICD-10-CM | POA: Diagnosis not present

## 2020-01-09 ENCOUNTER — Encounter: Payer: BLUE CROSS/BLUE SHIELD | Admitting: Family Medicine

## 2020-02-04 ENCOUNTER — Other Ambulatory Visit: Payer: Self-pay | Admitting: Family Medicine

## 2020-03-03 ENCOUNTER — Ambulatory Visit (INDEPENDENT_AMBULATORY_CARE_PROVIDER_SITE_OTHER): Payer: BC Managed Care – PPO | Admitting: Medical

## 2020-03-03 ENCOUNTER — Encounter: Payer: Self-pay | Admitting: Medical

## 2020-03-03 ENCOUNTER — Other Ambulatory Visit: Payer: Self-pay

## 2020-03-03 VITALS — BP 165/85 | HR 74 | Resp 16 | Ht 60.0 in | Wt 128.0 lb

## 2020-03-03 DIAGNOSIS — I10 Essential (primary) hypertension: Secondary | ICD-10-CM

## 2020-03-03 DIAGNOSIS — G43809 Other migraine, not intractable, without status migrainosus: Secondary | ICD-10-CM

## 2020-03-03 MED ORDER — KETOROLAC TROMETHAMINE 30 MG/ML IJ SOLN
30.0000 mg | Freq: Once | INTRAMUSCULAR | Status: AC
  Administered 2020-03-03: 30 mg via INTRAMUSCULAR

## 2020-03-03 NOTE — Patient Instructions (Signed)
History of migraine headaches with increased frequency over the last 2 weeks and persistent migraine type headaches since on Monday.  Continue current migraine medication regimen but we did give Toradol 30 mg IM injection today.  Hopefully this will stop your headache.  Asked that you send me a MyChart message tomorrow let me know if headache resolved.  If you have any headaches with any gross motor or sensory function deficits then recommend being seen in the emergency department for evaluation/probable CT of head.  Note we will [place] how you respond to Toradol.  If you have recurrent intermittent headaches would likely repeat CT of head outpatient.  Note last CT of head 2017 was negative.  Also if  headaches persist and are intermittent might consider referral to neurologist.  Your blood pressure usually is moderately well controlled.  Trending upward since last Friday and had a day.  I do want to go ahead and take extra Toprol XL 25mg  this afternoon/night when you get home.  Follow-up in 5 to 7 days or as needed.  But reminded to please update me via MyChart on if headache resolved.  Also check your blood pressure and update me on BP reading.

## 2020-03-03 NOTE — Progress Notes (Signed)
Subjective:    Patient ID: Gail Levine, female    DOB: 04/30/1965, 55 y.o.   MRN: [id number]  HPI  Pt in states she recent headaches. She gets occasional rare ha about every 5 years. She states recently she had ha more frequent past 2 weeks.   She reports history of migraine HA. She ha hx of imitrex use would stop HA quickly. She get migraine features light sensitive, sound sensitive and nausea.   Pt had ct of head in 2017. Showed.  FINDINGS: CT HEAD FINDINGS  No acute intracranial hemorrhage, mass lesion, or infarction. No extra-axial fluid collection, midline shift, herniation or hydrocephalus. Cisterns remain patent. No focal mass effect or edema. Gray-white matter differentiation is maintained. No cerebellar abnormality. Orbits are symmetric. Mastoids and sinuses remain clear. Skull intact.  Pt states on past Friday had ha and still present.    Pt tried imitrex and  Aspirin.  Pt has history of htn. Pt on hctz 25 mg, toprol xl 25 mg and cardizem 30 mg daily tid.  At home 130-140/85. Once a day.Today bp reading is high. But graduallly Going up since Friday.    Review of Systems  Constitutional: Negative for chills, fatigue and fever.  HENT: Negative for congestion, ear discharge and ear pain.   Eyes: Negative for pain.  Respiratory: Negative for cough, chest tightness, shortness of breath and wheezing.   Cardiovascular: Negative for chest pain and palpitations.  Gastrointestinal: Negative for abdominal pain, blood in stool, constipation, diarrhea and nausea.  Musculoskeletal: Negative for back pain.  Skin: Negative for rash.  Neurological: Positive for headaches. Negative for dizziness, syncope, speech difficulty, weakness and numbness.  Hematological: Negative for adenopathy. Does not bruise/bleed easily.  Psychiatric/Behavioral: Negative for behavioral problems, confusion, sleep disturbance and suicidal ideas. The patient is not nervous/anxious.      Past Medical  History:  Diagnosis Date  . Acute bronchitis 09/09/2015  . Allergy   . Arthritis   . Asthma   . Asthma 09/09/2015  . Back pain 01/08/2016  . Breast cancer, left breast ([place])    2007   . Cancer ([place])    2007  . Cervical cancer screening 01/01/2015  . Constipation 11/23/2015  . Dermatitis 01/04/2015  . Estrogen deficient vulvovaginitis 09/14/2014  . Gout   . Gout 06/03/2016  . HIV infection ([place])   . HTN (hypertension), benign 11/23/2015  . Hyperlipidemia   . Positive PPD   . Preventative health care 01/20/2017  . Pruritus 09/04/2014  . Thyroid disease   . Vitamin D deficiency      Social History   Socioeconomic History  . Marital status: Single    Spouse name: Not on file  . Number of children: Not on file  . Years of education: Not on file  . Highest education level: Not on file  Occupational History  . Occupation: patient care coordinator  Tobacco Use  . Smoking status: Never Smoker  . Smokeless tobacco: Never Used  Substance and Sexual Activity  . Alcohol use: Yes    Alcohol/week: 0.0 standard drinks  . Drug use: No  . Sexual activity: Never  Other Topics Concern  . Not on file  Social History Narrative  . Not on file   Social Determinants of Health   Financial Resource Strain: Not on file  Food Insecurity: Not on file  Transportation Needs: Not on file  Physical Activity: Not on file  Stress: Not on file  Social Connections: Not on file  Intimate  Partner Violence: Not on file    Past Surgical History:  Procedure Laterality Date  . MASTECTOMY     left breast    Family History  Problem Relation Age of Onset  . Cancer Mother        cervical cancer  . Hypertension Father   . Stroke Father   . Heart disease Father   . Diabetes Father   . Pulmonary fibrosis Father   . Cancer Sister        cervical cancer    Allergies  Allergen Reactions  . Sulfa Antibiotics Anaphylaxis  . Penicillins Rash    Current Outpatient Medications on File Prior to Visit   Medication Sig Dispense Refill  . allopurinol (ZYLOPRIM) 100 MG tablet Take 1 tablet (100 mg total) by mouth daily. 90 tablet 1  . ALPRAZolam (XANAX) 0.25 MG tablet Take 1 tablet (0.25 mg total) by mouth 2 (two) times daily as needed for anxiety. 20 tablet 2  . Ascorbic Acid (VITAMIN C) 1000 MG tablet Take 1,000 mg by mouth daily.    Marland Kitchen aspirin EC 81 MG tablet Take 1 tablet (81 mg total) by mouth daily.    Marland Kitchen b complex vitamins tablet Take 1 tablet by mouth daily.    . calcium carbonate (OS-CAL) 600 MG TABS tablet Take 1,800 mg by mouth 2 (two) times daily with a meal.    . diltiazem (CARDIZEM) 30 MG tablet Take 1 tablet (30 mg total) by mouth 3 (three) times daily.    . diphenhydrAMINE (BENADRYL) 25 MG tablet Take 25 mg by mouth every 6 (six) hours as needed for itching.     . escitalopram (LEXAPRO) 5 MG tablet TAKE 0.5-1 TABLETS (2.5-5 MG TOTAL) BY MOUTH DAILY. 90 tablet 1  . fluconazole (DIFLUCAN) 150 MG tablet Take 1 tablet (150 mg total) by mouth once a week. 2 tablet 0  . hydrochlorothiazide (HYDRODIURIL) 25 MG tablet Take 0.5 tablets (12.5 mg total) by mouth daily as needed. 1/2 tablet by mouth every day    . ketoconazole (NIZORAL) 2 % shampoo Apply 1 application topically 2 (two) times a week. 120 mL 1  . levalbuterol (XOPENEX HFA) 45 MCG/ACT inhaler Inhale 1 puff into the lungs every 6 (six) hours as needed for wheezing or shortness of breath. Fill as daw. They do have permission to fill either but go ahead and rewrite. 1 Inhaler 5  . metoprolol succinate (TOPROL-XL) 25 MG 24 hr tablet Take by mouth.    . Multiple Vitamin (MULTIVITAMIN) tablet Take 1 tablet by mouth daily.    . SUMAtriptan (IMITREX) 50 MG tablet Take 1 tablet (50 mg total) by mouth every 2 (two) hours as needed for migraine (max 2 doses in 24 hours). 10 tablet 1  . Vitamin D, Ergocalciferol, (DRISDOL) 1.25 MG (50000 UT) CAPS capsule Take 1 capsule (50,000 Units total) by mouth every 7 (seven) days. 4 capsule 5   No  current facility-administered medications on file prior to visit.    BP (!) 165/85   Pulse 74   Resp 16   Ht 5' (1.524 m)   Wt 128 lb (58.1 kg)   SpO2 97%   BMI 25.00 kg/m       Objective:   Physical Exam  General Mental Status- Alert. General Appearance- Not in acute distress.   Skin General: Color- Normal Color. Moisture- Normal Moisture.  Neck Carotid Arteries- Normal color. Moisture- Normal Moisture. No carotid bruits. No JVD. nd Lung Exam Auscultation: Breath Sounds:-Normal.  Cardiovascular Auscultation:Rythm- Regular. Murmurs & Other Heart Sounds:Auscultation of the heart reveals- No Murmurs.  Neurologic Cranial Nerve exam:- CN III-XII intact(No nystagmus), symmetric smile. Drift Test:- No drift. Romberg Exam:- Negative.  Heal to Toe Gait exam:-Normal. Finger to Nose:- Normal/Intact  Strength:- 5/5 equal and symmetric strength both upper and lower extremities.      Assessment & Plan:  History of migraine headaches with increased frequency over the last 2 weeks and persistent migraine type headaches since on Monday.  Continue current migraine medication regimen but we did give Toradol 30 mg IM injection today.  Hopefully this will stop your headache.  Asked that you send me a MyChart message tomorrow let me know if headache resolved.  If you have any headaches with any gross motor or sensory function deficits then recommend being seen in the emergency department for evaluation/probable CT of head.  Note we will [place] how you respond to Toradol.  If you have recurrent intermittent headaches would likely repeat CT of head outpatient.  Note last CT of head 2017 was negative.  Also headaches persist and are intermittent might consider referral to neurologist.  Your blood pressure usually is moderately well controlled.  Trending upward since last Friday and had a day.  I do want to go ahead and take extra Toprol XL 25mg  this afternoon/night when you get  home.  Follow-up in 5 to 7 days or as needed.  But reminded to please update me via MyChart on if headache resolved.  Also check your blood pressure and update me on BP reading.

## 2020-03-04 ENCOUNTER — Encounter: Payer: Self-pay | Admitting: Medical

## 2020-03-05 ENCOUNTER — Other Ambulatory Visit: Payer: BC Managed Care – PPO

## 2020-03-05 DIAGNOSIS — Z20822 Contact with and (suspected) exposure to covid-19: Secondary | ICD-10-CM

## 2020-03-06 ENCOUNTER — Telehealth (INDEPENDENT_AMBULATORY_CARE_PROVIDER_SITE_OTHER): Payer: BC Managed Care – PPO | Admitting: Medical

## 2020-03-06 ENCOUNTER — Other Ambulatory Visit: Payer: Self-pay

## 2020-03-06 VITALS — BP 108/83 | HR 69 | Temp 97.7°F

## 2020-03-06 DIAGNOSIS — M791 Myalgia, unspecified site: Secondary | ICD-10-CM

## 2020-03-06 DIAGNOSIS — J3489 Other specified disorders of nose and nasal sinuses: Secondary | ICD-10-CM

## 2020-03-06 DIAGNOSIS — R059 Cough, unspecified: Secondary | ICD-10-CM | POA: Diagnosis not present

## 2020-03-06 LAB — SARS-COV-2, NAA 2 DAY TAT

## 2020-03-06 LAB — NOVEL CORONAVIRUS, NAA: SARS-CoV-2, NAA: DETECTED — AB

## 2020-03-06 MED ORDER — AZITHROMYCIN 250 MG PO TABS
ORAL_TABLET | ORAL | 0 refills | Status: DC
Start: 2020-03-06 — End: 2020-06-22

## 2020-03-06 MED ORDER — FLUTICASONE PROPIONATE 50 MCG/ACT NA SUSP
2.0000 | Freq: Every day | NASAL | 1 refills | Status: DC
Start: 2020-03-06 — End: 2022-06-09

## 2020-03-06 MED ORDER — BENZONATATE 100 MG PO CAPS: 100.0000 mg | ORAL_CAPSULE | Freq: Three times a day (TID) | ORAL | 0 refills | Status: DC | PRN

## 2020-03-06 NOTE — Progress Notes (Signed)
Subjective:    Patient ID: Gail Levine, female    DOB: January 02, 1966, 55 y.o.   MRN: [id number]  HPI  Virtual Visit via Video Note  I connected with Natoya Turri on 03/06/20 at  9:00 AM EST by a video enabled telemedicine application and verified that I am speaking with the correct person using two identifiers.  Location: Patient: home Provider: office  Participants- pt and myself.   I discussed the limitations of evaluation and management by telemedicine and the availability of in person appointments. The patient expressed understanding and agreed to proceed.  History of Present Illness: Pt states has been coughing and her throat is hurting a lot. On and off mild body aches. Pt has pending pcr test. Pt sense of smell and taste is normal. Pt has been vaccinated against covid and boosted.  Pt notes that at times her pulse goes up to 89-90. Pt added extra diltiazem when her heart rate got up to 90.  Pt states some low grade fevers.  Pt is on vit D 50,000 weekly.   Pt migraine ha did improve a lot.     Observations/Objective:  General-no acute distress, pleasant, oriented. Lungs- on inspection lungs appear unlabored. Neck- no tracheal deviation or jvd on inspection. Neuro- gross motor function appears intact.  Assessment and Plan: Recent signs and symptoms of cough, some a sore throat, mild body aches and sinus pressure.  Patient has Covid PCR test pending.  Advised patient to rest, hydrate and take Tylenol or ibuprofen for fever.  She can continue vitamin D.  Prescribed Flonase nasal spray, benzonatate cough tablets and making azithromycin antibiotic available if she has worsening sinus pressure or chest congestion.  Patient has history of palpitations when she mention history of SVT.  She stated took extra Cardizem due to elevated pulse.  Her blood pressure is a little low today.  Advised against this.  Explained to make sure she is well-hydrated/avoid dehydration.  Check  temperature to make sure she is not having fever as that can increase of pulse.  If fever then take Tylenol or ibuprofen.  Also explained that if she does test positive for COVID then she can have some autonomic dysregulation.  Migraine headache the other day resolved.  Follow-up in 7 to 10 days or as needed.  Please update [place] when you get your PCR Covid test results.  Time spent with patient today was 23  minutes which consisted of chart review, discussing diagnosis, work up treatment and documentation.  Follow Up Instructions:    I discussed the assessment and treatment plan with the patient. The patient was provided an opportunity to ask questions and all were answered. The patient agreed with the plan and demonstrated an understanding of the instructions.   The patient was advised to call back or seek an in-person evaluation if the symptoms worsen or if the condition fails to improve as anticipated.     Mackie Pai, PA-C   Review of Systems  Constitutional: Negative for chills, fatigue and fever.  HENT: Positive for congestion. Negative for ear pain, nosebleeds, postnasal drip, rhinorrhea, sinus pressure and sneezing.   Respiratory: Positive for cough. Negative for choking, shortness of breath and wheezing.   Cardiovascular: Negative for chest pain and palpitations.  Gastrointestinal: Negative for abdominal pain.  Musculoskeletal: Positive for myalgias. Negative for back pain.  Skin: Negative for rash.  Neurological: Negative for dizziness, weakness, light-headedness, numbness and headaches.  Hematological: Negative for adenopathy. Does not bruise/bleed easily.  Psychiatric/Behavioral:  Negative for behavioral problems, confusion and sleep disturbance. The patient is not nervous/anxious.        Objective:   Physical Exam        Assessment & Plan:

## 2020-03-06 NOTE — Patient Instructions (Addendum)
Recent signs and symptoms of cough, some a sore throat, mild body aches and sinus pressure.  Patient has Covid PCR test pending.  Advised patient to rest, hydrate and take Tylenol or ibuprofen for fever.  She can continue vitamin D.  Prescribed Flonase nasal spray, benzonatate cough tablets and making azithromycin antibiotic available if she has worsening sinus pressure or chest congestion.  Patient has history of palpitations when she mention history of SVT.  She stated took extra Cardizem due to elevated pulse.  Her blood pressure is a little low today.  Advised against this.  Explained to make sure she is well-hydrated/avoid dehydration.  Check temperature to make sure she is not having fever as that can increase of pulse.  If fever then take Tylenol or ibuprofen.  Also explained that if she does test positive for COVID then she can have some autonomic dysregulation.  Migraine headache the other day resolved.  Follow-up in 7 to 10 days or as needed.  Please update [place] when you get your PCR Covid test results.

## 2020-03-07 ENCOUNTER — Telehealth: Payer: Self-pay | Admitting: Family

## 2020-03-07 NOTE — Telephone Encounter (Signed)
Called to discuss with patient about COVID-19 symptoms and the use of one of the available treatments for those with mild to moderate Covid symptoms and at a high risk of hospitalization.  Pt appears to qualify for outpatient treatment due to co-morbid conditions and/or a member of an at-risk group in accordance with the FDA Emergency Use Authorization.    Symptom onset:  Vaccinated: Yes Booster? Yes Immunocompromised? No Qualifiers: Asthma, hypertension, breast cancer   I attempted to contact Ms. Gail Levine to gather additional information and was unable to reach her via phone. Her voicemail box was full and was not able to leave a message. A MyChart message was left for follow up.  Terri Piedra, NP 03/07/2020 10:58 AM

## 2020-03-10 ENCOUNTER — Telehealth (INDEPENDENT_AMBULATORY_CARE_PROVIDER_SITE_OTHER): Payer: BC Managed Care – PPO | Admitting: Medical

## 2020-03-10 ENCOUNTER — Other Ambulatory Visit: Payer: Self-pay

## 2020-03-10 DIAGNOSIS — J3489 Other specified disorders of nose and nasal sinuses: Secondary | ICD-10-CM

## 2020-03-10 DIAGNOSIS — U071 COVID-19: Secondary | ICD-10-CM

## 2020-03-10 DIAGNOSIS — R059 Cough, unspecified: Secondary | ICD-10-CM | POA: Diagnosis not present

## 2020-03-10 NOTE — Patient Instructions (Signed)
Covid signs and symptoms for about 6 days and tested positive this past Friday.  Symptoms persist with present symptoms of chest congestion, productive cough, sinus pressure itchy throat mild body aches and persistent low-level fever if not taking Tylenol.  Recommended to continue Flonase, benzonatate vitamin D and zinc.  Still taking azithromycin antibiotic.  Since you report not improving as I hope and desire for further treatment I went ahead and placed referral to the Covid clinic.  Please get chest x-ray tomorrow morning as that result might be beneficial in determining need for further treatment.  Follow-up in 7 to 10 days or as needed.

## 2020-03-10 NOTE — Progress Notes (Signed)
   Subjective:    Patient ID: Gail Levine, female    DOB: [birth year], 55 y.o.   MRN: [id number]  HPI  Virtual Visit via Video Note  I connected with Gail Levine on 03/10/20 at  4:00 PM EST by a video enabled telemedicine application and verified that I am speaking with the correct person using two identifiers.  Location: Patient: home Provider: office  Participants- pt and myself.   I discussed the limitations of evaluation and management by telemedicine and the availability of in person appointments. The patient expressed understanding and agreed to proceed.  History of Present Illness: Covid+ on past Friday.  Pt still have cough and some productive mucus when coughs. Pt states sinus pressure present. Pt throat is itching. Mild body aches. Pt taste and smell is intact. Pt has been vaccinated against covid and boosted. Pt has not had any fevers since Sunday night. 9 pm. But she is taking tylenol cold and flu. If does not take will have fever.   On last visit I advised   Flonase nasal spray, benzonatate cough tablets and making azithromycin antibiotic available if she has worsening sinus pressure or chest congestion.  Pt employer wanted her to work. With N-95. I informed pt of CDC protocols.    Observations/Objective: General-no acute distress, pleasant, oriented. Lungs- on inspection lungs appear unlabored. Neck- no tracheal deviation or jvd on inspection. Neuro- gross motor function appears intact.   Assessment and Plan: Covid signs and symptoms for about 6 days and tested positive this past Friday.  Symptoms persist with present symptoms of chest congestion, productive cough, sinus pressure itchy throat mild body aches and persistent low-level fever if not taking Tylenol.  Recommended to continue Flonase, benzonatate vitamin D and zinc.  Still taking azithromycin antibiotic.  Since you report not improving as I hope and desire for further treatment I went ahead and placed referral  to the Covid clinic.  Please get chest x-ray tomorrow morning as that result might be beneficial in determining need for further treatment.  Follow-up in 7 to 10 days or as needed.  Follow Up Instructions:    I discussed the assessment and treatment plan with the patient. The patient was provided an opportunity to ask questions and all were answered. The patient agreed with the plan and demonstrated an understanding of the instructions.   The patient was advised to call back or seek an in-person evaluation if the symptoms worsen or if the condition fails to improve as anticipated.  Time spent with patient today was 30  minutes which consisted of chart review, discussing diagnosis, work up, treatment and documentation.   Mackie Pai, PA-C    Review of Systems     Objective:   Physical Exam        Assessment & Plan:

## 2020-03-11 ENCOUNTER — Ambulatory Visit (HOSPITAL_BASED_OUTPATIENT_CLINIC_OR_DEPARTMENT_OTHER)
Admission: RE | Admit: 2020-03-11 | Discharge: 2020-03-11 | Disposition: A | Discharge status: Home / Self Care | Payer: BC Managed Care – PPO | Source: Ambulatory Visit | Attending: Medical | Admitting: Medical

## 2020-03-11 ENCOUNTER — Other Ambulatory Visit: Payer: Self-pay

## 2020-03-11 DIAGNOSIS — U071 COVID-19: Secondary | ICD-10-CM | POA: Diagnosis not present

## 2020-03-11 DIAGNOSIS — R059 Cough, unspecified: Secondary | ICD-10-CM | POA: Diagnosis not present

## 2020-03-16 ENCOUNTER — Ambulatory Visit (INDEPENDENT_AMBULATORY_CARE_PROVIDER_SITE_OTHER): Payer: BC Managed Care – PPO | Admitting: Nurse Practitioner

## 2020-03-16 VITALS — BP 125/70 | HR 58 | Temp 96.2°F

## 2020-03-16 DIAGNOSIS — Z8616 Personal history of COVID-19: Secondary | ICD-10-CM | POA: Diagnosis not present

## 2020-03-16 MED ORDER — PREDNISONE 20 MG PO TABS: 20.0000 mg | ORAL_TABLET | Freq: Every day | ORAL | 0 refills | Status: AC

## 2020-03-16 NOTE — Progress Notes (Signed)
@Patient  ID: Gail Levine, female    DOB: 08-14-1965, 55 y.o.   MRN: [id number]  Chief Complaint  Patient presents with  . Covid Positive    Patient has a positive on 03/06/2020.  States that she does still have cough.  Recent x-ray was clear.  Patient has finished a round of Z-Pak.    Referring provider: Mosie Lukes, MD     HPI  Patient presents today for post COVID care clinic visit.  Patient tested positive for Covid on 03/06/2020.  She states that she is does still have ongoing cough.  Patient did have a recent chest x-ray that was ordered by PCP which was clear.  Patient did finish a round of Z-Pak and was ordered [employer]  She is also using Flonase.  She is very concerned because she does have a family history of pulmonary fibrosis which her father died from. Denies f/c/s, n/v/d, hemoptysis, PND, chest pain or edema.      Allergies  Allergen Reactions  . Sulfa Antibiotics Anaphylaxis  . Penicillins Rash    Immunization History  Administered Date(s) Administered  . Influenza, Seasonal, Injecte, Preservative Fre 12/26/2006  . Influenza,inj,Quad PF,6+ Mos 09/27/2019  . PFIZER(Purple Top)SARS-COV-2 Vaccination 02/15/2019, 03/08/2019, 11/17/2019  . Tdap 10/28/2016    Past Medical History:  Diagnosis Date  . Acute bronchitis 09/09/2015  . Allergy   . Arthritis   . Asthma   . Asthma 09/09/2015  . Back pain 01/08/2016  . Breast cancer, left breast ([place])    2007   . Cancer ([place])    2007  . Cervical cancer screening 01/01/2015  . Constipation 11/23/2015  . Dermatitis 01/04/2015  . Estrogen deficient vulvovaginitis 09/14/2014  . Gout   . Gout 06/03/2016  . HIV infection ([place])   . HTN (hypertension), benign 11/23/2015  . Hyperlipidemia   . Positive PPD   . Preventative health care 01/20/2017  . Pruritus 09/04/2014  . Thyroid disease   . Vitamin D deficiency     Tobacco History: Social History   Tobacco Use  Smoking Status Never Smoker  Smokeless Tobacco Never  Used   Counseling given: Yes   Outpatient Encounter Medications as of 03/16/2020  Medication Sig  . predniSONE (DELTASONE) 20 MG tablet Take 1 tablet (20 mg total) by mouth daily with breakfast for 5 days.  Marland Kitchen allopurinol (ZYLOPRIM) 100 MG tablet Take 1 tablet (100 mg total) by mouth daily.  Marland Kitchen ALPRAZolam (XANAX) 0.25 MG tablet Take 1 tablet (0.25 mg total) by mouth 2 (two) times daily as needed for anxiety.  . Ascorbic Acid (VITAMIN C) 1000 MG tablet Take 1,000 mg by mouth daily.  Marland Kitchen aspirin EC 81 MG tablet Take 1 tablet (81 mg total) by mouth daily.  Marland Kitchen azithromycin (ZITHROMAX) 250 MG tablet Take 2 tablets by mouth on day 1, followed by 1 tablet by mouth daily for 4 days.  Marland Kitchen b complex vitamins tablet Take 1 tablet by mouth daily.  . benzonatate (TESSALON) 100 MG capsule Take 1 capsule (100 mg total) by mouth 3 (three) times daily as needed for cough.  . calcium carbonate (OS-CAL) 600 MG TABS tablet Take 1,800 mg by mouth 2 (two) times daily with a meal.  . diltiazem (CARDIZEM) 30 MG tablet Take 1 tablet (30 mg total) by mouth 3 (three) times daily.  . diphenhydrAMINE (BENADRYL) 25 MG tablet Take 25 mg by mouth every 6 (six) hours as needed for itching.   . escitalopram (LEXAPRO) 5 MG tablet  TAKE 0.5-1 TABLETS (2.5-5 MG TOTAL) BY MOUTH DAILY.  . fluconazole (DIFLUCAN) 150 MG tablet Take 1 tablet (150 mg total) by mouth once a week.  . fluticasone (FLONASE) 50 MCG/ACT nasal spray Place 2 sprays into both nostrils daily.  . hydrochlorothiazide (HYDRODIURIL) 25 MG tablet Take 0.5 tablets (12.5 mg total) by mouth daily as needed. 1/2 tablet by mouth every day  . ketoconazole (NIZORAL) 2 % shampoo Apply 1 application topically 2 (two) times a week.  . levalbuterol (XOPENEX HFA) 45 MCG/ACT inhaler Inhale 1 puff into the lungs every 6 (six) hours as needed for wheezing or shortness of breath. Fill as daw. They do have permission to fill either but go ahead and rewrite.  . metoprolol succinate  (TOPROL-XL) 25 MG 24 hr tablet Take by mouth.  . Multiple Vitamin (MULTIVITAMIN) tablet Take 1 tablet by mouth daily.  . SUMAtriptan (IMITREX) 50 MG tablet Take 1 tablet (50 mg total) by mouth every 2 (two) hours as needed for migraine (max 2 doses in 24 hours).  . Vitamin D, Ergocalciferol, (DRISDOL) 1.25 MG (50000 UT) CAPS capsule Take 1 capsule (50,000 Units total) by mouth every 7 (seven) days.   No facility-administered encounter medications on file as of 03/16/2020.     Review of Systems  Review of Systems  Constitutional: Negative.   HENT: Negative.   Respiratory: Positive for cough. Negative for shortness of breath.   Cardiovascular: Negative.   Gastrointestinal: Negative.   Allergic/Immunologic: Negative.   Neurological: Negative.   Psychiatric/Behavioral: Negative.        Physical Exam  BP 125/70   Pulse (!) 58   Temp (!) 96.2 F (35.7 C)   SpO2 100% Comment: RA  Wt Readings from Last 5 Encounters:  03/03/20 128 lb (58.1 kg)  09/07/18 114 lb 3.2 oz (51.8 kg)  08/13/18 113 lb (51.3 kg)  03/02/18 123 lb 6.4 oz (56 kg)  08/11/17 120 lb (54.4 kg)     Physical Exam Vitals and nursing note reviewed.  Constitutional:      General: She is not in acute distress.    Appearance: She is well-developed and well-nourished.  Cardiovascular:     Rate and Rhythm: Normal rate and regular rhythm.  Pulmonary:     Effort: Pulmonary effort is normal.     Breath sounds: Normal breath sounds.  Neurological:     Mental Status: She is alert and oriented to person, place, and time.  Psychiatric:        Mood and Affect: Mood and affect normal.      Lab Results:  CBC    Component Value Date/Time   WBC 3.8 (L) 02/23/2018 0837   RBC 4.38 02/23/2018 0837   HGB 13.5 02/23/2018 0837   HGB 14.2 07/01/2016 0740   HCT 41.2 02/23/2018 0837   HCT 42.3 07/01/2016 0740   PLT 201.0 02/23/2018 0837   PLT 178 07/01/2016 0740   MCV 93.9 02/23/2018 0837   MCV 96 07/01/2016 0740    MCH 32.3 07/01/2016 0740   MCH 32.3 09/10/2015 1523   MCHC 32.9 02/23/2018 0837   RDW 13.1 02/23/2018 0837   RDW 11.8 07/01/2016 0740   LYMPHSABS 1.4 07/01/2016 0740   EOSABS 0.3 07/01/2016 0740   BASOSABS 0.0 07/01/2016 0740    BMET    Component Value Date/Time   NA 141 02/23/2018 0837   NA 141 07/01/2016 0740   K 4.1 02/23/2018 0837   K 3.8 07/01/2016 0740   CL 102 02/23/2018  0837   CL 101 05/28/2015 1435   CO2 29 02/23/2018 0837   CO2 29 07/01/2016 0740   GLUCOSE 76 02/23/2018 0837   GLUCOSE 102 07/01/2016 0740   BUN 11 02/23/2018 0837   BUN 10.7 07/01/2016 0740   CREATININE 0.69 02/23/2018 0837   CREATININE 0.8 07/01/2016 0740   CALCIUM 9.3 02/23/2018 0837   CALCIUM 9.3 07/01/2016 0740   GFRNONAA >60 09/10/2015 1523   GFRAA >60 09/10/2015 1523    BNP No results found for: BNP  ProBNP No results found for: PROBNP  Imaging: DG Chest 2 View  Result Date: 03/11/2020 CLINICAL DATA:  COVID-19 positive 5 days ago, productive cough EXAM: CHEST - 2 VIEW COMPARISON:  09/10/2015 FINDINGS: Frontal and lateral views of the chest demonstrate an unremarkable cardiac silhouette. No acute airspace disease, effusion, or pneumothorax. No acute bony abnormalities. IMPRESSION: 1. No acute intrathoracic process. Electronically Signed   By: Randa Ngo M.D.   On: 03/11/2020 15:15     Assessment & Plan:   History of COVID-19 Cough:   Stay well hydrated  Stay active  Deep breathing exercises  May take tylenol or fever or pain  Will order prednisone   Follow up:  Follow up in 2 weeks or sooner if needed - will need repeat imaging      Fenton Foy, NP 03/16/2020

## 2020-03-16 NOTE — Assessment & Plan Note (Signed)
Cough:   Stay well hydrated  Stay active  Deep breathing exercises  May take tylenol or fever or pain  Will order prednisone   Follow up:  Follow up in 2 weeks or sooner if needed - will need repeat imaging

## 2020-03-16 NOTE — Patient Instructions (Addendum)
Covid 19 Cough:   Stay well hydrated  Stay active  Deep breathing exercises  May take tylenol or fever or pain  Will order prednisone   Follow up:  Follow up in 2 weeks or sooner if needed - will need repeat imaging

## 2020-03-30 ENCOUNTER — Ambulatory Visit (HOSPITAL_BASED_OUTPATIENT_CLINIC_OR_DEPARTMENT_OTHER)
Admission: RE | Admit: 2020-03-30 | Discharge: 2020-03-30 | Disposition: A | Discharge status: Home / Self Care | Payer: BC Managed Care – PPO | Source: Ambulatory Visit | Attending: Nurse Practitioner | Admitting: Nurse Practitioner

## 2020-03-30 ENCOUNTER — Other Ambulatory Visit: Payer: Self-pay

## 2020-03-30 DIAGNOSIS — Z8616 Personal history of COVID-19: Secondary | ICD-10-CM | POA: Diagnosis not present

## 2020-03-30 DIAGNOSIS — R059 Cough, unspecified: Secondary | ICD-10-CM | POA: Diagnosis not present

## 2020-03-31 ENCOUNTER — Ambulatory Visit: Payer: BC Managed Care – PPO

## 2020-03-31 ENCOUNTER — Ambulatory Visit (INDEPENDENT_AMBULATORY_CARE_PROVIDER_SITE_OTHER): Payer: BC Managed Care – PPO | Admitting: Nurse Practitioner

## 2020-03-31 ENCOUNTER — Telehealth: Payer: Self-pay

## 2020-03-31 DIAGNOSIS — Z8616 Personal history of COVID-19: Secondary | ICD-10-CM

## 2020-03-31 MED ORDER — PROMETHAZINE-DM 6.25-15 MG/5ML PO SYRP: 2.5000 mL | ORAL_SOLUTION | Freq: Four times a day (QID) | ORAL | 0 refills | Status: DC | PRN

## 2020-03-31 NOTE — Telephone Encounter (Signed)
Call to patient regarding test results. No answer. Voicemail full and unable to leave message.

## 2020-03-31 NOTE — Progress Notes (Signed)
@Patient  ID: Gail Levine, female    DOB: [birth year], 55 y.o.   MRN: [id number]  Chief Complaint  Patient presents with  . Follow-up  . Cough    nonproductive    Referring provider: Mosie Lukes, MD   HPI  Patient presents today for post COVID care clinic visit. Patient was diagnosed with Covid on 03/06/2020. She was last seen in our office on 03/16/2020. She states that overall she is doing much better. Her breathing has improved and is back to baseline. She does still have slight nonproductive cough. She is trying to stay active. She is eating well and trying to stay well-hydrated. Patient did have a chest x-ray on 03/30/2020 that was clear. Denies f/c/s, n/v/d, hemoptysis, PND, chest pain or edema       Allergies  Allergen Reactions  . Sulfa Antibiotics Anaphylaxis  . Penicillins Rash    Immunization History  Administered Date(s) Administered  . Influenza, Seasonal, Injecte, Preservative Fre 12/26/2006  . Influenza,inj,Quad PF,6+ Mos 09/27/2019  . PFIZER(Purple Top)SARS-COV-2 Vaccination 02/15/2019, 03/08/2019, 11/17/2019  . Tdap 10/28/2016    Past Medical History:  Diagnosis Date  . Acute bronchitis 09/09/2015  . Allergy   . Arthritis   . Asthma   . Asthma 09/09/2015  . Back pain 01/08/2016  . Breast cancer, left breast ([place])    2007   . Cancer ([place])    2007  . Cervical cancer screening 01/01/2015  . Constipation 11/23/2015  . Dermatitis 01/04/2015  . Estrogen deficient vulvovaginitis 09/14/2014  . Gout   . Gout 06/03/2016  . HIV infection ([place])   . HTN (hypertension), benign 11/23/2015  . Hyperlipidemia   . Positive PPD   . Preventative health care 01/20/2017  . Pruritus 09/04/2014  . Thyroid disease   . Vitamin D deficiency     Tobacco History: Social History   Tobacco Use  Smoking Status Never Smoker  Smokeless Tobacco Never Used   Counseling given: Not Answered   Outpatient Encounter Medications as of 03/31/2020  Medication Sig  .  promethazine-dextromethorphan (PROMETHAZINE-DM) 6.25-15 MG/5ML syrup Take 2.5 mLs by mouth 4 (four) times daily as needed for cough.  Marland Kitchen allopurinol (ZYLOPRIM) 100 MG tablet Take 1 tablet (100 mg total) by mouth daily.  Marland Kitchen ALPRAZolam (XANAX) 0.25 MG tablet Take 1 tablet (0.25 mg total) by mouth 2 (two) times daily as needed for anxiety.  . Ascorbic Acid (VITAMIN C) 1000 MG tablet Take 1,000 mg by mouth daily.  Marland Kitchen aspirin EC 81 MG tablet Take 1 tablet (81 mg total) by mouth daily.  Marland Kitchen azithromycin (ZITHROMAX) 250 MG tablet Take 2 tablets by mouth on day 1, followed by 1 tablet by mouth daily for 4 days.  Marland Kitchen b complex vitamins tablet Take 1 tablet by mouth daily.  . benzonatate (TESSALON) 100 MG capsule Take 1 capsule (100 mg total) by mouth 3 (three) times daily as needed for cough.  . calcium carbonate (OS-CAL) 600 MG TABS tablet Take 1,800 mg by mouth 2 (two) times daily with a meal.  . diltiazem (CARDIZEM) 30 MG tablet Take 1 tablet (30 mg total) by mouth 3 (three) times daily.  . diphenhydrAMINE (BENADRYL) 25 MG tablet Take 25 mg by mouth every 6 (six) hours as needed for itching.   . escitalopram (LEXAPRO) 5 MG tablet TAKE 0.5-1 TABLETS (2.5-5 MG TOTAL) BY MOUTH DAILY.  . fluconazole (DIFLUCAN) 150 MG tablet Take 1 tablet (150 mg total) by mouth once a week.  . fluticasone (FLONASE) 50  MCG/ACT nasal spray Place 2 sprays into both nostrils daily.  . hydrochlorothiazide (HYDRODIURIL) 25 MG tablet Take 0.5 tablets (12.5 mg total) by mouth daily as needed. 1/2 tablet by mouth every day  . ketoconazole (NIZORAL) 2 % shampoo Apply 1 application topically 2 (two) times a week.  . levalbuterol (XOPENEX HFA) 45 MCG/ACT inhaler Inhale 1 puff into the lungs every 6 (six) hours as needed for wheezing or shortness of breath. Fill as daw. They do have permission to fill either but go ahead and rewrite.  . metoprolol succinate (TOPROL-XL) 25 MG 24 hr tablet Take by mouth.  . Multiple Vitamin (MULTIVITAMIN) tablet  Take 1 tablet by mouth daily.  . SUMAtriptan (IMITREX) 50 MG tablet Take 1 tablet (50 mg total) by mouth every 2 (two) hours as needed for migraine (max 2 doses in 24 hours).  . Vitamin D, Ergocalciferol, (DRISDOL) 1.25 MG (50000 UT) CAPS capsule Take 1 capsule (50,000 Units total) by mouth every 7 (seven) days.   No facility-administered encounter medications on file as of 03/31/2020.     Review of Systems  Review of Systems  Constitutional: Negative.  Negative for fatigue and fever.  HENT: Negative.   Respiratory: Negative for cough and shortness of breath.   Cardiovascular: Negative.  Negative for chest pain, palpitations and leg swelling.  Gastrointestinal: Negative.   Allergic/Immunologic: Negative.   Neurological: Negative.   Psychiatric/Behavioral: Negative.        Physical Exam  BP (!) 105/57 (BP Location: Right Arm, Patient Position: Sitting)   Pulse 60   Temp (!) 96.9 F (36.1 C) (Skin)   Resp 16   Wt 127 lb 0.6 oz (57.6 kg)   SpO2 100%   BMI 24.81 kg/m   Wt Readings from Last 5 Encounters:  03/31/20 127 lb 0.6 oz (57.6 kg)  03/03/20 128 lb (58.1 kg)  09/07/18 114 lb 3.2 oz (51.8 kg)  08/13/18 113 lb (51.3 kg)  03/02/18 123 lb 6.4 oz (56 kg)     Physical Exam Vitals and nursing note reviewed.  Constitutional:      General: She is not in acute distress.    Appearance: She is well-developed and well-nourished.  Cardiovascular:     Rate and Rhythm: Normal rate and regular rhythm.  Pulmonary:     Effort: Pulmonary effort is normal.     Breath sounds: Normal breath sounds.  Musculoskeletal:     Right lower leg: No edema.     Left lower leg: No edema.  Neurological:     Mental Status: She is alert and oriented to person, place, and time.  Psychiatric:        Mood and Affect: Mood and affect and mood normal.        Behavior: Behavior normal.       Imaging: DG Chest 2 View  Result Date: 03/31/2020 CLINICAL DATA:  COVID 03/06/2020.  Cough. EXAM:  CHEST - 2 VIEW COMPARISON:  Chest radiograph 03/11/2020 FINDINGS: The cardiomediastinal contours are normal. Pulmonary vasculature is normal. No consolidation, pleural effusion, or pneumothorax. No acute osseous abnormalities are seen. IMPRESSION: No acute chest findings. No radiographic evidence of COVID-19 sequela. Electronically Signed   By: Keith Rake M.D.   On: 03/31/2020 11:52   DG Chest 2 View  Result Date: 03/11/2020 CLINICAL DATA:  COVID-19 positive 5 days ago, productive cough EXAM: CHEST - 2 VIEW COMPARISON:  09/10/2015 FINDINGS: Frontal and lateral views of the chest demonstrate an unremarkable cardiac silhouette. No acute airspace disease,  effusion, or pneumothorax. No acute bony abnormalities. IMPRESSION: 1. No acute intrathoracic process. Electronically Signed   By: Randa Ngo M.D.   On: 03/11/2020 15:15     Assessment & Plan:   History of COVID-19 Cough:   Stay well hydrated  Stay active  Deep breathing exercises  May take tylenol or fever or pain  May take delsym twice daily  Will order cough syrup    Follow up:  Follow up if needed      Fenton Foy, NP 04/02/2020

## 2020-03-31 NOTE — Patient Instructions (Signed)
Covid 19 Cough:   Stay well hydrated  Stay active  Deep breathing exercises  May take tylenol or fever or pain  May take delsym twice daily  Will order cough syrup    Follow up:  Follow up if needed

## 2020-04-02 NOTE — Assessment & Plan Note (Signed)
Cough:   Stay well hydrated  Stay active  Deep breathing exercises  May take tylenol or fever or pain  May take delsym twice daily  Will order cough syrup    Follow up:  Follow up if needed

## 2020-04-17 DIAGNOSIS — J4540 Moderate persistent asthma, uncomplicated: Secondary | ICD-10-CM | POA: Diagnosis not present

## 2020-04-17 DIAGNOSIS — R079 Chest pain, unspecified: Secondary | ICD-10-CM | POA: Diagnosis not present

## 2020-04-17 DIAGNOSIS — Z23 Encounter for immunization: Secondary | ICD-10-CM | POA: Diagnosis not present

## 2020-04-17 DIAGNOSIS — R002 Palpitations: Secondary | ICD-10-CM | POA: Diagnosis not present

## 2020-06-01 ENCOUNTER — Encounter (HOSPITAL_BASED_OUTPATIENT_CLINIC_OR_DEPARTMENT_OTHER): Payer: Self-pay

## 2020-06-01 ENCOUNTER — Ambulatory Visit (HOSPITAL_BASED_OUTPATIENT_CLINIC_OR_DEPARTMENT_OTHER)
Admission: RE | Admit: 2020-06-01 | Discharge: 2020-06-01 | Disposition: A | Discharge status: Home / Self Care | Payer: BC Managed Care – PPO | Source: Ambulatory Visit | Attending: Family Medicine | Admitting: Family Medicine

## 2020-06-01 ENCOUNTER — Other Ambulatory Visit: Payer: Self-pay

## 2020-06-01 DIAGNOSIS — Z1231 Encounter for screening mammogram for malignant neoplasm of breast: Secondary | ICD-10-CM | POA: Diagnosis not present

## 2020-06-22 ENCOUNTER — Other Ambulatory Visit: Payer: Self-pay

## 2020-06-22 ENCOUNTER — Ambulatory Visit (INDEPENDENT_AMBULATORY_CARE_PROVIDER_SITE_OTHER): Payer: BC Managed Care – PPO | Admitting: Family Medicine

## 2020-06-22 ENCOUNTER — Encounter: Payer: Self-pay | Admitting: Family Medicine

## 2020-06-22 ENCOUNTER — Other Ambulatory Visit: Payer: Self-pay | Admitting: Family Medicine

## 2020-06-22 VITALS — BP 121/55 | HR 54 | Temp 98.6°F | Resp 12 | Ht 60.0 in | Wt 121.6 lb

## 2020-06-22 DIAGNOSIS — M109 Gout, unspecified: Secondary | ICD-10-CM

## 2020-06-22 DIAGNOSIS — Z1159 Encounter for screening for other viral diseases: Secondary | ICD-10-CM

## 2020-06-22 DIAGNOSIS — J45909 Unspecified asthma, uncomplicated: Secondary | ICD-10-CM

## 2020-06-22 DIAGNOSIS — R739 Hyperglycemia, unspecified: Secondary | ICD-10-CM | POA: Diagnosis not present

## 2020-06-22 DIAGNOSIS — Z0000 Encounter for general adult medical examination without abnormal findings: Secondary | ICD-10-CM | POA: Diagnosis not present

## 2020-06-22 DIAGNOSIS — E782 Mixed hyperlipidemia: Secondary | ICD-10-CM

## 2020-06-22 DIAGNOSIS — E559 Vitamin D deficiency, unspecified: Secondary | ICD-10-CM | POA: Diagnosis not present

## 2020-06-22 DIAGNOSIS — I10 Essential (primary) hypertension: Secondary | ICD-10-CM

## 2020-06-22 DIAGNOSIS — U071 COVID-19: Secondary | ICD-10-CM | POA: Insufficient documentation

## 2020-06-22 MED ORDER — ALLOPURINOL 100 MG PO TABS: 100.0000 mg | ORAL_TABLET | Freq: Every day | ORAL | 1 refills | Status: DC | PRN

## 2020-06-22 MED ORDER — ESCITALOPRAM OXALATE 5 MG PO TABS: 5.0000 mg | ORAL_TABLET | Freq: Every day | ORAL | 1 refills | Status: DC

## 2020-06-22 NOTE — Assessment & Plan Note (Signed)
Encouraged heart healthy diet, increase exercise, avoid trans fats, consider a krill oil cap daily 

## 2020-06-22 NOTE — Assessment & Plan Note (Signed)
She really had a hard time with COVID but she is slowly improving.

## 2020-06-22 NOTE — Patient Instructions (Signed)
Preventive Care 53-53 Years Old, Female Preventive care refers to lifestyle choices and visits with your health care provider that can promote health and wellness. This includes:  A yearly physical exam. This is also called an annual wellness visit.  Regular dental and eye exams.  Immunizations.  Screening for certain conditions.  Healthy lifestyle choices, such as: ? Eating a healthy diet. ? Getting regular exercise. ? Not using drugs or products that contain nicotine and tobacco. ? Limiting alcohol use. What can I expect for my preventive care visit? Physical exam Your health care provider will check your:  Height and weight. These may be used to calculate your BMI (body mass index). BMI is a measurement that tells if you are at a healthy weight.  Heart rate and blood pressure.  Body temperature.  Skin for abnormal spots. Counseling Your health care provider may ask you questions about your:  Past medical problems.  Family's medical history.  Alcohol, tobacco, and drug use.  Emotional well-being.  Home life and relationship well-being.  Sexual activity.  Diet, exercise, and sleep habits.  Work and work Statistician.  Access to firearms.  Method of birth control.  Menstrual cycle.  Pregnancy history. What immunizations do I need? Vaccines are usually given at various ages, according to a schedule. Your health care provider will recommend vaccines for you based on your age, medical history, and lifestyle or other factors, such as travel or where you work.   What tests do I need? Blood tests  Lipid and cholesterol levels. These may be checked every 5 years, or more often if you are over 3 years old.  Hepatitis C test.  Hepatitis B test. Screening  Lung cancer screening. You may have this screening every year starting at age 73 if you have a 30-pack-year history of smoking and currently smoke or have quit within the past 15 years.  Colorectal cancer  screening. ? All adults should have this screening starting at age 52 and continuing until age 17. ? Your health care provider may recommend screening at age 49 if you are at increased risk. ? You will have tests every 1-10 years, depending on your results and the type of screening test.  Diabetes screening. ? This is done by checking your blood sugar (glucose) after you have not eaten for a while (fasting). ? You may have this done every 1-3 years.  Mammogram. ? This may be done every 1-2 years. ? Talk with your health care provider about when you should start having regular mammograms. This may depend on whether you have a family history of breast cancer.  BRCA-related cancer screening. This may be done if you have a family history of breast, ovarian, tubal, or peritoneal cancers.  Pelvic exam and Pap test. ? This may be done every 3 years starting at age 10. ? Starting at age 11, this may be done every 5 years if you have a Pap test in combination with an HPV test. Other tests  STD (sexually transmitted disease) testing, if you are at risk.  Bone density scan. This is done to screen for osteoporosis. You may have this scan if you are at high risk for osteoporosis. Talk with your health care provider about your test results, treatment options, and if necessary, the need for more tests. Follow these instructions at home: Eating and drinking  Eat a diet that includes fresh fruits and vegetables, whole grains, lean protein, and low-fat dairy products.  Take vitamin and mineral supplements  as recommended by your health care provider.  Do not drink alcohol if: ? Your health care provider tells you not to drink. ? You are pregnant, may be pregnant, or are planning to become pregnant.  If you drink alcohol: ? Limit how much you have to 0-1 drink a day. ? Be aware of how much alcohol is in your drink. In the [place] one drink equals one 12 oz bottle of beer (355 mL), one 5 oz glass of  wine (148 mL), or one 1 oz glass of hard liquor (44 mL).   Lifestyle  Take daily care of your teeth and gums. Brush your teeth every morning and night with fluoride toothpaste. Floss one time each day.  Stay active. Exercise for at least 30 minutes 5 or more days each week.  Do not use any products that contain nicotine or tobacco, such as cigarettes, e-cigarettes, and chewing tobacco. If you need help quitting, ask your health care provider.  Do not use drugs.  If you are sexually active, practice safe sex. Use a condom or other form of protection to prevent STIs (sexually transmitted infections).  If you do not wish to become pregnant, use a form of birth control. If you plan to become pregnant, [place] your health care provider for a prepregnancy visit.  If told by your health care provider, take low-dose aspirin daily starting at age 50.  Find healthy ways to cope with stress, such as: ? Meditation, yoga, or listening to music. ? Journaling. ? Talking to a trusted person. ? Spending time with friends and family. Safety  Always wear your seat belt while driving or riding in a vehicle.  Do not drive: ? If you have been drinking alcohol. Do not ride with someone who has been drinking. ? When you are tired or distracted. ? While texting.  Wear a helmet and other protective equipment during sports activities.  If you have firearms in your house, make sure you follow all gun safety procedures. What's next?  Visit your health care provider once a year for an annual wellness visit.  Ask your health care provider how often you should have your eyes and teeth checked.  Stay up to date on all vaccines. This information is not intended to replace advice given to you by your health care provider. Make sure you discuss any questions you have with your health care provider. Document Revised: 10/22/2019 Document Reviewed: 09/28/2017 Elsevier Patient Education  2021 [employer]vier Inc.  

## 2020-06-22 NOTE — Assessment & Plan Note (Signed)
She was very sick when she had covid due to her asthma but she is better now.

## 2020-06-22 NOTE — Assessment & Plan Note (Signed)
Supplement and monitor 

## 2020-06-22 NOTE — Progress Notes (Signed)
Subjective:    Patient ID: Gail Levine, female    DOB: 1965-10-29, 55 y.o.   MRN: [id number]  Chief Complaint  Patient presents with  . Annual Exam    HPI Patient is in today for annual preventative exam and follow up on chronic medical concerns including hypertension, hyperglycemia and more. No recent febrile illness or hospitalizations. Gail Levine had covd in February and Gail Levine continues to struggle with sob, cough but they are improving. Gail Levine continues to work full time. Gail Levine denies any acute concerns. Gail Levine tries to maintain a heart healthy diet and to stay active. No complaints of polyuria or polydipsia. Denies CP/palp/SOB/HA/congestion/fevers/GI or GU c/o. Taking meds as prescribed  Past Medical History:  Diagnosis Date  . Acute bronchitis 09/09/2015  . Allergy   . Arthritis   . Asthma   . Asthma 09/09/2015  . Back pain 01/08/2016  . Breast cancer, left breast ([place]) 2007   Left Breast Cancer  . Cancer ([place]) 2007  . Cervical cancer screening 01/01/2015  . Constipation 11/23/2015  . Dermatitis 01/04/2015  . Estrogen deficient vulvovaginitis 09/14/2014  . Gout   . Gout 06/03/2016  . HIV infection ([place])   . HTN (hypertension), benign 11/23/2015  . Hyperlipidemia   . Positive PPD   . Preventative health care 01/20/2017  . Pruritus 09/04/2014  . Thyroid disease   . Vitamin D deficiency     Past Surgical History:  Procedure Laterality Date  . MASTECTOMY Left     Family History  Problem Relation Age of Onset  . Cancer Mother        cervical cancer  . Hypertension Father   . Stroke Father   . Heart disease Father   . Diabetes Father   . Pulmonary fibrosis Father   . Cancer Sister        cervical cancer    Social History   Socioeconomic History  . Marital status: Single    Spouse name: Not on file  . Number of children: Not on file  . Years of education: Not on file  . Highest education level: Not on file  Occupational History  . Occupation: patient care coordinator  Tobacco Use   . Smoking status: Never Smoker  . Smokeless tobacco: Never Used  Substance and Sexual Activity  . Alcohol use: Yes    Alcohol/week: 0.0 standard drinks  . Drug use: No  . Sexual activity: Never  Other Topics Concern  . Not on file  Social History Narrative  . Not on file   Social Determinants of Health   Financial Resource Strain: Not on file  Food Insecurity: Not on file  Transportation Needs: Not on file  Physical Activity: Not on file  Stress: Not on file  Social Connections: Not on file  Intimate Partner Violence: Not on file    Outpatient Medications Prior to Visit  Medication Sig Dispense Refill  . ALPRAZolam (XANAX) 0.25 MG tablet Take 1 tablet (0.25 mg total) by mouth 2 (two) times daily as needed for anxiety. 20 tablet 2  . Ascorbic Acid (VITAMIN C) 1000 MG tablet Take 1,000 mg by mouth daily.    Marland Kitchen aspirin EC 81 MG tablet Take 1 tablet (81 mg total) by mouth daily.    Marland Kitchen b complex vitamins tablet Take 1 tablet by mouth daily.    . benzonatate (TESSALON) 100 MG capsule Take 1 capsule (100 mg total) by mouth 3 (three) times daily as needed for cough. 30 capsule 0  . calcium  carbonate (OS-CAL) 600 MG TABS tablet Take 1,800 mg by mouth 2 (two) times daily with a meal.    . diltiazem (CARDIZEM) 30 MG tablet Take 1 tablet (30 mg total) by mouth 3 (three) times daily.    . diphenhydrAMINE (BENADRYL) 25 MG tablet Take 25 mg by mouth every 6 (six) hours as needed for itching.     . fluticasone (FLONASE) 50 MCG/ACT nasal spray Place 2 sprays into both nostrils daily. 16 g 1  . hydrochlorothiazide (HYDRODIURIL) 25 MG tablet Take 0.5 tablets (12.5 mg total) by mouth daily as needed. 1/2 tablet by mouth every day    . ketoconazole (NIZORAL) 2 % shampoo Apply 1 application topically 2 (two) times a week. 120 mL 1  . levalbuterol (XOPENEX HFA) 45 MCG/ACT inhaler Inhale 1 puff into the lungs every 6 (six) hours as needed for wheezing or shortness of breath. Fill as daw. They do have  permission to fill either but go ahead and rewrite. 1 Inhaler 5  . Multiple Vitamin (MULTIVITAMIN) tablet Take 1 tablet by mouth daily.    . promethazine-dextromethorphan (PROMETHAZINE-DM) 6.25-15 MG/5ML syrup Take 2.5 mLs by mouth 4 (four) times daily as needed for cough. 118 mL 0  . SUMAtriptan (IMITREX) 50 MG tablet Take 1 tablet (50 mg total) by mouth every 2 (two) hours as needed for migraine (max 2 doses in 24 hours). 10 tablet 1  . Vitamin D, Ergocalciferol, (DRISDOL) 1.25 MG (50000 UT) CAPS capsule Take 1 capsule (50,000 Units total) by mouth every 7 (seven) days. 4 capsule 5  . allopurinol (ZYLOPRIM) 100 MG tablet Take 1 tablet (100 mg total) by mouth daily. 90 tablet 1  . azithromycin (ZITHROMAX) 250 MG tablet Take 2 tablets by mouth on day 1, followed by 1 tablet by mouth daily for 4 days. 6 tablet 0  . escitalopram (LEXAPRO) 5 MG tablet TAKE 0.5-1 TABLETS (2.5-5 MG TOTAL) BY MOUTH DAILY. 90 tablet 1  . fluconazole (DIFLUCAN) 150 MG tablet Take 1 tablet (150 mg total) by mouth once a week. 2 tablet 0  . metoprolol succinate (TOPROL-XL) 25 MG 24 hr tablet Take by mouth.     No facility-administered medications prior to visit.    Allergies  Allergen Reactions  . Sulfa Antibiotics Anaphylaxis  . Penicillins Rash    Review of Systems  Constitutional: Negative for chills, fever and malaise/fatigue.  HENT: Positive for congestion. Negative for hearing loss.   Eyes: Negative for discharge.  Respiratory: Positive for cough. Negative for sputum production.   Cardiovascular: Negative for chest pain, palpitations and leg swelling.  Gastrointestinal: Negative for abdominal pain, blood in stool, constipation, diarrhea, heartburn, nausea and vomiting.  Genitourinary: Negative for dysuria, frequency, hematuria and urgency.  Musculoskeletal: Negative for back pain, falls and myalgias.  Skin: Negative for rash.  Neurological: Negative for dizziness, sensory change, loss of consciousness,  weakness and headaches.  Endo/Heme/Allergies: Negative for environmental allergies. Does not bruise/bleed easily.  Psychiatric/Behavioral: Negative for depression and suicidal ideas. The patient is not nervous/anxious and does not have insomnia.        Objective:    Physical Exam Constitutional:      General: Gail Levine is not in acute distress.    Appearance: Gail Levine is well-developed.  HENT:     Head: Normocephalic and atraumatic.  Eyes:     Conjunctiva/sclera: Conjunctivae normal.  Neck:     Thyroid: No thyromegaly.  Cardiovascular:     Rate and Rhythm: Normal rate and regular rhythm.  Heart sounds: Normal heart sounds. No murmur heard.   Pulmonary:     Effort: Pulmonary effort is normal. No respiratory distress.     Breath sounds: Normal breath sounds.  Abdominal:     General: Bowel sounds are normal. There is no distension.     Palpations: Abdomen is soft. There is no mass.     Tenderness: There is no abdominal tenderness.  Musculoskeletal:     Cervical back: Neck supple.  Lymphadenopathy:     Cervical: No cervical adenopathy.  Skin:    General: Skin is warm and dry.  Neurological:     Mental Status: Gail Levine is alert and oriented to person, place, and time.  Psychiatric:        Behavior: Behavior normal.     BP (!) 121/55 (BP Location: Right Arm, Cuff Size: Normal)   Pulse (!) 54   Temp 98.6 F (37 C) (Oral)   Resp 12   Ht 5' (1.524 m)   Wt 121 lb 9.6 oz (55.2 kg)   SpO2 99%   BMI 23.75 kg/m  Wt Readings from Last 3 Encounters:  06/22/20 121 lb 9.6 oz (55.2 kg)  03/31/20 127 lb 0.6 oz (57.6 kg)  03/03/20 128 lb (58.1 kg)    Diabetic Foot Exam - Simple   No data filed    Lab Results  Component Value Date   WBC 3.8 (L) 02/23/2018   HGB 13.5 02/23/2018   HCT 41.2 02/23/2018   PLT 201.0 02/23/2018   GLUCOSE 76 02/23/2018   CHOL 197 02/23/2018   TRIG 61.0 02/23/2018   HDL 67.90 02/23/2018   LDLCALC 117 (H) 02/23/2018   ALT 9 02/23/2018   AST 19  02/23/2018   NA 141 02/23/2018   K 4.1 02/23/2018   CL 102 02/23/2018   CREATININE 0.69 02/23/2018   BUN 11 02/23/2018   CO2 29 02/23/2018   TSH 4.37 02/23/2018   HGBA1C 5.0 02/23/2018    Lab Results  Component Value Date   TSH 4.37 02/23/2018   Lab Results  Component Value Date   WBC 3.8 (L) 02/23/2018   HGB 13.5 02/23/2018   HCT 41.2 02/23/2018   MCV 93.9 02/23/2018   PLT 201.0 02/23/2018   Lab Results  Component Value Date   NA 141 02/23/2018   K 4.1 02/23/2018   CHLORIDE 104 07/01/2016   CO2 29 02/23/2018   GLUCOSE 76 02/23/2018   BUN 11 02/23/2018   CREATININE 0.69 02/23/2018   BILITOT 0.8 02/23/2018   ALKPHOS 61 02/23/2018   AST 19 02/23/2018   ALT 9 02/23/2018   PROT 7.6 02/23/2018   ALBUMIN 4.2 02/23/2018   CALCIUM 9.3 02/23/2018   ANIONGAP 7 07/01/2016   EGFR 90 (L) 07/01/2016   GFR 89.97 02/23/2018   Lab Results  Component Value Date   CHOL 197 02/23/2018   Lab Results  Component Value Date   HDL 67.90 02/23/2018   Lab Results  Component Value Date   LDLCALC 117 (H) 02/23/2018   Lab Results  Component Value Date   TRIG 61.0 02/23/2018   Lab Results  Component Value Date   CHOLHDL 3 02/23/2018   Lab Results  Component Value Date   HGBA1C 5.0 02/23/2018       Assessment & Plan:   Problem List Items Addressed This Visit    Vitamin D deficiency    Supplement and monitor      Hyperlipidemia    Encouraged heart healthy diet, increase exercise, avoid  trans fats, consider a krill oil cap daily      Asthma    Gail Levine really had a hard time with COVID but Gail Levine is slowly improving.      Gout    Hydrate and monitor symptoms and labs      Preventative health care - Primary    Patient encouraged to maintain heart healthy diet, regular exercise, adequate sleep. Consider daily probiotics. Take medications as prescribed. Labs ordered and reviewed. Pap due in 2023. MGM ordered. Gail Levine is considering colonoscopy but is going to wait until her  sister agrees then Gail Levine will proceed. Gail Levine has decided not to do iFOB or Cologuard. Gail Levine had the first Shingrix shot 06/05/20 will repeat in the summer. Gail Levine has had all four COVID shots.       Relevant Orders   Hemoglobin A1c   CBC with Differential/Platelet   Comprehensive metabolic panel   Lipid panel   TSH   Hyperglycemia    hgba1c acceptable, minimize simple carbs. Increase exercise as tolerated.       COVID-19    Was sick in February and has been following with the post Covid clinic. Gail Levine continues to improve.         I have discontinued Kaleea Arbaugh's azithromycin. I have also changed her escitalopram. Additionally, I am having her maintain her multivitamin, vitamin C, calcium carbonate, b complex vitamins, diphenhydrAMINE, diltiazem, aspirin EC, hydrochlorothiazide, Vitamin D (Ergocalciferol), levalbuterol, ALPRAZolam, ketoconazole, SUMAtriptan, metoprolol succinate, fluticasone, benzonatate, and promethazine-dextromethorphan.  Meds ordered this encounter  Medications  . escitalopram (LEXAPRO) 5 MG tablet    Sig: Take 1 tablet (5 mg total) by mouth daily.    Dispense:  90 tablet    Refill:  1     Penni Homans, MD

## 2020-06-22 NOTE — Progress Notes (Incomplete)
Subjective:    Patient ID: Gail Levine, female    DOB: September 27, 1965, 55 y.o.   MRN: [id number]  No chief complaint on file.   HPI Patient is in today for ***  Past Medical History:  Diagnosis Date  . Acute bronchitis 09/09/2015  . Allergy   . Arthritis   . Asthma   . Asthma 09/09/2015  . Back pain 01/08/2016  . Breast cancer, left breast ([place]) 2007   Left Breast Cancer  . Cancer ([place]) 2007  . Cervical cancer screening 01/01/2015  . Constipation 11/23/2015  . Dermatitis 01/04/2015  . Estrogen deficient vulvovaginitis 09/14/2014  . Gout   . Gout 06/03/2016  . HIV infection ([place])   . HTN (hypertension), benign 11/23/2015  . Hyperlipidemia   . Positive PPD   . Preventative health care 01/20/2017  . Pruritus 09/04/2014  . Thyroid disease   . Vitamin D deficiency     Past Surgical History:  Procedure Laterality Date  . MASTECTOMY Left     Family History  Problem Relation Age of Onset  . Cancer Mother        cervical cancer  . Hypertension Father   . Stroke Father   . Heart disease Father   . Diabetes Father   . Pulmonary fibrosis Father   . Cancer Sister        cervical cancer    Social History   Socioeconomic History  . Marital status: Single    Spouse name: Not on file  . Number of children: Not on file  . Years of education: Not on file  . Highest education level: Not on file  Occupational History  . Occupation: patient care coordinator  Tobacco Use  . Smoking status: Never Smoker  . Smokeless tobacco: Never Used  Substance and Sexual Activity  . Alcohol use: Yes    Alcohol/week: 0.0 standard drinks  . Drug use: No  . Sexual activity: Never  Other Topics Concern  . Not on file  Social History Narrative  . Not on file   Social Determinants of Health   Financial Resource Strain: Not on file  Food Insecurity: Not on file  Transportation Needs: Not on file  Physical Activity: Not on file  Stress: Not on file  Social Connections: Not on file  Intimate  Partner Violence: Not on file    Outpatient Medications Prior to Visit  Medication Sig Dispense Refill  . ALPRAZolam (XANAX) 0.25 MG tablet Take 1 tablet (0.25 mg total) by mouth 2 (two) times daily as needed for anxiety. 20 tablet 2  . Ascorbic Acid (VITAMIN C) 1000 MG tablet Take 1,000 mg by mouth daily.    Marland Kitchen aspirin EC 81 MG tablet Take 1 tablet (81 mg total) by mouth daily.    Marland Kitchen b complex vitamins tablet Take 1 tablet by mouth daily.    . benzonatate (TESSALON) 100 MG capsule Take 1 capsule (100 mg total) by mouth 3 (three) times daily as needed for cough. 30 capsule 0  . calcium carbonate (OS-CAL) 600 MG TABS tablet Take 1,800 mg by mouth 2 (two) times daily with a meal.    . diltiazem (CARDIZEM) 30 MG tablet Take 1 tablet (30 mg total) by mouth 3 (three) times daily.    . diphenhydrAMINE (BENADRYL) 25 MG tablet Take 25 mg by mouth every 6 (six) hours as needed for itching.     . escitalopram (LEXAPRO) 5 MG tablet TAKE 0.5-1 TABLETS (2.5-5 MG TOTAL) BY MOUTH DAILY. [place]  tablet 1  . fluticasone (FLONASE) 50 MCG/ACT nasal spray Place 2 sprays into both nostrils daily. 16 g 1  . hydrochlorothiazide (HYDRODIURIL) 25 MG tablet Take 0.5 tablets (12.5 mg total) by mouth daily as needed. 1/2 tablet by mouth every day    . ketoconazole (NIZORAL) 2 % shampoo Apply 1 application topically 2 (two) times a week. 120 mL 1  . levalbuterol (XOPENEX HFA) 45 MCG/ACT inhaler Inhale 1 puff into the lungs every 6 (six) hours as needed for wheezing or shortness of breath. Fill as daw. They do have permission to fill either but go ahead and rewrite. 1 Inhaler 5  . metoprolol succinate (TOPROL-XL) 25 MG 24 hr tablet Take by mouth.    . Multiple Vitamin (MULTIVITAMIN) tablet Take 1 tablet by mouth daily.    . promethazine-dextromethorphan (PROMETHAZINE-DM) 6.25-15 MG/5ML syrup Take 2.5 mLs by mouth 4 (four) times daily as needed for cough. 118 mL 0  . SUMAtriptan (IMITREX) 50 MG tablet Take 1 tablet (50 mg total) by  mouth every 2 (two) hours as needed for migraine (max 2 doses in 24 hours). 10 tablet 1  . Vitamin D, Ergocalciferol, (DRISDOL) 1.25 MG (50000 UT) CAPS capsule Take 1 capsule (50,000 Units total) by mouth every 7 (seven) days. 4 capsule 5  . allopurinol (ZYLOPRIM) 100 MG tablet Take 1 tablet (100 mg total) by mouth daily. 90 tablet 1  . fluconazole (DIFLUCAN) 150 MG tablet Take 1 tablet (150 mg total) by mouth once a week. 2 tablet 0   No facility-administered medications prior to visit.    Allergies  Allergen Reactions  . Sulfa Antibiotics Anaphylaxis  . Penicillins Rash    Review of Systems  Constitutional: Negative for chills, fever and malaise/fatigue.  HENT: Negative for congestion and hearing loss.   Eyes: Negative for discharge.  Respiratory: Negative for cough, sputum production and shortness of breath.   Cardiovascular: Negative for chest pain, palpitations and leg swelling.  Gastrointestinal: Negative for abdominal pain, blood in stool, constipation, diarrhea, heartburn, nausea and vomiting.  Genitourinary: Negative for dysuria, frequency, hematuria and urgency.  Musculoskeletal: Negative for back pain, falls and myalgias.  Skin: Negative for rash.  Neurological: Negative for dizziness, sensory change, loss of consciousness, weakness and headaches.  Endo/Heme/Allergies: Negative for environmental allergies. Does not bruise/bleed easily.  Psychiatric/Behavioral: Negative for depression and suicidal ideas. The patient is not nervous/anxious and does not have insomnia.        Objective:    Physical Exam Constitutional:      General: She is not in acute distress.    Appearance: She is not diaphoretic.  HENT:     Head: Normocephalic and atraumatic.     Right Ear: External ear normal.     Left Ear: External ear normal.     Nose: Nose normal.     Mouth/Throat:     Pharynx: No oropharyngeal exudate.  Eyes:     General: No scleral icterus.       Right eye: No discharge.         Left eye: No discharge.     Conjunctiva/sclera: Conjunctivae normal.     Pupils: Pupils are equal, round, and reactive to light.  Neck:     Thyroid: No thyromegaly.  Cardiovascular:     Rate and Rhythm: Normal rate and regular rhythm.     Heart sounds: Normal heart sounds. No murmur heard.   Pulmonary:     Effort: Pulmonary effort is normal. No respiratory distress.  Breath sounds: Normal breath sounds. No wheezing or rales.  Abdominal:     General: Bowel sounds are normal. There is no distension.     Palpations: Abdomen is soft. There is no mass.     Tenderness: There is no abdominal tenderness.  Musculoskeletal:        General: No tenderness. Normal range of motion.     Cervical back: Normal range of motion and neck supple.  Lymphadenopathy:     Cervical: No cervical adenopathy.  Skin:    General: Skin is warm and dry.     Findings: No rash.  Neurological:     Mental Status: She is alert and oriented to person, place, and time.     Cranial Nerves: No cranial nerve deficit.     Coordination: Coordination normal.     Deep Tendon Reflexes: Reflexes are normal and symmetric. Reflexes normal.     There were no vitals taken for this visit. Wt Readings from Last 3 Encounters:  06/22/20 121 lb 9.6 oz (55.2 kg)  03/31/20 127 lb 0.6 oz (57.6 kg)  03/03/20 128 lb (58.1 kg)    Diabetic Foot Exam - Simple   No data filed    Lab Results  Component Value Date   WBC 3.8 (L) 02/23/2018   HGB 13.5 02/23/2018   HCT 41.2 02/23/2018   PLT 201.0 02/23/2018   GLUCOSE 76 02/23/2018   CHOL 197 02/23/2018   TRIG 61.0 02/23/2018   HDL 67.90 02/23/2018   LDLCALC 117 (H) 02/23/2018   ALT 9 02/23/2018   AST 19 02/23/2018   NA 141 02/23/2018   K 4.1 02/23/2018   CL 102 02/23/2018   CREATININE 0.69 02/23/2018   BUN 11 02/23/2018   CO2 29 02/23/2018   TSH 4.37 02/23/2018   HGBA1C 5.0 02/23/2018    Lab Results  Component Value Date   TSH 4.37 02/23/2018   Lab Results   Component Value Date   WBC 3.8 (L) 02/23/2018   HGB 13.5 02/23/2018   HCT 41.2 02/23/2018   MCV 93.9 02/23/2018   PLT 201.0 02/23/2018   Lab Results  Component Value Date   NA 141 02/23/2018   K 4.1 02/23/2018   CHLORIDE 104 07/01/2016   CO2 29 02/23/2018   GLUCOSE 76 02/23/2018   BUN 11 02/23/2018   CREATININE 0.69 02/23/2018   BILITOT 0.8 02/23/2018   ALKPHOS 61 02/23/2018   AST 19 02/23/2018   ALT 9 02/23/2018   PROT 7.6 02/23/2018   ALBUMIN 4.2 02/23/2018   CALCIUM 9.3 02/23/2018   ANIONGAP 7 07/01/2016   EGFR 90 (L) 07/01/2016   GFR 89.97 02/23/2018   Lab Results  Component Value Date   CHOL 197 02/23/2018   Lab Results  Component Value Date   HDL 67.90 02/23/2018   Lab Results  Component Value Date   LDLCALC 117 (H) 02/23/2018   Lab Results  Component Value Date   TRIG 61.0 02/23/2018   Lab Results  Component Value Date   CHOLHDL 3 02/23/2018   Lab Results  Component Value Date   HGBA1C 5.0 02/23/2018       Assessment & Plan:   Problem List Items Addressed This Visit    Vitamin D deficiency    Supplement and monitor      Hyperlipidemia - Primary    Encouraged heart healthy diet, increase exercise, avoid trans fats, consider a krill oil cap daily      Relevant Orders   Lipid panel  Asthma    She was very sick when she had covid due to her asthma but she is better now.       HTN (hypertension), benign   Relevant Orders   CBC with Differential/Platelet   Comprehensive metabolic panel   TSH   Gout   Relevant Orders   Uric acid   Hyperglycemia   Relevant Orders   Hemoglobin A1c      I have discontinued Gowri Orndoff's fluconazole. I have also changed her allopurinol. Additionally, I am having her maintain her multivitamin, vitamin C, calcium carbonate, b complex vitamins, diphenhydrAMINE, diltiazem, aspirin EC, hydrochlorothiazide, Vitamin D (Ergocalciferol), levalbuterol, ALPRAZolam, ketoconazole, SUMAtriptan, escitalopram, metoprolol  succinate, fluticasone, benzonatate, and promethazine-dextromethorphan.  Meds ordered this encounter  Medications  . allopurinol (ZYLOPRIM) 100 MG tablet    Sig: Take 1 tablet (100 mg total) by mouth daily as needed.    Dispense:  90 tablet    Refill:  1     Penni Homans, MD

## 2020-06-22 NOTE — Assessment & Plan Note (Signed)
Hydrate and monitor symptoms and labs

## 2020-06-22 NOTE — Assessment & Plan Note (Signed)
hgba1c acceptable, minimize simple carbs. Increase exercise as tolerated.  

## 2020-06-22 NOTE — Assessment & Plan Note (Signed)
Patient encouraged to maintain heart healthy diet, regular exercise, adequate sleep. Consider daily probiotics. Take medications as prescribed. Labs ordered and reviewed. Pap due in 2023. MGM ordered. She is considering colonoscopy but is going to wait until her sister agrees then she will proceed. She has decided not to do iFOB or Cologuard. She had the first Shingrix shot 06/05/20 will repeat in the summer. She has had all four COVID shots.

## 2020-06-22 NOTE — Assessment & Plan Note (Signed)
Was sick in February and has been following with the post Covid clinic. She continues to improve.

## 2020-06-23 LAB — COMPREHENSIVE METABOLIC PANEL
ALT: 11 U/L (ref 0–35)
AST: 22 U/L (ref 0–37)
Albumin: 4.4 g/dL (ref 3.5–5.2)
Alkaline Phosphatase: 59 U/L (ref 39–117)
BUN: 12 mg/dL (ref 6–23)
CO2: 25 mEq/L (ref 19–32)
Calcium: 9.6 mg/dL (ref 8.4–10.5)
Chloride: 102 mEq/L (ref 96–112)
Creatinine, Ser: 0.69 mg/dL (ref 0.40–1.20)
GFR: 99.69 mL/min (ref >60.00)
Glucose, Bld: 86 mg/dL (ref 70–99)
Potassium: 3.8 mEq/L (ref 3.5–5.1)
Sodium: 144 mEq/L (ref 135–145)
Total Bilirubin: 0.5 mg/dL (ref 0.2–1.2)
Total Protein: 8.2 g/dL (ref 6.0–8.3)

## 2020-06-23 LAB — LIPID PANEL
Cholesterol: 228 mg/dL — ABNORMAL HIGH (ref 0–200)
HDL: 66.5 mg/dL (ref >39.00)
LDL Cholesterol: 145 mg/dL — ABNORMAL HIGH (ref 0–99)
NonHDL: 161.24
Total CHOL/HDL Ratio: 3
Triglycerides: 82 mg/dL (ref 0.0–149.0)
VLDL: 16.4 mg/dL (ref 0.0–40.0)

## 2020-06-23 LAB — CBC WITH DIFFERENTIAL/PLATELET
Basophils Absolute: 0 10*3/uL (ref 0.0–0.1)
Basophils Relative: 0.8 % (ref 0.0–3.0)
Eosinophils Absolute: 0.3 10*3/uL (ref 0.0–0.7)
Eosinophils Relative: 5.7 % — ABNORMAL HIGH (ref 0.0–5.0)
HCT: 37 % (ref 36.0–46.0)
Hemoglobin: 12.3 g/dL (ref 12.0–15.0)
Lymphocytes Relative: 39.1 % (ref 12.0–46.0)
Lymphs Abs: 1.9 10*3/uL (ref 0.7–4.0)
MCHC: 33.2 g/dL (ref 30.0–36.0)
MCV: 94.1 fl (ref 78.0–100.0)
Monocytes Absolute: 0.5 10*3/uL (ref 0.1–1.0)
Monocytes Relative: 9.6 % (ref 3.0–12.0)
Neutro Abs: 2.2 10*3/uL (ref 1.4–7.7)
Neutrophils Relative %: 44.8 % (ref 43.0–77.0)
Platelets: 240 10*3/uL (ref 150.0–400.0)
RBC: 3.93 Mil/uL (ref 3.87–5.11)
RDW: 13 % (ref 11.5–15.5)
WBC: 4.9 10*3/uL (ref 4.0–10.5)

## 2020-06-23 LAB — HEMOGLOBIN A1C: Hgb A1c MFr Bld: 5.5 % (ref 4.6–6.5)

## 2020-06-23 LAB — TSH: TSH: 2.48 u[IU]/mL (ref 0.35–4.50)

## 2020-10-09 DIAGNOSIS — I10 Essential (primary) hypertension: Secondary | ICD-10-CM | POA: Diagnosis not present

## 2020-10-09 DIAGNOSIS — Z23 Encounter for immunization: Secondary | ICD-10-CM | POA: Diagnosis not present

## 2020-10-09 DIAGNOSIS — R002 Palpitations: Secondary | ICD-10-CM | POA: Diagnosis not present

## 2020-10-09 DIAGNOSIS — R0602 Shortness of breath: Secondary | ICD-10-CM | POA: Diagnosis not present

## 2020-11-23 ENCOUNTER — Ambulatory Visit (INDEPENDENT_AMBULATORY_CARE_PROVIDER_SITE_OTHER): Payer: BC Managed Care – PPO | Admitting: Family Medicine

## 2020-11-23 ENCOUNTER — Encounter: Payer: Self-pay | Admitting: Family Medicine

## 2020-11-23 ENCOUNTER — Other Ambulatory Visit: Payer: Self-pay

## 2020-11-23 VITALS — BP 114/64 | HR 62 | Temp 98.6°F | Resp 16 | Wt 124.6 lb

## 2020-11-23 DIAGNOSIS — M109 Gout, unspecified: Secondary | ICD-10-CM | POA: Diagnosis not present

## 2020-11-23 DIAGNOSIS — E559 Vitamin D deficiency, unspecified: Secondary | ICD-10-CM

## 2020-11-23 DIAGNOSIS — F419 Anxiety disorder, unspecified: Secondary | ICD-10-CM

## 2020-11-23 DIAGNOSIS — I10 Essential (primary) hypertension: Secondary | ICD-10-CM | POA: Diagnosis not present

## 2020-11-23 DIAGNOSIS — E782 Mixed hyperlipidemia: Secondary | ICD-10-CM

## 2020-11-23 DIAGNOSIS — Z1239 Encounter for other screening for malignant neoplasm of breast: Secondary | ICD-10-CM

## 2020-11-23 DIAGNOSIS — R739 Hyperglycemia, unspecified: Secondary | ICD-10-CM | POA: Diagnosis not present

## 2020-11-23 MED ORDER — SUMATRIPTAN SUCCINATE 100 MG PO TABS: 100.0000 mg | ORAL_TABLET | ORAL | 3 refills | Status: AC | PRN

## 2020-11-23 MED ORDER — ESCITALOPRAM OXALATE 5 MG PO TABS: 5.0000 mg | ORAL_TABLET | Freq: Every day | ORAL | 1 refills | Status: DC

## 2020-11-23 NOTE — Progress Notes (Signed)
Subjective:   By signing my name below, I, Gail Levine, attest that this documentation has been prepared under the direction and in the presence of Gail Lukes, MD. 11/23/2020   Patient ID: Gail Levine, female    DOB: 28-Apr-1965, 55 y.o.   MRN: [id number]  Chief Complaint  Patient presents with   Follow-up    HPI Patient is in today for an office visit.  She reports having intermittent lower back and pelvic pain with brown spots in her urine.  She was recently scratched by a cat.  She reports that she has good and bad days and is dealing with her moods on those days. She is managing her anxiety with 5 mg lexapro.  She is requesting a refill on 50 mg Imitrex and mentions that she has to use 2 pills when she gets headaches. She gets the headaches every 2- 3 months.   She will get her flu vaccine next week. She is interested in getting a HIV and Hepatitis C screening.  Past Medical History:  Diagnosis Date   Acute bronchitis 09/09/2015   Allergy    Arthritis    Asthma    Asthma 09/09/2015   Back pain 01/08/2016   Breast cancer, left breast ([place]) 2007   Left Breast Cancer   Cancer ([place]) 2007   Cervical cancer screening 01/01/2015   Constipation 11/23/2015   Dermatitis 01/04/2015   Estrogen deficient vulvovaginitis 09/14/2014   Gout    Gout 06/03/2016   HIV infection ([place])    HTN (hypertension), benign 11/23/2015   Hyperlipidemia    Positive PPD    Preventative health care 01/20/2017   Pruritus 09/04/2014   Thyroid disease    Vitamin D deficiency     Past Surgical History:  Procedure Laterality Date   MASTECTOMY Left     Family History  Problem Relation Age of Onset   Cancer Mother        cervical cancer   Hypertension Father    Stroke Father    Heart disease Father    Diabetes Father    Pulmonary fibrosis Father    Cancer Sister        cervical cancer    Social History   Socioeconomic History   Marital status: Single    Spouse name: Not on file   Number of  children: Not on file   Years of education: Not on file   Highest education level: Not on file  Occupational History   Occupation: patient care coordinator  Tobacco Use   Smoking status: Never   Smokeless tobacco: Never  Substance and Sexual Activity   Alcohol use: Yes    Alcohol/week: 0.0 standard drinks   Drug use: No   Sexual activity: Never  Other Topics Concern   Not on file  Social History Narrative   Not on file   Social Determinants of Health   Financial Resource Strain: Not on file  Food Insecurity: Not on file  Transportation Needs: Not on file  Physical Activity: Not on file  Stress: Not on file  Social Connections: Not on file  Intimate Partner Violence: Not on file    Outpatient Medications Prior to Visit  Medication Sig Dispense Refill   allopurinol (ZYLOPRIM) 100 MG tablet Take 1 tablet (100 mg total) by mouth daily as needed. 90 tablet 1   ALPRAZolam (XANAX) 0.25 MG tablet Take 1 tablet (0.25 mg total) by mouth 2 (two) times daily as needed for anxiety. 20 tablet 2  Ascorbic Acid (VITAMIN C) 1000 MG tablet Take 1,000 mg by mouth daily.     aspirin EC 81 MG tablet Take 1 tablet (81 mg total) by mouth daily.     b complex vitamins tablet Take 1 tablet by mouth daily.     calcium carbonate (OS-CAL) 600 MG TABS tablet Take 1,800 mg by mouth 2 (two) times daily with a meal.     diltiazem (CARDIZEM) 30 MG tablet Take 1 tablet (30 mg total) by mouth 3 (three) times daily.     diphenhydrAMINE (BENADRYL) 25 MG tablet Take 25 mg by mouth every 6 (six) hours as needed for itching.      fluticasone (FLONASE) 50 MCG/ACT nasal spray Place 2 sprays into both nostrils daily. 16 g 1   hydrochlorothiazide (HYDRODIURIL) 25 MG tablet Take 0.5 tablets (12.5 mg total) by mouth daily as needed. 1/2 tablet by mouth every day     ketoconazole (NIZORAL) 2 % shampoo Apply 1 application topically 2 (two) times a week. 120 mL 1   levalbuterol (XOPENEX HFA) 45 MCG/ACT inhaler Inhale 1  puff into the lungs every 6 (six) hours as needed for wheezing or shortness of breath. Fill as daw. They do have permission to fill either but go ahead and rewrite. 1 Inhaler 5   Multiple Vitamin (MULTIVITAMIN) tablet Take 1 tablet by mouth daily.     Vitamin D, Ergocalciferol, (DRISDOL) 1.25 MG (50000 UT) CAPS capsule Take 1 capsule (50,000 Units total) by mouth every 7 (seven) days. 4 capsule 5   escitalopram (LEXAPRO) 5 MG tablet Take 1 tablet (5 mg total) by mouth daily. 90 tablet 1   promethazine-dextromethorphan (PROMETHAZINE-DM) 6.25-15 MG/5ML syrup Take 2.5 mLs by mouth 4 (four) times daily as needed for cough. 118 mL 0   SUMAtriptan (IMITREX) 50 MG tablet Take 1 tablet (50 mg total) by mouth every 2 (two) hours as needed for migraine (max 2 doses in 24 hours). 10 tablet 1   metoprolol succinate (TOPROL-XL) 25 MG 24 hr tablet Take by mouth.     benzonatate (TESSALON) 100 MG capsule Take 1 capsule (100 mg total) by mouth 3 (three) times daily as needed for cough. 30 capsule 0   No facility-administered medications prior to visit.    Allergies  Allergen Reactions   Sulfa Antibiotics Anaphylaxis   Penicillins Rash    Review of Systems  Constitutional:  Negative for fever and malaise/fatigue.  HENT:  Negative for congestion.   Eyes:  Negative for redness.  Respiratory:  Negative for shortness of breath.   Cardiovascular:  Negative for chest pain, palpitations and leg swelling.  Gastrointestinal:  Negative for abdominal pain, blood in stool and nausea.  Genitourinary:  Negative for dysuria and frequency.       (+) brown spots in urine  Musculoskeletal:  Positive for back pain (lower). Negative for falls.  Skin:  Negative for rash.  Neurological:  Negative for dizziness, loss of consciousness and headaches.  Endo/Heme/Allergies:  Negative for polydipsia.  Psychiatric/Behavioral:  Negative for depression. The patient is not nervous/anxious.       Objective:    Physical  Exam Constitutional:      General: She is not in acute distress.    Appearance: She is well-developed.  HENT:     Head: Normocephalic and atraumatic.  Eyes:     Conjunctiva/sclera: Conjunctivae normal.  Neck:     Thyroid: No thyromegaly.  Cardiovascular:     Rate and Rhythm: Normal rate and regular rhythm.  Heart sounds: Normal heart sounds. No murmur heard. Pulmonary:     Effort: Pulmonary effort is normal. No respiratory distress.     Breath sounds: Normal breath sounds.  Abdominal:     General: Bowel sounds are normal. There is no distension.     Palpations: Abdomen is soft. There is no mass.     Tenderness: There is no abdominal tenderness.  Musculoskeletal:     Cervical back: Neck supple.  Lymphadenopathy:     Cervical: No cervical adenopathy.  Skin:    General: Skin is warm and dry.  Neurological:     Mental Status: She is alert and oriented to person, place, and time.  Psychiatric:        Behavior: Behavior normal.    BP 114/64   Pulse 62   Temp 98.6 F (37 C)   Resp 16   Wt 124 lb 9.6 oz (56.5 kg)   SpO2 97%   BMI 24.33 kg/m  Wt Readings from Last 3 Encounters:  11/23/20 124 lb 9.6 oz (56.5 kg)  06/22/20 121 lb 9.6 oz (55.2 kg)  03/31/20 127 lb 0.6 oz (57.6 kg)    Diabetic Foot Exam - Simple   No data filed    Lab Results  Component Value Date   WBC 4.9 06/22/2020   HGB 12.3 06/22/2020   HCT 37.0 06/22/2020   PLT 240.0 06/22/2020   GLUCOSE 86 06/22/2020   CHOL 228 (H) 06/22/2020   TRIG 82.0 06/22/2020   HDL 66.50 06/22/2020   LDLCALC 145 (H) 06/22/2020   ALT 11 06/22/2020   AST 22 06/22/2020   NA 144 06/22/2020   K 3.8 06/22/2020   CL 102 06/22/2020   CREATININE 0.69 06/22/2020   BUN 12 06/22/2020   CO2 25 06/22/2020   TSH 2.48 06/22/2020   HGBA1C 5.5 06/22/2020    Lab Results  Component Value Date   TSH 2.48 06/22/2020   Lab Results  Component Value Date   WBC 4.9 06/22/2020   HGB 12.3 06/22/2020   HCT 37.0 06/22/2020    MCV 94.1 06/22/2020   PLT 240.0 06/22/2020   Lab Results  Component Value Date   NA 144 06/22/2020   K 3.8 06/22/2020   CHLORIDE 104 07/01/2016   CO2 25 06/22/2020   GLUCOSE 86 06/22/2020   BUN 12 06/22/2020   CREATININE 0.69 06/22/2020   BILITOT 0.5 06/22/2020   ALKPHOS 59 06/22/2020   AST 22 06/22/2020   ALT 11 06/22/2020   PROT 8.2 06/22/2020   ALBUMIN 4.4 06/22/2020   CALCIUM 9.6 06/22/2020   ANIONGAP 7 07/01/2016   EGFR 90 (L) 07/01/2016   GFR 99.69 06/22/2020   Lab Results  Component Value Date   CHOL 228 (H) 06/22/2020   Lab Results  Component Value Date   HDL 66.50 06/22/2020   Lab Results  Component Value Date   LDLCALC 145 (H) 06/22/2020   Lab Results  Component Value Date   TRIG 82.0 06/22/2020   Lab Results  Component Value Date   CHOLHDL 3 06/22/2020   Lab Results  Component Value Date   HGBA1C 5.5 06/22/2020       Assessment & Plan:   Problem List Items Addressed This Visit     Vitamin D deficiency - Primary    Supplement and monitor      Relevant Orders   VITAMIN D 25 Hydroxy (Vit-D Deficiency, Fractures)   Hyperlipidemia    Encourage heart healthy diet such as MIND or  DASH diet, increase exercise, avoid trans fats, simple carbohydrates and processed foods, consider a krill or fish or flaxseed oil cap daily.       Relevant Orders   Lipid panel   HTN (hypertension), benign    Well controlled, no changes to meds. Encouraged heart healthy diet such as the DASH diet and exercise as tolerated.       Relevant Orders   CBC   Comprehensive metabolic panel   TSH   Gout    Asymptomatic on Allopurinol. Continue to hydrate well      Relevant Orders   Uric acid   Encounter for screening for malignant neoplasm of breast    MGM ordered      Relevant Orders   MM 3D SCREEN BREAST BILATERAL   Hyperglycemia    hgba1c acceptable, minimize simple carbs. Increase exercise as tolerated.       Relevant Orders   Hemoglobin A1c   Anxiety     Doing well may continue sparing use of Alprazolam prn      Relevant Medications   escitalopram (LEXAPRO) 5 MG tablet     Meds ordered this encounter  Medications   escitalopram (LEXAPRO) 5 MG tablet    Sig: Take 1 tablet (5 mg total) by mouth daily.    Dispense:  90 tablet    Refill:  1   SUMAtriptan (IMITREX) 100 MG tablet    Sig: Take 1 tablet (100 mg total) by mouth every 2 (two) hours as needed for migraine. May repeat in 2 hours if headache persists or recurs.    Dispense:  10 tablet    Refill:  3    I,Gail Levine,acting as a scribe for Penni Homans, MD.,have documented all relevant documentation on the behalf of Penni Homans, MD,as directed by  Penni Homans, MD while in the presence of Penni Homans, MD.   I, Gail Lukes, MD. , personally preformed the services described in this documentation.  All medical record entries made by the scribe were at my direction and in my presence.  I have reviewed the chart and discharge instructions (if applicable) and agree that the record reflects my personal performance and is accurate and complete. 11/23/2020

## 2020-11-23 NOTE — Assessment & Plan Note (Signed)
hgba1c acceptable, minimize simple carbs. Increase exercise as tolerated.  

## 2020-11-23 NOTE — Assessment & Plan Note (Signed)
Doing well may continue sparing use of Alprazolam prn

## 2020-11-23 NOTE — Assessment & Plan Note (Signed)
MGM ordered 

## 2020-11-23 NOTE — Assessment & Plan Note (Signed)
Asymptomatic on Allopurinol. Continue to hydrate well

## 2020-11-23 NOTE — Assessment & Plan Note (Signed)
Encourage heart healthy diet such as MIND or DASH diet, increase exercise, avoid trans fats, simple carbohydrates and processed foods, consider a krill or fish or flaxseed oil cap daily.  °

## 2020-11-23 NOTE — Assessment & Plan Note (Signed)
Supplement and monitor 

## 2020-11-23 NOTE — Assessment & Plan Note (Signed)
Well controlled, no changes to meds. Encouraged heart healthy diet such as the DASH diet and exercise as tolerated.  °

## 2020-11-24 ENCOUNTER — Other Ambulatory Visit: Payer: Self-pay | Admitting: *Deleted

## 2020-11-24 DIAGNOSIS — E559 Vitamin D deficiency, unspecified: Secondary | ICD-10-CM

## 2020-11-24 DIAGNOSIS — E782 Mixed hyperlipidemia: Secondary | ICD-10-CM

## 2020-11-24 DIAGNOSIS — I10 Essential (primary) hypertension: Secondary | ICD-10-CM

## 2020-11-24 LAB — COMPREHENSIVE METABOLIC PANEL
ALT: 21 U/L (ref 0–35)
AST: 32 U/L (ref 0–37)
Albumin: 3.9 g/dL (ref 3.5–5.2)
Alkaline Phosphatase: 60 U/L (ref 39–117)
BUN: 12 mg/dL (ref 6–23)
CO2: 33 mEq/L — ABNORMAL HIGH (ref 19–32)
Calcium: 8.9 mg/dL (ref 8.4–10.5)
Chloride: 99 mEq/L (ref 96–112)
Creatinine, Ser: 0.64 mg/dL (ref 0.40–1.20)
GFR: 101.21 mL/min (ref >60.00)
Glucose, Bld: 102 mg/dL — ABNORMAL HIGH (ref 70–99)
Potassium: 3.6 mEq/L (ref 3.5–5.1)
Sodium: 139 mEq/L (ref 135–145)
Total Bilirubin: 0.6 mg/dL (ref 0.2–1.2)
Total Protein: 7.4 g/dL (ref 6.0–8.3)

## 2020-11-24 LAB — CBC
HCT: 37.6 % (ref 36.0–46.0)
Hemoglobin: 12.1 g/dL (ref 12.0–15.0)
MCHC: 32.1 g/dL (ref 30.0–36.0)
MCV: 94.6 fl (ref 78.0–100.0)
Platelets: 206 10*3/uL (ref 150.0–400.0)
RBC: 3.97 Mil/uL (ref 3.87–5.11)
RDW: 13.4 % (ref 11.5–15.5)
WBC: 4.3 10*3/uL (ref 4.0–10.5)

## 2020-11-24 LAB — TSH: TSH: 2.78 u[IU]/mL (ref 0.35–5.50)

## 2020-11-24 LAB — LIPID PANEL
Cholesterol: 214 mg/dL — ABNORMAL HIGH (ref 0–200)
HDL: 62.9 mg/dL (ref >39.00)
LDL Cholesterol: 139 mg/dL — ABNORMAL HIGH (ref 0–99)
NonHDL: 150.98
Total CHOL/HDL Ratio: 3
Triglycerides: 60 mg/dL (ref 0.0–149.0)
VLDL: 12 mg/dL (ref 0.0–40.0)

## 2020-11-24 LAB — URIC ACID: Uric Acid, Serum: 7.2 mg/dL — ABNORMAL HIGH (ref 2.4–7.0)

## 2020-11-24 LAB — HEMOGLOBIN A1C: Hgb A1c MFr Bld: 5.4 % (ref 4.6–6.5)

## 2020-11-24 LAB — VITAMIN D 25 HYDROXY (VIT D DEFICIENCY, FRACTURES): VITD: 19.47 ng/mL — ABNORMAL LOW (ref 30.00–100.00)

## 2020-11-30 ENCOUNTER — Other Ambulatory Visit (HOSPITAL_BASED_OUTPATIENT_CLINIC_OR_DEPARTMENT_OTHER): Payer: Self-pay

## 2020-11-30 MED ORDER — INFLUENZA VAC SPLIT QUAD 0.5 ML IM SUSY
PREFILLED_SYRINGE | INTRAMUSCULAR | 0 refills | Status: DC
  Filled 2020-11-30: qty 0.5, 1d supply, fill #0

## 2021-04-10 ENCOUNTER — Encounter: Payer: Self-pay | Admitting: Family Medicine

## 2021-04-12 ENCOUNTER — Telehealth (INDEPENDENT_AMBULATORY_CARE_PROVIDER_SITE_OTHER): Payer: 59 | Admitting: Family

## 2021-04-12 DIAGNOSIS — T7840XA Allergy, unspecified, initial encounter: Secondary | ICD-10-CM

## 2021-04-12 DIAGNOSIS — R21 Rash and other nonspecific skin eruption: Secondary | ICD-10-CM

## 2021-04-12 MED ORDER — TRIAMCINOLONE ACETONIDE 0.1 % EX CREA: 1.0000 "application " | TOPICAL_CREAM | Freq: Two times a day (BID) | CUTANEOUS | 0 refills | Status: DC

## 2021-04-12 NOTE — Assessment & Plan Note (Signed)
New. Recommended trial of triamcinolone cream.  Wash hands after she handles cats.  ?

## 2021-04-12 NOTE — Progress Notes (Signed)
MyChart Video Visit    Virtual Visit via Video Note   This visit type was conducted due to national recommendations for restrictions regarding the COVID-19 Pandemic (e.g. social distancing) in an effort to limit this patient's exposure and mitigate transmission in our community. This patient is at least at moderate risk for complications without adequate follow up. This format is felt to be most appropriate for this patient at this time. Physical exam was limited by quality of the video and audio technology used for the visit. Lorrin Goodell. was able to get the patient set up on a video visit.  Patient location: Work Patient and provider in visit Provider location: Office  I discussed the limitations of evaluation and management by telemedicine and the availability of in person appointments. The patient expressed understanding and agreed to proceed.  Visit Date: 04/12/2021  Today's healthcare provider: Nance Pear, NP    Subjective:    Patient ID: Gail Levine, female    DOB: [birth year], 56 y.o.   MRN: [id number]  Chief Complaint  Patient presents with   Rash    Complains of skin rash on hands    HPI Patient is in today for a video visit.  Rash- She is complaining of a rash on her two hands. Adds they were bleeding over the weekend. She recently adopted two kittens from the adoption center and was cleaning up after them bare-handed.  Past Medical History:  Diagnosis Date   Acute bronchitis 09/09/2015   Allergy    Arthritis    Asthma    Asthma 09/09/2015   Back pain 01/08/2016   Breast cancer, left breast ([place]) 2007   Left Breast Cancer   Cancer ([place]) 2007   Cervical cancer screening 01/01/2015   Constipation 11/23/2015   Dermatitis 01/04/2015   Estrogen deficient vulvovaginitis 09/14/2014   Gout    Gout 06/03/2016   HIV infection ([place])    HTN (hypertension), benign 11/23/2015   Hyperlipidemia    Positive PPD    Preventative health care 01/20/2017   Pruritus 09/04/2014    Thyroid disease    Vitamin D deficiency     Past Surgical History:  Procedure Laterality Date   MASTECTOMY Left     Family History  Problem Relation Age of Onset   Cancer Mother        cervical cancer   Hypertension Father    Stroke Father    Heart disease Father    Diabetes Father    Pulmonary fibrosis Father    Cancer Sister        cervical cancer    Social History   Socioeconomic History   Marital status: Single    Spouse name: Not on file   Number of children: Not on file   Years of education: Not on file   Highest education level: Not on file  Occupational History   Occupation: patient care coordinator  Tobacco Use   Smoking status: Never   Smokeless tobacco: Never  Substance and Sexual Activity   Alcohol use: Yes    Alcohol/week: 0.0 standard drinks   Drug use: No   Sexual activity: Never  Other Topics Concern   Not on file  Social History Narrative   Not on file   Social Determinants of Health   Financial Resource Strain: Not on file  Food Insecurity: Not on file  Transportation Needs: Not on file  Physical Activity: Not on file  Stress: Not on file  Social Connections: Not on  file  Intimate Partner Violence: Not on file    Outpatient Medications Prior to Visit  Medication Sig Dispense Refill   allopurinol (ZYLOPRIM) 100 MG tablet Take 1 tablet (100 mg total) by mouth daily as needed. 90 tablet 1   ALPRAZolam (XANAX) 0.25 MG tablet Take 1 tablet (0.25 mg total) by mouth 2 (two) times daily as needed for anxiety. 20 tablet 2   Ascorbic Acid (VITAMIN C) 1000 MG tablet Take 1,000 mg by mouth daily.     aspirin EC 81 MG tablet Take 1 tablet (81 mg total) by mouth daily.     b complex vitamins tablet Take 1 tablet by mouth daily.     calcium carbonate (OS-CAL) 600 MG TABS tablet Take 1,800 mg by mouth 2 (two) times daily with a meal.     diltiazem (CARDIZEM) 30 MG tablet Take 1 tablet (30 mg total) by mouth 3 (three) times daily.     diphenhydrAMINE  (BENADRYL) 25 MG tablet Take 25 mg by mouth every 6 (six) hours as needed for itching.      escitalopram (LEXAPRO) 5 MG tablet Take 1 tablet (5 mg total) by mouth daily. 90 tablet 1   fluticasone (FLONASE) 50 MCG/ACT nasal spray Place 2 sprays into both nostrils daily. 16 g 1   hydrochlorothiazide (HYDRODIURIL) 25 MG tablet Take 0.5 tablets (12.5 mg total) by mouth daily as needed. 1/2 tablet by mouth every day     influenza vac split quadrivalent PF (FLUARIX) 0.5 ML injection Inject into the muscle. 0.5 mL 0   ketoconazole (NIZORAL) 2 % shampoo Apply 1 application topically 2 (two) times a week. 120 mL 1   levalbuterol (XOPENEX HFA) 45 MCG/ACT inhaler Inhale 1 puff into the lungs every 6 (six) hours as needed for wheezing or shortness of breath. Fill as daw. They do have permission to fill either but go ahead and rewrite. 1 Inhaler 5   metoprolol succinate (TOPROL-XL) 25 MG 24 hr tablet Take by mouth.     Multiple Vitamin (MULTIVITAMIN) tablet Take 1 tablet by mouth daily.     SUMAtriptan (IMITREX) 100 MG tablet Take 1 tablet (100 mg total) by mouth every 2 (two) hours as needed for migraine. May repeat in 2 hours if headache persists or recurs. 10 tablet 3   Vitamin D, Ergocalciferol, (DRISDOL) 1.25 MG (50000 UT) CAPS capsule Take 1 capsule (50,000 Units total) by mouth every 7 (seven) days. 4 capsule 5   No facility-administered medications prior to visit.    Allergies  Allergen Reactions   Sulfa Antibiotics Anaphylaxis   Penicillins Rash    Review of Systems  Constitutional:  Negative for fever.  HENT:  Negative for ear pain.   Eyes:  Negative for blurred vision.  Respiratory:  Negative for cough.   Cardiovascular:  Negative for chest pain.  Gastrointestinal:  Negative for nausea.  Genitourinary:  Negative for dysuria.  Musculoskeletal:  Negative for myalgias.  Skin:  Positive for rash (hands).  Neurological:  Negative for dizziness.  Psychiatric/Behavioral:  Negative for  depression.       Objective:    Physical Exam Constitutional:      Appearance: Normal appearance. She is not ill-appearing.  Neurological:     Mental Status: She is alert and oriented to person, place, and time.  Psychiatric:        Behavior: Behavior normal.        Judgment: Judgment normal.  Skin: eczematous rash noted bilateral dorsal hands  There were  no vitals taken for this visit. Wt Readings from Last 3 Encounters:  11/23/20 124 lb 9.6 oz (56.5 kg)  06/22/20 121 lb 9.6 oz (55.2 kg)  03/31/20 127 lb 0.6 oz (57.6 kg)    Diabetic Foot Exam - Simple   No data filed    Lab Results  Component Value Date   WBC 4.3 11/23/2020   HGB 12.1 11/23/2020   HCT 37.6 11/23/2020   PLT 206.0 11/23/2020   GLUCOSE 102 (H) 11/23/2020   CHOL 214 (H) 11/23/2020   TRIG 60.0 11/23/2020   HDL 62.90 11/23/2020   LDLCALC 139 (H) 11/23/2020   ALT 21 11/23/2020   AST 32 11/23/2020   NA 139 11/23/2020   K 3.6 11/23/2020   CL 99 11/23/2020   CREATININE 0.64 11/23/2020   BUN 12 11/23/2020   CO2 33 (H) 11/23/2020   TSH 2.78 11/23/2020   HGBA1C 5.4 11/23/2020    Lab Results  Component Value Date   TSH 2.78 11/23/2020   Lab Results  Component Value Date   WBC 4.3 11/23/2020   HGB 12.1 11/23/2020   HCT 37.6 11/23/2020   MCV 94.6 11/23/2020   PLT 206.0 11/23/2020   Lab Results  Component Value Date   NA 139 11/23/2020   K 3.6 11/23/2020   CHLORIDE 104 07/01/2016   CO2 33 (H) 11/23/2020   GLUCOSE 102 (H) 11/23/2020   BUN 12 11/23/2020   CREATININE 0.64 11/23/2020   BILITOT 0.6 11/23/2020   ALKPHOS 60 11/23/2020   AST 32 11/23/2020   ALT 21 11/23/2020   PROT 7.4 11/23/2020   ALBUMIN 3.9 11/23/2020   CALCIUM 8.9 11/23/2020   ANIONGAP 7 07/01/2016   EGFR 90 (L) 07/01/2016   GFR 101.21 11/23/2020   Lab Results  Component Value Date   CHOL 214 (H) 11/23/2020   Lab Results  Component Value Date   HDL 62.90 11/23/2020   Lab Results  Component Value Date   LDLCALC  139 (H) 11/23/2020   Lab Results  Component Value Date   TRIG 60.0 11/23/2020   Lab Results  Component Value Date   CHOLHDL 3 11/23/2020   Lab Results  Component Value Date   HGBA1C 5.4 11/23/2020       Assessment & Plan:   Problem List Items Addressed This Visit       Unprioritized   Skin rash    New. Recommended trial of triamcinolone cream.  Wash hands after she handles cats.       Allergies - Primary    ? Cat allergy. She would like a referral to an Allergist for formal testing.       Relevant Medications   triamcinolone cream (KENALOG) 0.1 %   Other Relevant Orders   Ambulatory referral to Allergy    Meds ordered this encounter  Medications   triamcinolone cream (KENALOG) 0.1 %    Sig: Apply 1 application. topically 2 (two) times daily. Apply twice daily to affected area.    Dispense:  30 g    Refill:  0    Order Specific Question:   Supervising Provider    Answer:   Penni Homans A [[id number]]    I discussed the assessment and treatment plan with the patient. The patient was provided an opportunity to ask questions and all were answered. The patient agreed with the plan and demonstrated an understanding of the instructions.   The patient was advised to call back or seek an in-person evaluation if the symptoms  worsen or if the condition fails to improve as anticipated.  I,[name],acting as a Education administrator for [employer], NP.,have documented all relevant documentation on the behalf of Nance Pear, NP,as directed by  Nance Pear, NP while in the presence of Nance Pear, NP.    Nance Pear, NP [employer] [facility] [employer] [facility] [phone] (phone) [phone] (fax)  [place]

## 2021-04-12 NOTE — Assessment & Plan Note (Signed)
?   Cat allergy. She would like a referral to an Allergist for formal testing.  ?

## 2021-05-06 ENCOUNTER — Encounter: Payer: Self-pay | Admitting: Allergy & Immunology

## 2021-05-06 ENCOUNTER — Ambulatory Visit: Payer: 59 | Admitting: Allergy & Immunology

## 2021-05-06 VITALS — BP 128/78 | HR 70 | Temp 97.7°F | Resp 16 | Ht 59.45 in | Wt 127.0 lb

## 2021-05-06 DIAGNOSIS — J4530 Mild persistent asthma, uncomplicated: Secondary | ICD-10-CM | POA: Diagnosis not present

## 2021-05-06 DIAGNOSIS — L2089 Other atopic dermatitis: Secondary | ICD-10-CM | POA: Diagnosis not present

## 2021-05-06 NOTE — Patient Instructions (Addendum)
1. Flexural atopic dermatitis ?- Nothing was positive on testing today. ?- We were going to get some labs to confirm this. ?- Copy of testing results provided. ?- In the meantime, start Allegra (fexofeandine) 180 mg 1-2 times daily. ?- Start triamcinolone 0.1% ointment twice daily as needed to the worst areas to [place] if this helps. ? ?2. Mild persistent asthma, uncomplicated ?- Lung testing looked good. ?- However, you are using your Xopenex too often. ?- Because of this, we are going to start a controller medication. ?- Spacer use reviewed. ?- Daily controller medication(s): Flovent 150mg 2 puffs twice daily with spacer ?- Prior to physical activity: Xopenex 2 puffs 10-15 minutes before physical activity. ?- Rescue medications: Xopenex 4 puffs every 4-6 hours as needed ?- Changes during respiratory infections or worsening symptoms: Increase Flovent 1175m to 4 puffs twice daily for TWO WEEKS. ?- Asthma control goals:  ?* Full participation in all desired activities (may need albuterol before activity) ?* Albuterol use two time or less a week on average (not counting use with activity) ?* Cough interfering with sleep two time or less a month ?* Oral steroids no more than once a year ?* No hospitalizations ? ?3. Return in about 3 months (around 08/05/2021).  ? ? ?Please inform usKoreaf any Emergency Department visits, hospitalizations, or changes in symptoms. Call usKoreaefore going to the ED for breathing or allergy symptoms since we might be able to fit you in for a sick visit. Feel free to contact usKoreanytime with any questions, problems, or concerns. ? ?It was a pleasure to meet you today! ? ?Websites that have reliable patient information: ?1. American Academy of Asthma, Allergy, and Immunology: www.aaaai.org ?2. Food Allergy Research and Education (FARE): foodallergy.org ?3. Mothers of Asthmatics: http://www.asthmacommunitynetwork.org ?4. AmSPX Corporationf Allergy, Asthma, and Immunology: wwMonthlyElectricBill.co.uk ? ?COVID-19  Vaccine Information can be found at: [email or web address] questions related to vaccine distribution or appointments, please email [email or web address] or call [phone] ? ?We realize that you might be concerned about having an allergic reaction to the COVID19 vaccines. To help with that concern, WE ARE OFFERING THE COVID19 VACCINES IN OUR OFFICE! Ask the front desk for dates!  ? ? ? ??Like? usKorean Facebook and Instagram for our latest updates!  ?  ? ? ?A healthy democracy works best when ALNew York Life Insurancearticipate! Make sure you are registered to vote! If you have moved or changed any of your contact information, you will need to get this updated before voting! ? ?In some cases, you MAY be able to register to vote online: [email or web address] ? ? ? Airborne Adult Perc - 05/06/21 1442   ? ? Time Antigen Placed 1430   ? Allergen Manufacturer GrLavella Hammock ? Location Back   ? Number of Test 59   ? 1. Control-Buffer 50% Glycerol Negative   ? 2. Control-Histamine 1 mg/ml 2+   ? 3. Albumin saline Negative   ? 4. BaLiberalegative   ? 5. BeGuatemalaegative   ? 6. Johnson Negative   ? 7. KeWarfieldlue Negative   ? 8. Meadow Fescue Negative   ? 9. Perennial Rye Negative   ? 10. Sweet Vernal Negative   ? 11. Timothy Negative   ? 12. Cocklebur Negative   ? 13. Burweed Marshelder Negative   ? 14. Ragweed, short Negative   ? 15. Ragweed, Giant Negative   ? 16. Plantain,  English Negative   ? 17. Lamb's Quarters Negative   ?  18. Sheep Sorrell Negative   ? 19. Rough Pigweed Negative   ? 20. Marsh Elder, Rough Negative   ? 21. Mugwort, Common Negative   ? 22. Ash mix Negative   ? 23. Wendee Copp mix Negative   ? 24. Beech American Negative   ? 25. Box, Elder Negative   ? 26. Cedar, red Negative   ? 27. Cottonwood, [place] Negative   ? 28. Elm mix Negative   ? 29. Hickory Negative   ? 30. Maple mix Negative   ? 31. Oak, [place] mix Negative   ? 32. Pecan  Pollen Negative   ? 33. Pine mix Negative   ? 34. Sycamore Eastern Negative   ? 35. Walnut, Black Pollen Negative   ? 36. Alternaria alternata Negative   ? 32. Cladosporium Herbarum Negative   ? 38. Aspergillus mix Negative   ? 39. Penicillium mix Negative   ? 40. Bipolaris sorokiniana (Helminthosporium) Negative   ? 41. Drechslera spicifera (Curvularia) Negative   ? 42. Mucor plumbeus Negative   ? 43. Fusarium moniliforme Negative   ? 44. Aureobasidium pullulans (pullulara) Negative   ? 45. Rhizopus oryzae Negative   ? 46. Botrytis cinera Negative   ? 47. Epicoccum nigrum Negative   ? 48. Phoma betae Negative   ? 49. Candida Albicans Negative   ? 50. Trichophyton mentagrophytes Negative   ? 51. Mite, D Farinae  5,000 AU/ml Negative   ? 52. Mite, D Pteronyssinus  5,000 AU/ml Negative   ? 53. Cat Hair 10,000 BAU/ml Negative   ? 54.  Dog Epithelia Negative   ? 55. Mixed Feathers Negative   ? 56. Horse Epithelia Negative   ? 57. Cockroach, [place] Negative   ? 58. Mouse Negative   ? 59. Tobacco Leaf Negative   ? ?  ?  ? ?  ? ? ? ? ? ? ?

## 2021-05-06 NOTE — Progress Notes (Signed)
? ?NEW PATIENT ? ?Date of Service/Encounter:  05/06/21 ? ?Consult requested by: Mosie Lukes, MD ? ? ?Assessment:  ? ?Flexural atopic dermatitis  ? ?Mild persistent asthma, uncomplicated ? ?Plan/Recommendations:  ? ? ?1. Flexural atopic dermatitis ?- Nothing was positive on testing today. ?- We were going to get some labs to confirm this. ?- Copy of testing results provided. ?- In the meantime, start Allegra (fexofenadine) 180 mg 1-2 times daily. ?- Start triamcinolone 0.1% ointment twice daily as needed to the worst areas to Dark if this helps. ? ?2. Mild persistent asthma, uncomplicated ?- Lung testing looked good. ?- However, you are using your Xopenex too often. ?- Because of this, we are going to start a controller medication. ?- Spacer use reviewed. ?- Daily controller medication(s): Flovent 160mg 2 puffs twice daily with spacer ?- Prior to physical activity: Xopenex 2 puffs 10-15 minutes before physical activity. ?- Rescue medications: Xopenex 4 puffs every 4-6 hours as needed ?- Changes during respiratory infections or worsening symptoms: Increase Flovent 1158m to 4 puffs twice daily for TWO WEEKS. ?- Asthma control goals:  ?* Full participation in all desired activities (may need albuterol before activity) ?* Albuterol use two time or less a week on average (not counting use with activity) ?* Cough interfering with sleep two time or less a month ?* Oral steroids no more than once a year ?* No hospitalizations ? ?3. Return in about 3 months (around 08/05/2021).  ? ? ?This note in its entirety was forwarded to the Provider who requested this consultation. ? ?Subjective:  ? ?Gail Levine is a [age].o. female presenting today for evaluation of  ?Chief Complaint  ?Patient presents with  ? Other  ?  March 4th brought back a kitten and her hands and forearms broke out in hives and they are itchy and painful.  ? Allergy Testing  ? ? ?Gail Levine has a history of the following: ?Patient Active Problem List  ? Diagnosis  Date Noted  ? Skin rash 04/12/2021  ? Allergies 04/12/2021  ? COVID-19 06/22/2020  ? History of COVID-19 03/16/2020  ? Anxiety 09/07/2018  ? Hyperglycemia 05/05/2017  ? Encounter for screening for malignant neoplasm of breast 01/20/2017  ? Gout 06/03/2016  ? Back pain 01/08/2016  ? Constipation 11/23/2015  ? HTN (hypertension), benign 11/23/2015  ? Chest pain 09/10/2015  ? Muscle spasm 09/09/2015  ? Palpitations 09/09/2015  ? Asthma 09/09/2015  ? Dermatitis 01/04/2015  ? Cervical cancer screening 01/01/2015  ? Estrogen deficient vulvovaginitis 09/14/2014  ? Malignant ne[place] of nipple of left breast in [place], estrogen receptor negative [place]  ? Positive PPD   ? Hyperlipidemia   ? Pruritus 09/04/2014  ? Vitamin D deficiency   ? Thyroid disease   ? ? ?History obtained from: chart review and patient. ? ?Gail Levine was referred by BlMosie LukesMD.    ? ?Gail Levine a 89.o. female presenting for an evaluation of a rash . She has been off of antihistamines.  ? ?She got a cat on March 4th and then a few days later she started having itching over her entire body. She actually grew [place] a [place] Finally [place] was able to get a cat [place] then she reacts to it. She has not had issues with dogs. Her sister has a dog that does not seem to bother her at all.  ? ?Pollen does not seem  to bother her at all. All of her 3 older sisters are always sneezing and having a lot of mucosus production. But she has never been like that at all.  ?  ?Asthma/Respiratory Symptom History: She does report that she has been using her inhaler on several times a day basis. She is on Xopenex and she estimates that she gets a new inhaler monthly.  Ask her how long this has been going on and she said since 2004.  She has never been on a different inhaler for her symptom control including an ICS or a combined ICS/LABA.  She does not remember being on prednisone.  She does cough a lot at night.  Xopenex does  tend to help.  She has not been hospitalized.   ? ?Otherwise, there is no history of other atopic diseases, including food allergies, drug allergies, stinging insect allergies, eczema, urticaria, or contact dermatitis. There is no significant infectious history. Vaccinations are up to date.  ? ? ?Past Medical History: ?Patient Active Problem List  ? Diagnosis Date Noted  ? Skin rash 04/12/2021  ? Allergies 04/12/2021  ? COVID-19 06/22/2020  ? History of COVID-19 03/16/2020  ? Anxiety 09/07/2018  ? Hyperglycemia 05/05/2017  ? Encounter for screening for malignant neoplasm of breast 01/20/2017  ? Gout 06/03/2016  ? Back pain 01/08/2016  ? Constipation 11/23/2015  ? HTN (hypertension), benign 11/23/2015  ? Chest pain 09/10/2015  ? Muscle spasm 09/09/2015  ? Palpitations 09/09/2015  ? Asthma 09/09/2015  ? Dermatitis 01/04/2015  ? Cervical cancer screening 01/01/2015  ? Estrogen deficient vulvovaginitis 09/14/2014  ? Malignant neoplasm of nipple of left breast in female, estrogen receptor negative ([place])   ? Positive PPD   ? Hyperlipidemia   ? Pruritus 09/04/2014  ? Vitamin D deficiency   ? Thyroid disease   ? ? ?Medication List:  ?Allergies as of 05/06/2021   ? ?   Reactions  ? Sulfa Antibiotics Anaphylaxis  ? Penicillins Rash  ? ?  ? ?  ?Medication List  ?  ? ?  ? Accurate as of May 06, 2021 11:59 PM. If you have any questions, ask your nurse or doctor.  ?  ?  ? ?  ? ?allopurinol 100 MG tablet ?Commonly known as: ZYLOPRIM ?Take 1 tablet (100 mg total) by mouth daily as needed. ?  ?ALPRAZolam 0.25 MG tablet ?Commonly known as: Duanne Moron ?Take 1 tablet (0.25 mg total) by mouth 2 (two) times daily as needed for anxiety. ?  ?aspirin EC 81 MG tablet ?Take 1 tablet (81 mg total) by mouth daily. ?  ?b complex vitamins tablet ?Take 1 tablet by mouth daily. ?  ?calcium carbonate 600 MG Tabs tablet ?Commonly known as: OS-CAL ?Take 1,800 mg by mouth 2 (two) times daily with a meal. ?  ?diltiazem 30 MG tablet ?Commonly known as:  CARDIZEM ?Take 1 tablet (30 mg total) by mouth 3 (three) times daily. ?  ?diphenhydrAMINE 25 MG tablet ?Commonly known as: BENADRYL ?Take 25 mg by mouth every 6 (six) hours as needed for itching. ?  ?escitalopram 5 MG tablet ?Commonly known as: LEXAPRO ?Take 1 tablet (5 mg total) by mouth daily. ?  ?Fluarix Quadrivalent 0.5 ML injection ?Generic drug: influenza vac split quadrivalent PF ?Inject into the muscle. ?  ?fluticasone 50 MCG/ACT nasal spray ?Commonly known as: FLONASE ?Place 2 sprays into both nostrils daily. ?  ?hydrochlorothiazide 25 MG tablet ?Commonly known as: HYDRODIURIL ?Take 0.5 tablets (12.5 mg total) by mouth daily as needed.  1/2 tablet by mouth every day ?  ?ketoconazole 2 % shampoo ?Commonly known as: NIZORAL ?Apply 1 application topically 2 (two) times a week. ?  ?levalbuterol 45 MCG/ACT inhaler ?Commonly known as: Xopenex HFA ?Inhale 1 puff into the lungs every 6 (six) hours as needed for wheezing or shortness of breath. Fill as daw. They do have permission to fill either but go ahead and rewrite. ?  ?loratadine 10 MG tablet ?Commonly known as: CLARITIN ?Take 10 mg by mouth daily as needed for allergies. ?  ?metoprolol succinate 25 MG 24 hr tablet ?Commonly known as: TOPROL-XL ?Take 1 tablet by mouth daily. ?What changed: Another medication with the same name was removed. Continue taking this medication, and follow the directions you [place] here. ?Changed by: Valentina Shaggy, MD ?  ?multivitamin tablet ?Take 1 tablet by mouth daily. ?  ?SUMAtriptan 100 MG tablet ?Commonly known as: IMITREX ?Take 1 tablet (100 mg total) by mouth every 2 (two) hours as needed for migraine. May repeat in 2 hours if headache persists or recurs. ?  ?triamcinolone cream 0.1 % ?Commonly known as: KENALOG ?Apply 1 application. topically 2 (two) times daily. Apply twice daily to affected area. ?  ?vitamin C 1000 MG tablet ?Take 1,000 mg by mouth daily. ?  ?Vitamin D (Ergocalciferol) 1.25 MG (50000 UNIT) Caps  capsule ?Commonly known as: DRISDOL ?Take 1 capsule (50,000 Units total) by mouth every 7 (seven) days. ?  ? ?  ? ? ?Birth History: non-contributory ? ?Developmental History: non-contributory ? ?Past Surgical History:

## 2021-05-08 LAB — ALLERGENS W/COMP RFLX AREA 2
Alternaria Alternata IgE: <0.1 kU/L
Aspergillus Fumigatus IgE: <0.1 kU/L
Bermuda Grass IgE: <0.1 kU/L
Cedar, Mountain IgE: <0.1 kU/L
Cladosporium Herbarum IgE: <0.1 kU/L
Cockroach, German IgE: <0.1 kU/L
Common Silver Birch IgE: <0.1 kU/L
Cottonwood IgE: <0.1 kU/L
D Farinae IgE: <0.1 kU/L
D Pteronyssinus IgE: <0.1 kU/L
E001-IgE Cat Dander: <0.1 kU/L
E005-IgE Dog Dander: <0.1 kU/L
Elm, American IgE: <0.1 kU/L
IgE (Immunoglobulin E), Serum: 6 IU/mL (ref 6–495)
Johnson Grass IgE: <0.1 kU/L
Maple/Box Elder IgE: <0.1 kU/L
Mouse Urine IgE: <0.1 kU/L
Oak, White IgE: <0.1 kU/L
Pecan, Hickory IgE: <0.1 kU/L
Penicillium Chrysogen IgE: <0.1 kU/L
Pigweed, Rough IgE: <0.1 kU/L
Ragweed, Short IgE: <0.1 kU/L
Sheep Sorrel IgE Qn: <0.1 kU/L
Timothy Grass IgE: <0.1 kU/L
White Mulberry IgE: <0.1 kU/L

## 2021-05-10 ENCOUNTER — Encounter: Payer: Self-pay | Admitting: Allergy & Immunology

## 2021-05-24 ENCOUNTER — Encounter: Payer: BC Managed Care – PPO | Admitting: Family Medicine

## 2021-07-29 ENCOUNTER — Other Ambulatory Visit: Payer: Self-pay | Admitting: Family Medicine

## 2021-08-24 ENCOUNTER — Encounter: Payer: Self-pay | Admitting: Allergy & Immunology

## 2021-08-24 ENCOUNTER — Ambulatory Visit: Payer: 59 | Admitting: Allergy & Immunology

## 2021-08-24 VITALS — BP 124/80 | HR 67 | Temp 98.7°F | Resp 18

## 2021-08-24 DIAGNOSIS — J4530 Mild persistent asthma, uncomplicated: Secondary | ICD-10-CM

## 2021-08-24 DIAGNOSIS — J310 Chronic rhinitis: Secondary | ICD-10-CM | POA: Diagnosis not present

## 2021-08-24 DIAGNOSIS — L2089 Other atopic dermatitis: Secondary | ICD-10-CM

## 2021-08-24 MED ORDER — MONTELUKAST SODIUM 10 MG PO TABS: 10.0000 mg | ORAL_TABLET | Freq: Every day | ORAL | 5 refills | Status: DC

## 2021-08-24 NOTE — Patient Instructions (Addendum)
1. Flexural atopic dermatitis with chronic  - We are going to plan for intradermal testing (more sensitive than the prick testing we already did). - Continue with the Allegra and Claritin as you are doing. - Add on Singulair (montelukast) '10mg'$  daily. - This can rarely cause irritability and bad dreams, so beware of that.   2. Mild persistent asthma, uncomplicated - Lung testing looked good today. - Start the Flovent two puffs ONCE daily to Koran if this can help decrease your Xopenex use. - Daily controller medication(s): Flovent 190mg 2 puffs once daily with spacer - Prior to physical activity: Xopenex 2 puffs 10-15 minutes before physical activity. - Rescue medications: Xopenex 4 puffs every 4-6 hours as needed - Changes during respiratory infections or worsening symptoms: Increase Flovent 1169m to 4 puffs twice daily for TWO WEEKS. - Asthma control goals:  * Full participation in all desired activities (may need albuterol before activity) * Albuterol use two time or less a week on average (not counting use with activity) * Cough interfering with sleep two time or less a month * Oral steroids no more than once a year * No hospitalizations  3. Return in about 4 weeks (around 09/21/2021) for INTRADERMAL TESTING.    Please inform usKoreaf any Emergency Department visits, hospitalizations, or changes in symptoms. Call usKoreaefore going to the ED for breathing or allergy symptoms since we might be able to fit you in for a sick visit. Feel free to contact usKoreanytime with any questions, problems, or concerns.  It was a pleasure to meet you today!  Websites that have reliable patient information: 1. American Academy of Asthma, Allergy, and Immunology: www.aaaai.org 2. Food Allergy Research and Education (FARE): foodallergy.org 3. Mothers of Asthmatics: http://www.asthmacommunitynetwork.org 4. American College of Allergy, Asthma, and Immunology: www.acaai.org   COVID-19 Vaccine Information can  be found at: [email or web address] questions related to vaccine distribution or appointments, please email [email or web address] or call [phone]  We realize that you might be concerned about having an allergic reaction to the COVID19 vaccines. To help with that concern, WE ARE OFFERING THE COVID19 VACCINES IN OUR OFFICE! Ask the front desk for dates!     "Like" usKorean Facebook and Instagram for our latest updates!      A healthy democracy works best when ALNew York Life Insurancearticipate! Make sure you are registered to vote! If you have moved or changed any of your contact information, you will need to get this updated before voting!  In some cases, you MAY be able to register to vote online: [email or web address]

## 2021-08-24 NOTE — Progress Notes (Signed)
FOLLOW UP  Date of Service/Encounter:  08/24/21   Assessment:   Flexural atopic dermatitis    Mild persistent asthma, uncomplicated  Chronic rhinitis - with negative prixk testing and environmental sIgE testing  SVT - on metoprolol   Anxiety - on Lexapro  Plan/Recommendations:   1. Flexural atopic dermatitis with chronic  - We are going to plan for intradermal testing (more sensitive than the prick testing we already did). - Continue with the Allegra and Claritin as you are doing. - Add on Singulair (montelukast) '10mg'$  daily. - This can rarely cause irritability and bad dreams, so beware of that.   2. Mild persistent asthma, uncomplicated - Lung testing looked good today. - Start the Flovent two puffs ONCE daily to [place] if this can help decrease your Xopenex use. - Daily controller medication(s): Flovent 186mg 2 puffs once daily with spacer - Prior to physical activity: Xopenex 2 puffs 10-15 minutes before physical activity. - Rescue medications: Xopenex 4 puffs every 4-6 hours as needed - Changes during respiratory infections or worsening symptoms: Increase Flovent 1164m to 4 puffs twice daily for TWO WEEKS. - Asthma control goals:  * Full participation in all desired activities (may need albuterol before activity) * Albuterol use two time or less a week on average (not counting use with activity) * Cough interfering with sleep two time or less a month * Oral steroids no more than once a year * No hospitalizations  3. Return in about 4 weeks (around 09/21/2021) for INTRADERMAL TESTING.   Subjective:   Gail Levine is a [age].o. female presenting today for follow up of  Chief Complaint  Patient presents with   Follow-up    Gail Levine has a history of the following: Patient Active Problem List   Diagnosis Date Noted   Skin rash 04/12/2021   Allergies 04/12/2021   COVID-19 06/22/2020   History of COVID-19 03/16/2020   Anxiety 09/07/2018   Hyperglycemia 05/05/2017    Encounter for screening for malignant neoplasm of breast 01/20/2017   Gout 06/03/2016   Back pain 01/08/2016   Constipation 11/23/2015   HTN (hypertension), benign 11/23/2015   Chest pain 09/10/2015   Muscle spasm 09/09/2015   Palpitations 09/09/2015   Asthma 09/09/2015   Dermatitis 01/04/2015   Cervical cancer screening 01/01/2015   Estrogen deficient vulvovaginitis 09/14/2014   Malignant neoplasm of nipple of left breast in female, estrogen receptor negative ([facility]   Positive PPD    Hyperlipidemia    Pruritus 09/04/2014   Vitamin D deficiency    Thyroid disease     History obtained from: chart review and patient.  [name] a [age].o. female presenting for a follow up visit. She was last seen in April 20 23 as a new patient.  At that time, she had testing that was negative for everything.  We started Allegra 1-2 times a day and triamcinolone 0.1% ointment twice daily as needed.  For her asthma, her lung testing looked great.  She was using her Xopenex too often.  We started Flovent 110 mcg 2 puffs twice daily and Xopenex as needed. We did environmental allergy testing via the blood and this was negative.   Since the last visit, she thinks that she needs more medicine to treat her symptoms.   Asthma/Respiratory Symptom History: She never did start the Flovent at the last visit. She is using Xopenex frequently.  She has not been on prednisone. She has not been to the ED or Urgent  Care for her symptoms at all.   Allergic Rhinitis Symptom History: She is on Allegra and Claritin. She reports that the cats continue to be a problem for her. She wants to keep them and she wants to keep up with her medications. She is able to tolerate the cats. She sometimes has sniffling.  All of these symptoms started when the cats entered her life.   Skin Symptom History: Skin has been under good control. She is using the triamcinolone as needed. Her skin is under fair control.  She does moisturize daily. She  overall has good control of her skin.  Otherwise, there have been no changes to her past medical history, surgical history, family history, or social history.    Review of Systems  Constitutional: Negative.  Negative for chills, fever, malaise/fatigue and weight loss.  HENT:  Positive for congestion. Negative for ear discharge, ear pain and sinus pain.        Positive for postnasal drip.   Eyes:  Negative for pain, discharge and redness.  Respiratory:  Negative for cough, sputum production, shortness of breath and wheezing.   Cardiovascular: Negative.  Negative for chest pain and palpitations.  Gastrointestinal:  Negative for abdominal pain, heartburn, nausea and vomiting.  Skin: Negative.  Negative for itching and rash.  Neurological:  Negative for dizziness and headaches.  Endo/Heme/Allergies:  Positive for environmental allergies. Does not bruise/bleed easily.       Objective:   Blood pressure 124/80, pulse 67, temperature 98.7 F (37.1 C), resp. rate 18, SpO2 98 %. There is no height or weight on file to calculate BMI.    Physical Exam Vitals reviewed.  Constitutional:      Appearance: She is well-developed.  HENT:     Head: Normocephalic and atraumatic.     Right Ear: Tympanic membrane, ear canal and external ear normal. No drainage, swelling or tenderness. Tympanic membrane is not injected, scarred, erythematous, retracted or bulging.     Left Ear: Tympanic membrane, ear canal and external ear normal. No drainage, swelling or tenderness. Tympanic membrane is not injected, scarred, erythematous, retracted or bulging.     Nose: No nasal deformity, septal deviation, mucosal edema or rhinorrhea.     Right Turbinates: Enlarged, swollen and pale.     Left Turbinates: Enlarged, swollen and pale.     Right Sinus: No maxillary sinus tenderness or frontal sinus tenderness.     Left Sinus: No maxillary sinus tenderness or frontal sinus tenderness.     Comments: No nasal polyps  noted.     Mouth/Throat:     Mouth: Mucous membranes are not pale and not dry.     Pharynx: Uvula midline.  Eyes:     General:        Right eye: No discharge.        Left eye: No discharge.     Conjunctiva/sclera: Conjunctivae normal.     Right eye: Right conjunctiva is not injected. No chemosis.    Left eye: Left conjunctiva is not injected. No chemosis.    Pupils: Pupils are equal, round, and reactive to light.  Cardiovascular:     Rate and Rhythm: Normal rate and regular rhythm.     Heart sounds: Normal heart sounds.  Pulmonary:     Effort: Pulmonary effort is normal. No tachypnea, accessory muscle usage or respiratory distress.     Breath sounds: Normal breath sounds. No wheezing, rhonchi or rales.  Chest:     Chest wall: No tenderness.  Lymphadenopathy:     Head:     Right side of head: No submandibular, tonsillar or occipital adenopathy.     Left side of head: No submandibular, tonsillar or occipital adenopathy.     Cervical: No cervical adenopathy.  Skin:    General: Skin is warm.     Capillary Refill: Capillary refill takes less than 2 seconds.     Coloration: Skin is not pale.     Findings: Rash present. No abrasion, erythema or petechiae. Rash is not papular, urticarial or vesicular.     Comments: She does have some roughened areas on her forearms with excoriations.   Neurological:     Mental Status: She is alert.  Psychiatric:        Behavior: Behavior is cooperative.      Diagnostic studies:   Spirometry: Normal FEV1, FVC, and FEV1/FVC ratio. There is no scooping suggestive of obstructive disease.         Salvatore Marvel, MD  [facility]

## 2021-10-13 ENCOUNTER — Telehealth: Payer: Self-pay | Admitting: Allergy & Immunology

## 2021-10-13 NOTE — Telephone Encounter (Signed)
Patient called and stated that she does not have [employer] insurance any longer and presently has no insurance. Patient would like a call about self pay estimate. Patients number  is [phone]

## 2021-10-26 ENCOUNTER — Ambulatory Visit: Payer: Self-pay | Admitting: Allergy & Immunology

## 2021-10-26 ENCOUNTER — Encounter: Payer: Self-pay | Admitting: Allergy & Immunology

## 2021-10-26 ENCOUNTER — Other Ambulatory Visit: Payer: Self-pay

## 2021-10-26 VITALS — BP 130/80 | HR 64 | Resp 17

## 2021-10-26 DIAGNOSIS — J302 Other seasonal allergic rhinitis: Secondary | ICD-10-CM

## 2021-10-26 DIAGNOSIS — J4530 Mild persistent asthma, uncomplicated: Secondary | ICD-10-CM

## 2021-10-26 DIAGNOSIS — J3089 Other allergic rhinitis: Secondary | ICD-10-CM

## 2021-10-26 DIAGNOSIS — L2089 Other atopic dermatitis: Secondary | ICD-10-CM

## 2021-10-26 NOTE — Patient Instructions (Addendum)
1. Perennial and seasonal allergic rhinitis - Testing was positive to grasses, weeds, indoor and outdoor molds (although they were very minimally reactive). - Copy of testing results provided. - You seem to have good handle on your symptoms. - Continue with Singulair (montelukast) 5 mg daily. - Continue with Flonase (fluticasone) 1 spray per nostril up to twice daily. - Allergy shots are an option, but he seems to have a good handle on her symptoms even without shots.  2. Mild persistent asthma, uncomplicated - Lung testing not done. - We are not going to make any medication changes. - Daily controller medication(s): Flovent 170mg 2 puffs once daily with spacer - Prior to physical activity: Xopenex 2 puffs 10-15 minutes before physical activity. - Rescue medications: Xopenex 4 puffs every 4-6 hours as needed - Changes during respiratory infections or worsening symptoms: Increase Flovent 1172m to 4 puffs twice daily for TWO WEEKS. - Asthma control goals:  * Full participation in all desired activities (may need albuterol before activity) * Albuterol use two time or less a week on average (not counting use with activity) * Cough interfering with sleep two time or less a month * Oral steroids no more than once a year * No hospitalizations  3. Return in about 6 months (around 04/26/2022).    Please inform usKoreaf any Emergency Department visits, hospitalizations, or changes in symptoms. Call usKoreaefore going to the ED for breathing or allergy symptoms since we might be able to fit you in for a sick visit. Feel free to contact usKoreanytime with any questions, problems, or concerns.  It was a pleasure to [place] you today!  Websites that have reliable patient information: 1. American Academy of Asthma, Allergy, and Immunology: www.aaaai.org 2. Food Allergy Research and Education (FARE): foodallergy.org 3. Mothers of Asthmatics: http://www.asthmacommunitynetwork.org 4. American College of Allergy,  Asthma, and Immunology: www.acaai.org   COVID-19 Vaccine Information can be found at: [email or web address] questions related to vaccine distribution or appointments, please email [email or web address] or call [phone]  We realize that you might be concerned about having an allergic reaction to the COVID19 vaccines. To help with that concern, WE ARE OFFERING THE COVID19 VACCINES IN OUR OFFICE! Ask the front desk for dates!     "Like" usKorean Facebook and Instagram for our latest updates!      A healthy democracy works best when ALNew York Life Insurancearticipate! Make sure you are registered to vote! If you have moved or changed any of your contact information, you will need to get this updated before voting!  In some cases, you MAY be able to register to vote online: [email or web address]   Intradermal - 10/26/21 1431     Time Antigen Placed 1410    Allergen Manufacturer Greer    Location Arm    Number of Test 15    Intradermal Select    Control Negative    BeGuatemalaegative    Johnson 1+    7 Grass Negative    Ragweed mix Negative    Weed mix 1+    Tree mix Negative    Mold 1 1+    Mold 2 Negative    Mold 3 Negative    Mold 4 1+    Cat Negative    Dog Negative    Cockroach Negative    Mite mix Negative             Intradermal - 10/26/21 1431     Time Antigen Placed 1410  Allergen Manufacturer Greer    Location Arm    Number of Test 15    Intradermal Select    Control Negative    [place] Negative    Johnson 1+    7 Grass Negative    Ragweed mix Negative    Weed mix 1+    Tree mix Negative    Mold 1 1+    Mold 2 Negative    Mold 3 Negative    Mold 4 1+    Cat Negative    Dog Negative    Cockroach Negative    Mite mix Negative            Reducing Pollen Exposure  The American Academy of Allergy, Asthma and Immunology suggests the following steps to reduce your  exposure to pollen during allergy seasons.    Do not hang sheets or clothing out to dry; pollen may collect on these items. Do not mow lawns or spend time around freshly cut grass; mowing stirs up pollen. Keep windows closed at night.  Keep car windows closed while driving. Minimize morning activities outdoors, a time when pollen counts are usually at their highest. Stay indoors as much as possible when pollen counts or humidity is high and on windy days when pollen tends to remain in the air longer. Use air conditioning when possible.  Many air conditioners have filters that trap the pollen spores. Use a HEPA room air filter to remove pollen form the indoor air you breathe.    Control of Mold Allergen   Mold and fungi can grow on a variety of surfaces provided certain temperature and moisture conditions exist.  Outdoor molds grow on plants, decaying vegetation and soil.  The major outdoor mold, Alternaria and Cladosporium, are found in very high numbers during hot and dry conditions.  Generally, a late Summer - Fall peak is seen for common outdoor fungal spores.  Rain will temporarily lower outdoor mold spore count, but counts rise rapidly when the rainy period ends.  The most important indoor molds are Aspergillus and Penicillium.  Dark, humid and poorly ventilated basements are ideal sites for mold growth.  The next most common sites of mold growth are the bathroom and the kitchen.  Outdoor (Seasonal) Mold Control  Positive outdoor molds via skin testing: Alternaria and Cladosporium  Use air conditioning and keep windows closed Avoid exposure to decaying vegetation. Avoid leaf raking. Avoid grain handling. Consider wearing a face mask if working in moldy areas.    Indoor (Perennial) Mold Control   Positive indoor molds via skin testing: Fusarium, Aureobasidium (Pullulara), and Rhizopus  Maintain humidity below 50%. Clean washable surfaces with 5% bleach solution. Remove sources  e.g. contaminated carpets.

## 2021-10-26 NOTE — Progress Notes (Unsigned)
FOLLOW UP  Date of Service/Encounter:  10/26/21   Assessment:    Flexural atopic dermatitis    Mild persistent asthma, uncomplicated   Perennial and seasonal allergic rhinitis (grasses, weeds, indoor and outdoor molds)  SVT - on metoprolol    Anxiety - on Lexapro  Adverse reaction to Singulair (back pain)    Plan/Recommendations:   1. Perennial and seasonal allergic rhinitis - Testing was positive to grasses, weeds, indoor and outdoor molds (although they were very minimally reactive). - Copy of testing results provided. - You seem to have good handle on your symptoms. - Continue with Singulair (montelukast) 5 mg daily. - Continue with Flonase (fluticasone) 1 spray per nostril up to twice daily. - Allergy shots are an option, but he seems to have a good handle on her symptoms even without shots.  2. Mild persistent asthma, uncomplicated - Lung testing not done. - We are not going to make any medication changes. - Daily controller medication(s): Flovent 174mg 2 puffs once daily with spacer - Prior to physical activity: Xopenex 2 puffs 10-15 minutes before physical activity. - Rescue medications: Xopenex 4 puffs every 4-6 hours as needed - Changes during respiratory infections or worsening symptoms: Increase Flovent 1137m to 4 puffs twice daily for TWO WEEKS. - Asthma control goals:  * Full participation in all desired activities (may need albuterol before activity) * Albuterol use two time or less a week on average (not counting use with activity) * Cough interfering with sleep two time or less a month * Oral steroids no more than once a year * No hospitalizations  3. Return in about 6 months (around 04/26/2022).   Subjective:   Gail Levine is a [age].o. female presenting today for follow up of  Chief Complaint  Patient presents with   Allergic Rhinitis    Allergy Testing    Gail Levine has a history of the following: Patient Active Problem List   Diagnosis Date  Noted   Skin rash 04/12/2021   Allergies 04/12/2021   COVID-19 06/22/2020   History of COVID-19 03/16/2020   Anxiety 09/07/2018   Hyperglycemia 05/05/2017   Encounter for screening for malignant neoplasm of breast 01/20/2017   Gout 06/03/2016   Back pain 01/08/2016   Constipation 11/23/2015   HTN (hypertension), benign 11/23/2015   Chest pain 09/10/2015   Muscle spasm 09/09/2015   Palpitations 09/09/2015   Asthma 09/09/2015   Dermatitis 01/04/2015   Cervical cancer screening 01/01/2015   Estrogen deficient vulvovaginitis 09/14/2014   Malignant neoplasm of nipple of left breast in female, estrogen receptor negative ([facility]   Positive PPD    Hyperlipidemia    Pruritus 09/04/2014   Vitamin D deficiency    Thyroid disease     History obtained from: chart review and patient.  Gail Levine a [age].o. female presenting for a follow up visit.  We last saw her in July 2023.  At that time, we continue with Allegra and Claritin.  We added on Singulair as well.  She did not make the decision to do intradermal testing for more sensitive testing.  For her asthma, her lung testing looked good.  We started Flovent 2 puffs once daily.  Since last visit, she has done well. She reports that she was nervous and bouncing all over the place before she came in today.   Asthma/Respiratory Symptom History: She is on the Flovent two puffs once daily. Singulair has helped, but she takes only half of it. A  whole pill causes her back to hurt a lot and she is unable to do any work at all. There is variable pain when she takes half a pill but it helps so much with her symptoms that she decides to do it anyway.  She has not been in the hopistal for her breathing. But the Singulair definitely helps with her breathing.   Allergic Rhinitis Symptom History: Rhinitis is improving. She actually stopped her Claritin and Allegra and she is taking half a tablet of Singulair.  She did have a sinus infection that presented as a  tooth ache. Otherwise she has done well since the last visit.   Otherwise, there have been no changes to her past medical history, surgical history, family history, or social history.    Review of Systems  Constitutional: Negative.  Negative for chills, fever, malaise/fatigue and weight loss.  HENT: Negative.  Negative for congestion, ear discharge and ear pain.   Eyes:  Negative for pain, discharge and redness.  Respiratory:  Negative for cough, sputum production, shortness of breath and wheezing.   Cardiovascular: Negative.  Negative for chest pain and palpitations.  Gastrointestinal:  Negative for abdominal pain, constipation, diarrhea, heartburn, nausea and vomiting.  Skin: Negative.  Negative for itching and rash.  Neurological:  Negative for dizziness and headaches.  Endo/Heme/Allergies:  Negative for environmental allergies. Does not bruise/bleed easily.       Objective:   Blood pressure 130/80, pulse 64, resp. rate 17, SpO2 97 %. There is no height or weight on file to calculate BMI.    Physical Exam Vitals reviewed.  Constitutional:      Appearance: She is well-developed.  HENT:     Head: Normocephalic and atraumatic.     Right Ear: Tympanic membrane, ear canal and external ear normal. No drainage, swelling or tenderness. Tympanic membrane is not injected, scarred, erythematous, retracted or bulging.     Left Ear: Tympanic membrane, ear canal and external ear normal. No drainage, swelling or tenderness. Tympanic membrane is not injected, scarred, erythematous, retracted or bulging.     Nose: No nasal deformity, septal deviation, mucosal edema or rhinorrhea.     Right Turbinates: Enlarged, swollen and pale.     Left Turbinates: Enlarged, swollen and pale.     Right Sinus: No maxillary sinus tenderness or frontal sinus tenderness.     Left Sinus: No maxillary sinus tenderness or frontal sinus tenderness.     Comments: No nasal polyps noted.     Mouth/Throat:      Mouth: Mucous membranes are not pale and not dry.     Pharynx: Uvula midline.  Eyes:     General:        Right eye: No discharge.        Left eye: No discharge.     Conjunctiva/sclera: Conjunctivae normal.     Right eye: Right conjunctiva is not injected. No chemosis.    Left eye: Left conjunctiva is not injected. No chemosis.    Pupils: Pupils are equal, round, and reactive to light.  Cardiovascular:     Rate and Rhythm: Normal rate and regular rhythm.     Heart sounds: Normal heart sounds.  Pulmonary:     Effort: Pulmonary effort is normal. No tachypnea, accessory muscle usage or respiratory distress.     Breath sounds: Normal breath sounds. No wheezing, rhonchi or rales.  Chest:     Chest wall: No tenderness.  Lymphadenopathy:     Head:  Right side of head: No submandibular, tonsillar or occipital adenopathy.     Left side of head: No submandibular, tonsillar or occipital adenopathy.     Cervical: No cervical adenopathy.  Skin:    General: Skin is warm.     Capillary Refill: Capillary refill takes less than 2 seconds.     Coloration: Skin is not pale.     Findings: No abrasion, erythema, petechiae or rash. Rash is not papular, urticarial or vesicular.     Comments: She does have some roughened areas on her forearms with excoriations.   Neurological:     Mental Status: She is alert.  Psychiatric:        Behavior: Behavior is cooperative.      Diagnostic studies:   Allergy Studies:     Intradermal - 10/26/21 1431     Time Antigen Placed 1410    Allergen Manufacturer Lavella Hammock    Location Arm    Number of Test 15    Intradermal Select    Control Negative    [place] Negative    Johnson 1+    7 Grass Negative    Ragweed mix Negative    Weed mix 1+    Tree mix Negative    Mold 1 1+    Mold 2 Negative    Mold 3 Negative    Mold 4 1+    Cat Negative    Dog Negative    Cockroach Negative    Mite mix Negative             Allergy testing results were read  and interpreted by myself, documented by clinical staff.      Salvatore Marvel, MD  [facility]

## 2021-10-28 ENCOUNTER — Encounter: Payer: Self-pay | Admitting: Allergy & Immunology

## 2021-12-30 ENCOUNTER — Encounter: Payer: BC Managed Care – PPO | Admitting: Family Medicine

## 2022-01-30 ENCOUNTER — Other Ambulatory Visit: Payer: Self-pay | Admitting: Family Medicine

## 2022-02-22 ENCOUNTER — Other Ambulatory Visit: Payer: Self-pay | Admitting: Allergy & Immunology

## 2022-04-26 ENCOUNTER — Ambulatory Visit: Payer: Self-pay | Admitting: Allergy & Immunology

## 2022-05-28 ENCOUNTER — Other Ambulatory Visit: Payer: Self-pay | Admitting: Allergy & Immunology

## 2022-06-09 ENCOUNTER — Encounter: Payer: Self-pay | Admitting: Allergy & Immunology

## 2022-06-09 ENCOUNTER — Other Ambulatory Visit: Payer: Self-pay

## 2022-06-09 ENCOUNTER — Ambulatory Visit: Payer: Commercial Managed Care - HMO | Admitting: Allergy & Immunology

## 2022-06-09 VITALS — BP 114/76 | HR 56 | Temp 98.0°F | Resp 18 | Ht 59.0 in | Wt 133.1 lb

## 2022-06-09 DIAGNOSIS — L2089 Other atopic dermatitis: Secondary | ICD-10-CM

## 2022-06-09 DIAGNOSIS — J4530 Mild persistent asthma, uncomplicated: Secondary | ICD-10-CM

## 2022-06-09 DIAGNOSIS — J302 Other seasonal allergic rhinitis: Secondary | ICD-10-CM

## 2022-06-09 DIAGNOSIS — J3089 Other allergic rhinitis: Secondary | ICD-10-CM

## 2022-06-09 MED ORDER — MONTELUKAST SODIUM 10 MG PO TABS: ORAL_TABLET | ORAL | 5 refills | Status: DC

## 2022-06-09 MED ORDER — LEVALBUTEROL TARTRATE 45 MCG/ACT IN AERO: 1.0000 | INHALATION_SPRAY | Freq: Four times a day (QID) | RESPIRATORY_TRACT | 1 refills | Status: AC | PRN

## 2022-06-09 MED ORDER — FLUTICASONE PROPIONATE 50 MCG/ACT NA SUSP: 1.0000 | Freq: Two times a day (BID) | NASAL | 5 refills | Status: AC

## 2022-06-09 NOTE — Patient Instructions (Addendum)
1. Perennial and seasonal allergic rhinitis - Testing in the past was positive to grasses, weeds, indoor molds and outdoor molds (although they were very minimally reactive). - You seem to have good handle on your symptoms. - Continue with Singulair (montelukast) 5 mg daily. - Continue with Flonase (fluticasone) 1 spray per nostril up to twice daily. - Allergy shots are an option, but he seems to have a good handle on her symptoms even without shots.  2. Mild persistent asthma, uncomplicated - Lung testing looked great today. - I do not think that we need to make any medication changes.  - Daily controller medication(s): Singulair (montelukast) 5mg  daily - Prior to physical activity: Xopenex 2 puffs 10-15 minutes before physical activity. - Rescue medications: Xopenex 4 puffs every 4-6 hours as needed - Asthma control goals:  * Full participation in all desired activities (may need albuterol before activity) * Albuterol use two time or less a week on average (not counting use with activity) * Cough interfering with sleep two time or less a month * Oral steroids no more than once a year * No hospitalizations  3. Return in about 1 year (around 06/09/2023).    Please inform us of any Emergency Department visits, hospitalizations, or changes in symptoms. Call us before going to the ED for breathing or allergy symptoms since we might be able to fit you in for a sick visit. Feel free to contact us anytime with any questions, problems, or concerns.  It was a pleasure to Chay you today!  Websites that have reliable patient information: 1. American Academy of Asthma, Allergy, and Immunology: www.aaaai.org 2. Food Allergy Research and Education (FARE): foodallergy.org 3. Mothers of Asthmatics: http://www.asthmacommunitynetwork.org 4. American College of Allergy, Asthma, and Immunology: www.acaai.org   COVID-19 Vaccine Information can be found at:  [email or web address] For questions related to vaccine distribution or appointments, please email [email or web address] or call [phone].   We realize that you might be concerned about having an allergic reaction to the COVID19 vaccines. To help with that concern, WE ARE OFFERING THE COVID19 VACCINES IN OUR OFFICE! Ask the front desk for dates!     "Like" [place] on Facebook and Instagram for our latest updates!      A healthy democracy works best when [employer] participate! Make sure you are registered to vote! If you have moved or changed any of your contact information, you will need to get this updated before voting!  In some cases, you MAY be able to register to vote online: [email or web address]

## 2022-06-09 NOTE — Progress Notes (Signed)
FOLLOW UP  Date of Service/Encounter:  06/09/22   Assessment:   Flexural atopic dermatitis - well controlled    Mild persistent asthma, uncomplicated   Perennial and seasonal allergic rhinitis (grasses, weeds, indoor and outdoor molds)   SVT - on metoprolol    Anxiety - on Lexapro   Adverse reaction to Singulair (back pain)    Plan/Recommendations:   1. Perennial and seasonal allergic rhinitis - Testing in the past was positive to grasses, weeds, indoor molds and outdoor molds (although they were very minimally reactive). - You seem to have good handle on your symptoms. - Continue with Singulair (montelukast) 5 mg daily. - Continue with Flonase (fluticasone) 1 spray per nostril up to twice daily. - Allergy shots are an option, but he seems to have a good handle on her symptoms even without shots.  2. Mild persistent asthma, uncomplicated - Lung testing looked great today. - I do not think that we need to make any medication changes.  - Daily controller medication(s): Singulair (montelukast) 5mg  daily - Prior to physical activity: Xopenex 2 puffs 10-15 minutes before physical activity. - Rescue medications: Xopenex 4 puffs every 4-6 hours as needed - Asthma control goals:  * Full participation in all desired activities (may need albuterol before activity) * Albuterol use two time or less a week on average (not counting use with activity) * Cough interfering with sleep two time or less a month * Oral steroids no more than once a year * No hospitalizations  3. Return in about 1 year (around 06/09/2023).    Subjective:   Gail Levine is a 57 y.o. female presenting today for follow up of  Chief Complaint  Patient presents with   Other    Back pain has been causing issues with her breathing due to the pain    Urticaria    No issues     Gail Levine has a history of the following: Patient Active Problem List   Diagnosis Date Noted   Mild persistent asthma, uncomplicated  06/10/2022   Flexural atopic dermatitis 06/10/2022   Seasonal and perennial allergic rhinitis 06/10/2022   Skin rash 04/12/2021   Allergies 04/12/2021   COVID-19 06/22/2020   History of COVID-19 03/16/2020   Anxiety 09/07/2018   Hyperglycemia 05/05/2017   Encounter for screening for malignant neoplasm of breast 01/20/2017   Gout 06/03/2016   Back pain 01/08/2016   Constipation 11/23/2015   HTN (hypertension), benign 11/23/2015   Chest pain 09/10/2015   Muscle spasm 09/09/2015   Palpitations 09/09/2015   Asthma 09/09/2015   Dermatitis 01/04/2015   Cervical cancer screening 01/01/2015   Estrogen deficient vulvovaginitis 09/14/2014   Malignant neoplasm of nipple of left breast in female, estrogen receptor negative (HCC)    Positive PPD    Hyperlipidemia    Pruritus 09/04/2014   Vitamin D deficiency    Thyroid disease     History obtained from: chart review and patient.  Gail Levine is a 57 y.o. female presenting for a follow up visit.  She was last seen in September 2023.  At that time, she was positive to grasses, weeds, indoor molds, and outdoor molds. We continued with Singulair as well as Flonase.  For her asthma, we continue with Flovent 110 mcg 2 puffs once daily as well as Xopenex as needed.  Since last visit, she has done fairly well. She reports that her back is hurting for the past 3 days.   Asthma/Respiratory Symptom History: She has been coughing  a bit. This is part of her entire "cardio issue".  She sees Dr. Marcina Millard at [facility]. She is on metoprolol and HCTZ. She does report that this has been helping with her chest pain. She has been using Xopenex 2-3 times a day. This is consistent. She does not seem to remember Flovent. She has not needed prednisone in quite some time. She did have his during COVID19.  It seems that her symptoms have improved since recovering from COVID-19 a couple of years ago. She has been on Singulair 5mg . She had worsening back pain with the normal  dosing of the Singulair which is why we decreased it to the 5 mg dose.   Allergic Rhinitis Symptom History: For her allergic rhinitis, she is doing very well on the Flonase as well as the Singulair.  She is not interested in allergy shots.  She does not feel that she needs them at this point.  She has not been on antibiotics for any sinus or ear infections .  She did recently get a couple of cats.   Skin Symptom History: She remains on triamcinolone as needed.  Her skin is under fairly good control.  She has not needed systemic steroids for her skin.  She does have a history of SVT. She sees Dr. Marcina Millard at [facility].  She last saw him in March 2023.  At that time, she was having occasional episodes of nonexertional chest pain.  She was having occasional palpitations at night.  She had a 24-hour Holter monitor in August 2017 that showed normal sinus rhythm with occasional PACs. She underwent ETT on 09/10/2015, exercise 5 minutes and 10 seconds on a Bruce protocol, achieving 84% of max predicted heart rate, with chest discomfort, without ECG changes.  Her chest pain preceded her infection with COVID-19.  At the last visit, he recommended continuing low-dose metoprolol 25 mg daily as well as HCTZ 25 mg daily.  Otherwise, there have been no changes to her past medical history, surgical history, family history, or social history.    Review of Systems  Constitutional: Negative.  Negative for chills, fever, malaise/fatigue and weight loss.  HENT: Negative.  Negative for congestion, ear discharge and ear pain.   Eyes:  Negative for pain, discharge and redness.  Respiratory:  Negative for cough, sputum production, shortness of breath and wheezing.   Cardiovascular: Negative.  Negative for chest pain and palpitations.  Gastrointestinal:  Negative for abdominal pain, constipation, diarrhea, heartburn, nausea and vomiting.  Skin: Negative.  Negative for itching and rash.  Neurological:  Negative for  dizziness and headaches.  Endo/Heme/Allergies:  Negative for environmental allergies. Does not bruise/bleed easily.       Objective:   Blood pressure 114/76, pulse (!) 56, temperature 98 F (36.7 C), resp. rate 18, height 4\' 11"  (1.499 m), weight 133 lb 1.6 oz (60.4 kg), SpO2 97 %. Body mass index is 26.88 kg/m.    Physical Exam Vitals reviewed.  Constitutional:      Appearance: She is well-developed.  HENT:     Head: Normocephalic and atraumatic.     Right Ear: Tympanic membrane, ear canal and external ear normal. No drainage, swelling or tenderness. Tympanic membrane is not injected, scarred, erythematous, retracted or bulging.     Left Ear: Tympanic membrane, ear canal and external ear normal. No drainage, swelling or tenderness. Tympanic membrane is not injected, scarred, erythematous, retracted or bulging.     Nose: No nasal deformity, septal deviation, mucosal edema or rhinorrhea.  Right Turbinates: Enlarged, swollen and pale.     Left Turbinates: Enlarged, swollen and pale.     Right Sinus: No maxillary sinus tenderness or frontal sinus tenderness.     Left Sinus: No maxillary sinus tenderness or frontal sinus tenderness.     Comments: No nasal polyps noted.     Mouth/Throat:     Mouth: Mucous membranes are not pale and not dry.     Pharynx: Uvula midline.  Eyes:     General:        Right eye: No discharge.        Left eye: No discharge.     Conjunctiva/sclera: Conjunctivae normal.     Right eye: Right conjunctiva is not injected. No chemosis.    Left eye: Left conjunctiva is not injected. No chemosis.    Pupils: Pupils are equal, round, and reactive to light.  Cardiovascular:     Rate and Rhythm: Normal rate and regular rhythm.     Heart sounds: Normal heart sounds.  Pulmonary:     Effort: Pulmonary effort is normal. No tachypnea, accessory muscle usage or respiratory distress.     Breath sounds: Normal breath sounds. No wheezing, rhonchi or rales.  Chest:      Chest wall: No tenderness.  Lymphadenopathy:     Head:     Right side of head: No submandibular, tonsillar or occipital adenopathy.     Left side of head: No submandibular, tonsillar or occipital adenopathy.     Cervical: No cervical adenopathy.  Skin:    General: Skin is warm.     Capillary Refill: Capillary refill takes less than 2 seconds.     Coloration: Skin is not pale.     Findings: No abrasion, erythema, petechiae or rash. Rash is not papular, urticarial or vesicular.     Comments: She does have some roughened areas on her forearms with excoriations.   Neurological:     Mental Status: She is alert.  Psychiatric:        Behavior: Behavior is cooperative.      Diagnostic studies:    Spirometry: results normal (FEV1: 1.80/87%, FVC: 1.99/78%, FEV1/FVC: 90%).    Spirometry consistent with normal pattern.   Allergy Studies: none        Gail Bonds, MD  [facility]

## 2022-06-10 DIAGNOSIS — J302 Other seasonal allergic rhinitis: Secondary | ICD-10-CM | POA: Insufficient documentation

## 2022-06-10 DIAGNOSIS — L2089 Other atopic dermatitis: Secondary | ICD-10-CM | POA: Insufficient documentation

## 2022-06-10 DIAGNOSIS — J4530 Mild persistent asthma, uncomplicated: Secondary | ICD-10-CM | POA: Insufficient documentation

## 2022-06-10 MED ORDER — TRIAMCINOLONE ACETONIDE 0.1 % EX CREA: 1.0000 | TOPICAL_CREAM | Freq: Two times a day (BID) | CUTANEOUS | 3 refills | Status: AC

## 2022-07-27 ENCOUNTER — Telehealth: Payer: Self-pay

## 2022-07-27 NOTE — Telephone Encounter (Signed)
[facility] called due to them needing paperwork filled out before an upcoming procedure. I provided them with the [place] fax number paperwork will be printed from onbase once we receive it for Dr. Dellis Anes to review.

## 2022-08-01 ENCOUNTER — Telehealth: Payer: Self-pay | Admitting: *Deleted

## 2022-08-01 NOTE — Telephone Encounter (Signed)
[facility] faxed over form to be filled out but patient has not been seen since 2022.  Left message on machine for patient to call back to schedule appointment with someone for this to be filled out.

## 2022-08-02 NOTE — Telephone Encounter (Signed)
Signed.

## 2022-08-03 NOTE — Telephone Encounter (Signed)
Faxed completed Medical Clearance Form from the [facility] to [phone] - [phone].

## 2022-08-10 ENCOUNTER — Telehealth: Payer: Self-pay | Admitting: Allergy & Immunology

## 2022-08-10 NOTE — Telephone Encounter (Signed)
Patient called and stated that she needs 28 teeth extracted. Dr Hubert Azure who is performing extractions is requesting medical clearance from patients asthma doctor. Patient is requesting medical clearance letter to be sent to her mychart and she will deliver it to the dental surgeon. Patients call back number is [phone]

## 2022-08-16 NOTE — Telephone Encounter (Signed)
Called and spoke to patient and informed her of the message from Dr. Dellis Anes. Patient stated that she will go to her dentist and get the forms needed to be completed and bring them to our office to be completed. Patient advised that forms can take from five to seven business days. Patient verbalized understanding.

## 2022-08-16 NOTE — Telephone Encounter (Signed)
 Lm for pt to call us back about this °

## 2022-08-16 NOTE — Telephone Encounter (Signed)
Usually there is a form to fill out. Does the dentist have a specific form?   Malachi Bonds, MD [facility]

## 2022-08-29 ENCOUNTER — Other Ambulatory Visit: Payer: Self-pay | Admitting: Family Medicine

## 2022-08-31 ENCOUNTER — Encounter (INDEPENDENT_AMBULATORY_CARE_PROVIDER_SITE_OTHER): Payer: Self-pay

## 2022-09-19 ENCOUNTER — Encounter: Payer: Self-pay | Admitting: Family Medicine

## 2022-09-19 ENCOUNTER — Telehealth (INDEPENDENT_AMBULATORY_CARE_PROVIDER_SITE_OTHER): Payer: Commercial Managed Care - HMO | Admitting: Family Medicine

## 2022-09-19 ENCOUNTER — Telehealth: Payer: Self-pay

## 2022-09-19 DIAGNOSIS — M62838 Other muscle spasm: Secondary | ICD-10-CM

## 2022-09-19 DIAGNOSIS — E559 Vitamin D deficiency, unspecified: Secondary | ICD-10-CM | POA: Diagnosis not present

## 2022-09-19 DIAGNOSIS — I10 Essential (primary) hypertension: Secondary | ICD-10-CM

## 2022-09-19 DIAGNOSIS — K029 Dental caries, unspecified: Secondary | ICD-10-CM | POA: Insufficient documentation

## 2022-09-19 DIAGNOSIS — E782 Mixed hyperlipidemia: Secondary | ICD-10-CM

## 2022-09-19 DIAGNOSIS — J4530 Mild persistent asthma, uncomplicated: Secondary | ICD-10-CM

## 2022-09-19 DIAGNOSIS — E079 Disorder of thyroid, unspecified: Secondary | ICD-10-CM | POA: Diagnosis not present

## 2022-09-19 DIAGNOSIS — R739 Hyperglycemia, unspecified: Secondary | ICD-10-CM

## 2022-09-19 DIAGNOSIS — M109 Gout, unspecified: Secondary | ICD-10-CM

## 2022-09-19 MED ORDER — FEBUXOSTAT 40 MG PO TABS: 40.0000 mg | ORAL_TABLET | Freq: Every day | ORAL | 3 refills | Status: DC

## 2022-09-19 NOTE — Assessment & Plan Note (Signed)
Hydrate and monitor 

## 2022-09-19 NOTE — Assessment & Plan Note (Addendum)
She stopped her Allopurinol due to having trouble urinating but then developed a gout flare. Continue to hydrate well. Will try adding Uloric and check uric acid levels

## 2022-09-19 NOTE — Assessment & Plan Note (Signed)
Well controlled, no changes to meds. Encouraged heart healthy diet such as the DASH diet and exercise as tolerated.  °

## 2022-09-19 NOTE — Assessment & Plan Note (Signed)
Supplement and monitor 

## 2022-09-19 NOTE — Assessment & Plan Note (Signed)
No recent exacerbation, no changes 

## 2022-09-19 NOTE — Assessment & Plan Note (Addendum)
She  has advanced decay and they have decided to remove all of her teeth on September 28, 2022 then they will try some bone grafts and then implants just 2 teeth to start

## 2022-09-19 NOTE — Assessment & Plan Note (Signed)
Encourage heart healthy diet such as MIND or DASH diet, increase exercise, avoid trans fats, simple carbohydrates and processed foods, consider a krill or fish or flaxseed oil cap daily.  °

## 2022-09-19 NOTE — Assessment & Plan Note (Signed)
TSH WNL

## 2022-09-19 NOTE — Assessment & Plan Note (Signed)
hgba1c acceptable, minimize simple carbs. Increase exercise as tolerated.  

## 2022-09-19 NOTE — Progress Notes (Signed)
MyChart Video Visit    Virtual Visit via Video Note   This patient is at least at moderate risk for complications without adequate follow up. This format is felt to be most appropriate for this patient at this time. Physical exam was limited by quality of the video and audio technology used for the visit. Shamaine, CMA was able to get the patient set up on a video visit.  Patient location: home Patient and provider in visit Provider location: Office  I discussed the limitations of evaluation and management by telemedicine and the availability of in person appointments. The patient expressed understanding and agreed to proceed.  Visit Date: 09/19/2022  Today's healthcare provider: Danise Edge, MD     Subjective:    Patient ID: Gail Levine, female    DOB: 07-01-1965, 57 y.o.   MRN: [id number]  No chief complaint on file.   HPI Discussed the use of AI scribe software for clinical note transcription with the patient, who gave verbal consent to proceed.  History of Present Illness            Past Medical History:  Diagnosis Date   Acute bronchitis 09/09/2015   Allergy    Arthritis    Asthma    Asthma 09/09/2015   Back pain 01/08/2016   Breast cancer, left breast (HCC) 2007   Left Breast Cancer   Cancer (HCC) 2007   Cervical cancer screening 01/01/2015   Constipation 11/23/2015   Dermatitis 01/04/2015   Estrogen deficient vulvovaginitis 09/14/2014   Gout    Gout 06/03/2016   HIV infection (HCC)    HTN (hypertension), benign 11/23/2015   Hyperlipidemia    Positive PPD    Preventative health care 01/20/2017   Pruritus 09/04/2014   Thyroid disease    Urticaria    Vitamin D deficiency     Past Surgical History:  Procedure Laterality Date   MASTECTOMY Left     Family History  Problem Relation Age of Onset   Cancer Mother        cervical cancer   Hypertension Father    Stroke Father    Heart disease Father    Diabetes Father    Pulmonary fibrosis Father     Cancer Sister        cervical cancer    Social History   Socioeconomic History   Marital status: Single    Spouse name: Not on file   Number of children: Not on file   Years of education: Not on file   Highest education level: Not on file  Occupational History   Occupation: patient care coordinator  Tobacco Use   Smoking status: Never    Passive exposure: Never   Smokeless tobacco: Never  Vaping Use   Vaping status: Never Used  Substance and Sexual Activity   Alcohol use: Yes    Alcohol/week: 0.0 standard drinks of alcohol   Drug use: No   Sexual activity: Never  Other Topics Concern   Not on file  Social History Narrative   Not on file   Social Determinants of Health   Financial Resource Strain: Low Risk  (03/31/2017)   Received from [facility], [facility]   Overall Financial Resource Strain (CARDIA)    Difficulty of Paying Living Expenses: Not hard at all  Food Insecurity: Unknown (03/31/2017)   Received from [facility], [facility]   Hunger Vital Sign    Worried About Running Out  of Food in the Last Year: Patient declined    Ran Out of Food in the Last Year: Patient declined  Transportation Needs: Not on file  Physical Activity: Unknown (03/31/2017)   Received from [facility], [facility]   Exercise Vital Sign    Days of Exercise per Week: Patient declined    Minutes of Exercise per Session: Patient declined  Stress: Unknown (03/31/2017)   Received from [facility], [facility]   [employer] of Occupational Health - Occupational Stress Questionnaire    Feeling of Stress : Patient declined  Social Connections: Unknown (03/31/2017)   Received from [facility], [facility]   Social Connection and Isolation Panel [NHANES]    Frequency of Communication with Friends and Family: Patient  declined    Frequency of Social Gatherings with Friends and Family: Patient declined    Attends Religious Services: Patient declined    Database administrator or Organizations: Patient declined    Attends Banker Meetings: Patient declined    Marital Status: Patient declined  Catering manager Violence: Not on file    Outpatient Medications Prior to Visit  Medication Sig Dispense Refill   allopurinol (ZYLOPRIM) 100 MG tablet Take 1 tablet (100 mg total) by mouth daily as needed. 90 tablet 1   ALPRAZolam (XANAX) 0.25 MG tablet Take 1 tablet (0.25 mg total) by mouth 2 (two) times daily as needed for anxiety. 20 tablet 2   Ascorbic Acid (VITAMIN C) 1000 MG tablet Take 1,000 mg by mouth daily.     aspirin EC 81 MG tablet Take 1 tablet (81 mg total) by mouth daily.     b complex vitamins tablet Take 1 tablet by mouth daily.     calcium carbonate (OS-CAL) 600 MG TABS tablet Take 1,800 mg by mouth 2 (two) times daily with a meal.     diltiazem (CARDIZEM) 30 MG tablet Take 1 tablet (30 mg total) by mouth 3 (three) times daily.     diphenhydrAMINE (BENADRYL) 25 MG tablet Take 25 mg by mouth every 6 (six) hours as needed for itching.      escitalopram (LEXAPRO) 5 MG tablet TAKE 1 TABLET (5 MG TOTAL) BY MOUTH DAILY. 30 tablet 5   fluticasone (FLONASE) 50 MCG/ACT nasal spray Place 1 spray into both nostrils in the morning and at bedtime. 16 g 5   hydrochlorothiazide (HYDRODIURIL) 25 MG tablet Take 0.5 tablets (12.5 mg total) by mouth daily as needed. 1/2 tablet by mouth every day     influenza vac split quadrivalent PF (FLUARIX) 0.5 ML injection Inject into the muscle. 0.5 mL 0   ketoconazole (NIZORAL) 2 % shampoo Apply 1 application topically 2 (two) times a week. 120 mL 1   levalbuterol (XOPENEX HFA) 45 MCG/ACT inhaler Inhale 1 puff into the lungs every 6 (six) hours as needed for wheezing or shortness of breath. 15 g 1   metoprolol succinate (TOPROL-XL) 25 MG 24 hr tablet Take 1 tablet by  mouth daily.     montelukast (SINGULAIR) 10 MG tablet TAKE 1 TABLET BY MOUTH EVERYDAY AT BEDTIME 30 tablet 5   Multiple Vitamin (MULTIVITAMIN) tablet Take 1 tablet by mouth daily.     SUMAtriptan (IMITREX) 100 MG tablet Take 1 tablet (100 mg total) by mouth every 2 (two) hours as needed for migraine. May repeat in 2 hours if headache persists or recurs. 10 tablet 3   triamcinolone cream (  KENALOG) 0.1 % Apply 1 Application topically 2 (two) times daily. Apply twice daily to affected area. 30 g 3   Vitamin D, Ergocalciferol, (DRISDOL) 1.25 MG (50000 UT) CAPS capsule Take 1 capsule (50,000 Units total) by mouth every 7 (seven) days. 4 capsule 5   No facility-administered medications prior to visit.    Allergies  Allergen Reactions   Sulfa Antibiotics Anaphylaxis and Other ([place] Comments)   Penicillins Rash    Review of Systems  Constitutional:  Negative for fever and malaise/fatigue.  HENT:  Negative for congestion.   Eyes:  Negative for blurred vision.  Respiratory:  Negative for shortness of breath.   Cardiovascular:  Negative for chest pain, palpitations and leg swelling.  Gastrointestinal:  Negative for abdominal pain, blood in stool and nausea.  Genitourinary:  Negative for dysuria and frequency.  Musculoskeletal:  Positive for joint pain. Negative for falls.  Skin:  Negative for rash.  Neurological:  Negative for dizziness, loss of consciousness and headaches.  Endo/Heme/Allergies:  Negative for environmental allergies.  Psychiatric/Behavioral:  Negative for depression. The patient is not nervous/anxious.        Objective:    Physical Exam Constitutional:      General: She is not in acute distress.    Appearance: Normal appearance. She is not ill-appearing or toxic-appearing.  HENT:     Head: Normocephalic and atraumatic.     Right Ear: External ear normal.     Left Ear: External ear normal.     Nose: Nose normal.  Eyes:     General:        Right eye: No discharge.         Left eye: No discharge.  Pulmonary:     Effort: Pulmonary effort is normal.  Skin:    Findings: No rash.  Neurological:     Mental Status: She is alert and oriented to person, place, and time.  Psychiatric:        Behavior: Behavior normal.    There were no vitals taken for this visit. Wt Readings from Last 3 Encounters:  06/09/22 133 lb 1.6 oz (60.4 kg)  05/06/21 127 lb (57.6 kg)  11/23/20 124 lb 9.6 oz (56.5 kg)       Assessment & Plan:  Vitamin D deficiency Assessment & Plan: Supplement and monitor   Thyroid disease Assessment & Plan: TSH WNL   Muscle spasm Assessment & Plan: Hydrate and monitor    Mild persistent asthma, uncomplicated Assessment & Plan: No recent exacerbation, no changes   Hyperglycemia Assessment & Plan: hgba1c acceptable, minimize simple carbs. Increase exercise as tolerated.    HTN (hypertension), benign Assessment & Plan: Well controlled, no changes to meds. Encouraged heart healthy diet such as the DASH diet and exercise as tolerated.    Gout, unspecified cause, unspecified chronicity, unspecified site Assessment & Plan: Asymptomatic on Allopurinol. Continue to hydrate well      Assessment and Plan              I discussed the assessment and treatment plan with the patient. The patient was provided an opportunity to ask questions and all were answered. The patient agreed with the plan and demonstrated an understanding of the instructions.   The patient was advised to call back or seek an in-person evaluation if the symptoms worsen or if the condition fails to improve as anticipated.  Danise Edge, MD [facility] [phone] (phone) [phone] (fax)  [facility]

## 2022-09-19 NOTE — Telephone Encounter (Signed)
Sent pt mychart message

## 2022-09-23 ENCOUNTER — Other Ambulatory Visit (INDEPENDENT_AMBULATORY_CARE_PROVIDER_SITE_OTHER): Payer: Commercial Managed Care - HMO

## 2022-09-23 DIAGNOSIS — R739 Hyperglycemia, unspecified: Secondary | ICD-10-CM

## 2022-09-23 DIAGNOSIS — M62838 Other muscle spasm: Secondary | ICD-10-CM | POA: Diagnosis not present

## 2022-09-23 DIAGNOSIS — E559 Vitamin D deficiency, unspecified: Secondary | ICD-10-CM | POA: Diagnosis not present

## 2022-09-23 DIAGNOSIS — E079 Disorder of thyroid, unspecified: Secondary | ICD-10-CM

## 2022-09-23 DIAGNOSIS — M109 Gout, unspecified: Secondary | ICD-10-CM

## 2022-09-23 DIAGNOSIS — I10 Essential (primary) hypertension: Secondary | ICD-10-CM

## 2022-09-23 LAB — COMPREHENSIVE METABOLIC PANEL
ALT: 21 U/L (ref 0–35)
AST: 32 U/L (ref 0–37)
Albumin: 4 g/dL (ref 3.5–5.2)
Alkaline Phosphatase: 71 U/L (ref 39–117)
BUN: 14 mg/dL (ref 6–23)
CO2: 32 mEq/L (ref 19–32)
Calcium: 9 mg/dL (ref 8.4–10.5)
Chloride: 99 mEq/L (ref 96–112)
Creatinine, Ser: 0.74 mg/dL (ref 0.40–1.20)
GFR: 91.48 mL/min (ref >60.00)
Glucose, Bld: 152 mg/dL — ABNORMAL HIGH (ref 70–99)
Potassium: 3.7 mEq/L (ref 3.5–5.1)
Sodium: 141 mEq/L (ref 135–145)
Total Bilirubin: 0.7 mg/dL (ref 0.2–1.2)
Total Protein: 7.9 g/dL (ref 6.0–8.3)

## 2022-09-23 LAB — CBC WITH DIFFERENTIAL/PLATELET
Basophils Absolute: 0 10*3/uL (ref 0.0–0.1)
Basophils Relative: 0.7 % (ref 0.0–3.0)
Eosinophils Absolute: 0.3 10*3/uL (ref 0.0–0.7)
Eosinophils Relative: 6.9 % — ABNORMAL HIGH (ref 0.0–5.0)
HCT: 39.9 % (ref 36.0–46.0)
Hemoglobin: 12.9 g/dL (ref 12.0–15.0)
Lymphocytes Relative: 37.2 % (ref 12.0–46.0)
Lymphs Abs: 1.8 10*3/uL (ref 0.7–4.0)
MCHC: 32.4 g/dL (ref 30.0–36.0)
MCV: 93.1 fl (ref 78.0–100.0)
Monocytes Absolute: 0.5 10*3/uL (ref 0.1–1.0)
Monocytes Relative: 9.5 % (ref 3.0–12.0)
Neutro Abs: 2.2 10*3/uL (ref 1.4–7.7)
Neutrophils Relative %: 45.7 % (ref 43.0–77.0)
Platelets: 222 10*3/uL (ref 150.0–400.0)
RBC: 4.29 Mil/uL (ref 3.87–5.11)
RDW: 13.5 % (ref 11.5–15.5)
WBC: 4.8 10*3/uL (ref 4.0–10.5)

## 2022-09-23 LAB — MAGNESIUM: Magnesium: 2 mg/dL (ref 1.5–2.5)

## 2022-09-23 LAB — LIPID PANEL
Cholesterol: 235 mg/dL — ABNORMAL HIGH (ref 0–200)
HDL: 57.6 mg/dL
LDL Cholesterol: 155 mg/dL — ABNORMAL HIGH (ref 0–99)
NonHDL: 177.34
Total CHOL/HDL Ratio: 4
Triglycerides: 113 mg/dL (ref 0.0–149.0)
VLDL: 22.6 mg/dL (ref 0.0–40.0)

## 2022-09-23 LAB — TSH: TSH: 4.3 u[IU]/mL (ref 0.35–5.50)

## 2022-09-23 LAB — URIC ACID: Uric Acid, Serum: 7.8 mg/dL — ABNORMAL HIGH (ref 2.4–7.0)

## 2022-09-23 LAB — VITAMIN D 25 HYDROXY (VIT D DEFICIENCY, FRACTURES): VITD: 32.65 ng/mL (ref 30.00–100.00)

## 2022-09-23 LAB — HEMOGLOBIN A1C: Hgb A1c MFr Bld: 5.6 % (ref 4.6–6.5)

## 2022-09-23 LAB — T4, FREE: Free T4: 0.68 ng/dL (ref 0.60–1.60)

## 2023-02-01 ENCOUNTER — Other Ambulatory Visit: Payer: Self-pay | Admitting: Family Medicine

## 2023-02-02 ENCOUNTER — Telehealth: Payer: Self-pay

## 2023-02-02 NOTE — Telephone Encounter (Signed)
 Your PA request has been closed. No PA required at this time. Plan allows max of 30 days supply. Prior Authorization expires on 02/01/26. Please have the pharmacy process the request. Almetta Lovely, Prior Auth Operations 02/02/2023 11:40 AM

## 2023-02-02 NOTE — Telephone Encounter (Signed)
 PA initiated via Covermymeds; KEY: [id number]. Awaiting determination.

## 2023-03-01 ENCOUNTER — Other Ambulatory Visit: Payer: Self-pay | Admitting: Allergy & Immunology

## 2023-03-01 ENCOUNTER — Other Ambulatory Visit: Payer: Self-pay | Admitting: Family Medicine

## 2023-05-03 ENCOUNTER — Other Ambulatory Visit: Payer: Self-pay | Admitting: Family Medicine

## 2023-06-10 ENCOUNTER — Other Ambulatory Visit: Payer: Self-pay | Admitting: Family Medicine

## 2023-06-13 ENCOUNTER — Ambulatory Visit: Payer: Commercial Managed Care - HMO | Admitting: Allergy & Immunology

## 2023-06-27 ENCOUNTER — Encounter: Payer: Self-pay | Admitting: Allergy & Immunology

## 2023-06-27 ENCOUNTER — Other Ambulatory Visit: Payer: Self-pay

## 2023-06-27 ENCOUNTER — Ambulatory Visit: Admitting: Allergy & Immunology

## 2023-06-27 VITALS — BP 120/74 | HR 54 | Temp 97.3°F | Ht 59.0 in | Wt 125.2 lb

## 2023-06-27 DIAGNOSIS — J4530 Mild persistent asthma, uncomplicated: Secondary | ICD-10-CM

## 2023-06-27 DIAGNOSIS — J302 Other seasonal allergic rhinitis: Secondary | ICD-10-CM

## 2023-06-27 DIAGNOSIS — L2089 Other atopic dermatitis: Secondary | ICD-10-CM | POA: Diagnosis not present

## 2023-06-27 DIAGNOSIS — J3089 Other allergic rhinitis: Secondary | ICD-10-CM

## 2023-06-27 NOTE — Patient Instructions (Addendum)
 1. Perennial and seasonal allergic rhinitis - Testing in the past was positive to grasses, weeds, indoor molds and outdoor molds (although they were very minimally reactive). - You seem to have good handle on your symptoms. - Continue with Singulair  (montelukast ) 5 mg daily. - Continue with Flonase  (fluticasone ) 1 spray per nostril up to twice daily. - Allergy  shots are an option, but he seems to have a good handle on her symptoms even without shots.  2. Mild persistent asthma, uncomplicated - Lung testing looked great today. - I do not think that we need to make any medication changes.  - We can add on a daily inhaler if you think that this is needed. - Daily controller medication(s): Singulair  (montelukast ) 5mg  daily - Prior to physical activity: Xopenex  2 puffs 10-15 minutes before physical activity. - Rescue medications: Xopenex  4 puffs every 4-6 hours as needed - Asthma control goals:  * Full participation in all desired activities (may need albuterol  before activity) * Albuterol  use two time or less a week on average (not counting use with activity) * Cough interfering with sleep two time or less a month * Oral steroids no more than once a year * No hospitalizations.  3. Return in about 1 year (around 06/26/2024). You can have the follow up appointment with Dr. Idolina Maker or a Nurse Practicioner (our Nurse Practitioners are excellent and always have Physician oversight!).    Please inform us  of any Emergency Department visits, hospitalizations, or changes in symptoms. Call us  before going to the ED for breathing or allergy  symptoms since we might be able to fit you in for a sick visit. Feel free to contact us  anytime with any questions, problems, or concerns.  It was a pleasure to [place] you again today!  Websites that have reliable patient information: 1. American Academy of Asthma, Allergy , and Immunology: www.aaaai.org 2. Food Allergy  Research and Education (FARE):  foodallergy.org 3. Mothers of Asthmatics: http://www.asthmacommunitynetwork.org 4. American College of Allergy , Asthma, and Immunology: www.acaai.org      "Like" us  on Facebook and Instagram for our latest updates!      A healthy democracy works best when [employer] participate! Make sure you are registered to vote! If you have moved or changed any of your contact information, you will need to get this updated before voting! Scan the QR codes below to learn more!

## 2023-06-27 NOTE — Progress Notes (Unsigned)
 FOLLOW UP  Date of Service/Encounter:  06/27/23   Assessment:   Flexural atopic dermatitis - well controlled    Mild persistent asthma, uncomplicated   Perennial and seasonal allergic rhinitis (grasses, weeds, indoor and outdoor molds)   SVT - on metoprolol    Anxiety - on Lexapro    Adverse reaction to Singulair  (back pain), but tolerates the Singulair    Recent removal of all of her teeth secondary to complications from cancer treatment (breast cancer in 2007)    Plan/Recommendations:   Patient Instructions  1. Perennial and seasonal allergic rhinitis - Testing in the past was positive to grasses, weeds, indoor molds and outdoor molds (although they were very minimally reactive). - You seem to have good handle on your symptoms. - Continue with Singulair  (montelukast ) 5 mg daily. - Continue with Flonase  (fluticasone ) 1 spray per nostril up to twice daily. - Allergy  shots are an option, but he seems to have a good handle on her symptoms even without shots.  2. Mild persistent asthma, uncomplicated - Lung testing looked great today. - I do not think that we need to make any medication changes.  - We can add on a daily inhaler if you think that this is needed. - Daily controller medication(s): Singulair  (montelukast ) 5mg  daily - Prior to physical activity: Xopenex  2 puffs 10-15 minutes before physical activity. - Rescue medications: Xopenex  4 puffs every 4-6 hours as needed - Asthma control goals:  * Full participation in all desired activities (may need albuterol  before activity) * Albuterol  use two time or less a week on average (not counting use with activity) * Cough interfering with sleep two time or less a month * Oral steroids no more than once a year * No hospitalizations.  3. Return in about 1 year (around 06/26/2024). You can have the follow up appointment with Dr. Idolina Maker or a Nurse Practicioner (our Nurse Practitioners are excellent and always have Physician  oversight!).    Please inform us  of any Emergency Department visits, hospitalizations, or changes in symptoms. Call us  before going to the ED for breathing or allergy  symptoms since we might be able to fit you in for a sick visit. Feel free to contact us  anytime with any questions, problems, or concerns.  It was a pleasure to [place] you again today!  Websites that have reliable patient information: 1. American Academy of Asthma, Allergy , and Immunology: www.aaaai.org 2. Food Allergy  Research and Education (FARE): foodallergy.org 3. Mothers of Asthmatics: http://www.asthmacommunitynetwork.org 4. [employer] of Allergy , Asthma, and Immunology: www.acaai.org      "Like" us  on Facebook and Instagram for our latest updates!      A healthy democracy works best when [employer] participate! Make sure you are registered to vote! If you have moved or changed any of your contact information, you will need to get this updated before voting! Scan the QR codes below to learn more!             Subjective:   Nadezhda Dishner is a 58 y.o. female presenting today for follow up of  Chief Complaint  Patient presents with   Asthma   Eczema   Seasonal and perennial allergic rhinitis   Follow-up    Yearly follow up.    Katti Disbrow has a history of the following: Patient Active Problem List   Diagnosis Date Noted   Dental decay 09/19/2022   Mild persistent asthma, uncomplicated 06/10/2022   Flexural atopic dermatitis 06/10/2022   Seasonal and perennial allergic  rhinitis 06/10/2022   Skin rash 04/12/2021   Allergies 04/12/2021   History of COVID-19 03/16/2020   Anxiety 09/07/2018   Hyperglycemia 05/05/2017   Encounter for screening for malignant neoplasm of breast 01/20/2017   Gout 06/03/2016   Back pain 01/08/2016   Constipation 11/23/2015   HTN (hypertension), benign 11/23/2015   Chest pain 09/10/2015   Muscle spasm 09/09/2015   Palpitations 09/09/2015   Cervical cancer screening  01/01/2015   Estrogen deficient vulvovaginitis 09/14/2014   Malignant neoplasm of nipple of left breast in female, estrogen receptor negative (HCC)    Positive PPD    Hyperlipidemia    Pruritus 09/04/2014   Vitamin D  deficiency    Thyroid  disease     History obtained from: chart review and {Persons; PED relatives w/patient:19415::"patient"}.  Discussed the use of AI scribe software for clinical note transcription with the patient and/or guardian, who gave verbal consent to proceed.  Airlie is a 58 y.o. female presenting for {Blank single:19197::"a food challenge","a drug challenge","skin testing","a sick visit","an evaluation of ***","a follow up visit"}.  She was last seen in May 2024.  At that time, we continue with Singulair  as well as Flonase .  For her asthma, we continue with the Singulair  as well as Xopenex .  We had to decrease the Singulair  to the 5 mg dose because of a side effect of back pain on the 10 mg dose.  Since last visit,  Asthma/Respiratory Symptom History: ***  Allergic Rhinitis Symptom History: ***  Food Allergy  Symptom History: ***  Skin Symptom History: ***  GERD Symptom History: ***  Infection Symptom History: ***  She does have a history of SVT. She sees Dr. Percival [facility] at [facility].  She last saw him in March 2023.  At that time, she was having occasional episodes of nonexertional chest pain.  She was having occasional palpitations at night.  She had a 24-hour Holter monitor in August 2017 that showed normal sinus rhythm with occasional PACs. She underwent ETT on 09/10/2015, exercise 5 minutes and 10 seconds on a Bruce protocol, achieving 84% of max predicted heart rate, with chest discomfort, without ECG changes.  Her chest pain preceded her infection with COVID-19.  At the last visit, he recommended continuing low-dose metoprolol 25 mg daily as well as HCTZ 25 mg daily.    Otherwise, there have been no changes to her past medical history, surgical history,  family history, or social history.    Review of systems otherwise negative other than that mentioned in the HPI.    Objective:   Blood pressure 120/74, pulse (!) 54, temperature (!) 97.3 F (36.3 C), temperature source Temporal, height 4\' 11"  (1.499 m), weight 125 lb 3.2 oz (56.8 kg), SpO2 99%. Body mass index is 25.29 kg/m.    Physical Exam   Diagnostic studies:    Spirometry: results normal (FEV1: 1.68/82%, FVC: 1.97/78%, FEV1/FVC: 85%).    Spirometry consistent with normal pattern. {Blank single:19197::"Albuterol /Atrovent nebulizer","Xopenex /Atrovent nebulizer","Albuterol  nebulizer","Albuterol  four puffs via MDI","Xopenex  four puffs via MDI"} treatment given in clinic with {Blank single:19197::"significant improvement in FEV1 per ATS criteria","significant improvement in FVC per ATS criteria","significant improvement in FEV1 and FVC per ATS criteria","improvement in FEV1, but not significant per ATS criteria","improvement in FVC, but not significant per ATS criteria","improvement in FEV1 and FVC, but not significant per ATS criteria","no improvement"}.  Allergy  Studies: {Blank single:19197::"none","deferred due to recent antihistamine use","deferred due to insurance stipulations that require a separate visit for testing","labs sent instead"," "}    {Blank single:19197::"Allergy  testing results  were read and interpreted by myself, documented by clinical staff."," "}      Drexel Gentles, MD  [facility] 

## 2023-06-30 DIAGNOSIS — J4540 Moderate persistent asthma, uncomplicated: Secondary | ICD-10-CM | POA: Diagnosis not present

## 2023-06-30 DIAGNOSIS — R42 Dizziness and giddiness: Secondary | ICD-10-CM | POA: Diagnosis not present

## 2023-06-30 DIAGNOSIS — R079 Chest pain, unspecified: Secondary | ICD-10-CM | POA: Diagnosis not present

## 2023-06-30 DIAGNOSIS — R600 Localized edema: Secondary | ICD-10-CM | POA: Diagnosis not present

## 2023-06-30 DIAGNOSIS — R002 Palpitations: Secondary | ICD-10-CM | POA: Diagnosis not present

## 2023-06-30 DIAGNOSIS — R0602 Shortness of breath: Secondary | ICD-10-CM | POA: Diagnosis not present

## 2023-06-30 DIAGNOSIS — I10 Essential (primary) hypertension: Secondary | ICD-10-CM | POA: Diagnosis not present

## 2023-07-08 ENCOUNTER — Other Ambulatory Visit: Payer: Self-pay | Admitting: Family Medicine

## 2023-08-04 ENCOUNTER — Other Ambulatory Visit: Payer: Self-pay | Admitting: Family Medicine

## 2023-08-08 ENCOUNTER — Other Ambulatory Visit: Payer: Self-pay | Admitting: Family Medicine

## 2023-09-07 ENCOUNTER — Other Ambulatory Visit: Payer: Self-pay | Admitting: Family Medicine

## 2023-09-07 ENCOUNTER — Other Ambulatory Visit: Payer: Self-pay | Admitting: Allergy & Immunology

## 2023-09-27 NOTE — Assessment & Plan Note (Signed)
 Stable on Uloric .  Denies recent flares.  Update uric acid today.

## 2023-09-27 NOTE — Assessment & Plan Note (Signed)
 Stable on current medications.  Use of alprazolam  as needed, sparingly.

## 2023-09-27 NOTE — Assessment & Plan Note (Signed)
 TSH within normal limits

## 2023-09-27 NOTE — Assessment & Plan Note (Signed)
 Well controlled, no changes to meds. Encouraged heart healthy diet such as the DASH diet and exercise as tolerated.

## 2023-09-27 NOTE — Assessment & Plan Note (Signed)
 hgba1c acceptable, minimize simple carbs. Increase exercise as tolerated.

## 2023-09-27 NOTE — Assessment & Plan Note (Signed)
 Encourage heart healthy diet such as MIND or DASH diet, increase exercise, avoid trans fats, simple carbohydrates and processed foods, consider a krill or fish or flaxseed oil cap daily.

## 2023-09-27 NOTE — Assessment & Plan Note (Signed)
 Follows with [facility] .  No recent exacerbations.  Stable on current medications.

## 2023-09-27 NOTE — Progress Notes (Unsigned)
 Subjective:     Patient ID: Gail Levine, female    DOB: 1965/04/15, 58 y.o.   MRN: [id number]  No chief complaint on file.   HPI  Gail Levine is a 58 year old female presents for follow-up chronic conditions. PMHx-breast cancer, HTN, HLD, thyroid  disease, mild asthma  Follows with cardiology, allergies  Was seen by cardiology May 2025-seen by Ammon Blunt, MD -[facility].  Was evaluated for dizziness and heart palpitations.  Hypertension HCTZ 25 mg 0.5 tablets by mouth daily as needed, metoprolol 25 mg daily  Anxiety and depression Lexapro  5 mg daily, alprazolam  as needed  Migraine Sumatriptan -- ?? MIGRAINES  History of gout Febuxostat  (ULORIC )40 mg daily***??  HCM Needs to update colonoscopy, pap, and mgm  Patient denies fever, chills, SOB, CP, palpitations, dyspnea, edema, HA, vision changes, N/V/D, abdominal pain, urinary symptoms, rash, weight changes, and recent illness or hospitalizations.   History of Present Illness              Health Maintenance Due  Topic Date Due   HIV Screening  Never done   Hepatitis C Screening  Never done   Pneumococcal Vaccine: 50+ Years (1 of 2 - PCV) Never done   Hepatitis B Vaccines 19-59 Average Risk (1 of 3 - 19+ 3-dose series) Never done   Zoster Vaccines- Shingrix (1 of 2) Never done   Colonoscopy  Never done   Cervical Cancer Screening (HPV/Pap Cotest)  03/02/2021   MAMMOGRAM  06/01/2021   COVID-19 Vaccine (5 - 2024-25 season) 10/02/2022   INFLUENZA VACCINE  09/01/2023    Past Medical History:  Diagnosis Date   Acute bronchitis 09/09/2015   Allergy     Arthritis    Asthma    Asthma 09/09/2015   Back pain 01/08/2016   Breast cancer, left breast (HCC) 2007   Left Breast Cancer   Cancer (HCC) 2007   Cervical cancer screening 01/01/2015   Constipation 11/23/2015   Dermatitis 01/04/2015   Estrogen deficient vulvovaginitis 09/14/2014   Gout    Gout 06/03/2016   HIV infection (HCC)     HTN (hypertension), benign 11/23/2015   Hyperlipidemia    Positive PPD    Preventative health care 01/20/2017   Pruritus 09/04/2014   Thyroid  disease    Urticaria    Vitamin D  deficiency     Past Surgical History:  Procedure Laterality Date   MASTECTOMY Left     Family History  Problem Relation Age of Onset   Cancer Mother        cervical cancer   Hypertension Father    Stroke Father    Heart disease Father    Diabetes Father    Pulmonary fibrosis Father    Cancer Sister        cervical cancer    Social History   Socioeconomic History   Marital status: Single    Spouse name: Not on file   Number of children: Not on file   Years of education: Not on file   Highest education level: Not on file  Occupational History   Occupation: patient care coordinator  Tobacco Use   Smoking status: Never    Passive exposure: Never   Smokeless tobacco: Never  Vaping Use   Vaping status: Never Used  Substance and Sexual Activity   Alcohol use: Yes    Alcohol/week: 0.0 standard drinks of alcohol   Drug use: No   Sexual activity: Never  Other Topics Concern   Not on file  Social  History Narrative   Not on file   Social Drivers of Health   Financial Resource Strain: Low Risk  (03/31/2017)   Received from [facility]   Overall Financial Resource Strain (CARDIA)    Difficulty of Paying Living Expenses: Not hard at all  Food Insecurity: Unknown (03/31/2017)   Received from [facility]   Hunger Vital Sign    Worried About Running Out of Food in the Last Year: Patient declined    Ran Out of Food in the Last Year: Patient declined  Transportation Needs: Not on file  Physical Activity: Unknown (03/31/2017)   Received from [facility]   Exercise Vital Sign    Days of Exercise per Week: Patient declined    Minutes of Exercise per Session: Patient declined  Stress: Unknown (03/31/2017)   Received from [facility] - Occupational Stress Questionnaire    Feeling of Stress : Patient declined  Social Connections: Unknown (03/31/2017)   Received from [facility]   Social Connection and Isolation Panel    Frequency of Communication with Friends and Family: Patient declined    Frequency of Social Gatherings with Friends and Family: Patient declined    Attends Religious Services: Patient declined    Database administrator or Organizations: Patient declined    Attends Banker Meetings: Patient declined    Marital Status: Patient declined  Catering manager Violence: Not on file    Outpatient Medications Prior to Visit  Medication Sig Dispense Refill   ALPRAZolam  (XANAX ) 0.25 MG tablet Take 1 tablet (0.25 mg total) by mouth 2 (two) times daily as needed for anxiety. 20 tablet 2   Ascorbic Acid (VITAMIN C) 1000 MG tablet Take 1,000 mg by mouth daily.     aspirin  EC 81 MG tablet Take 1 tablet (81 mg total) by mouth daily.     b complex vitamins tablet Take 1 tablet by mouth daily.     calcium carbonate (OS-CAL) 600 MG TABS tablet Take 1,800 mg by mouth 2 (two) times daily with a meal.     dexamethasone (DECADRON) 4 MG tablet Take 4 mg by mouth 3 (three) times daily as needed.     diltiazem (CARDIZEM) 30 MG tablet Take 1 tablet (30 mg total) by mouth 3 (three) times daily.     diphenhydrAMINE  (BENADRYL ) 25 MG tablet Take 25 mg by mouth every 6 (six) hours as needed for itching.      escitalopram  (LEXAPRO ) 5 MG tablet TAKE 1 TABLET (5 MG TOTAL) BY MOUTH DAILY. 30 tablet 1   febuxostat  (ULORIC ) 40 MG tablet Take 1 tablet (40 mg total) by mouth daily. 90 tablet 0   fexofenadine (ALLEGRA ALLERGY ) 180 MG tablet Take 180 mg by mouth daily.     fluticasone  (FLONASE ) 50 MCG/ACT nasal spray Place 1 spray into both nostrils in the morning and at bedtime. 16 g 5   hydrochlorothiazide  (HYDRODIURIL ) 25 MG tablet Take 0.5 tablets (12.5 mg total)  by mouth daily as needed. 1/2 tablet by mouth every day     ketoconazole  (NIZORAL ) 2 % shampoo Apply 1 application topically 2 (two) times a week. 120 mL 1   levalbuterol  (XOPENEX  HFA) 45 MCG/ACT inhaler Inhale 1 puff into the lungs every 6 (six) hours as needed for wheezing or shortness of breath. 15 g 1   metoprolol succinate (TOPROL-XL) 25 MG 24 hr tablet Take 1 tablet  by mouth daily.     montelukast  (SINGULAIR ) 10 MG tablet TAKE 1 TABLET BY MOUTH EVERYDAY AT BEDTIME 30 tablet 5   Multiple Vitamin (MULTIVITAMIN) tablet Take 1 tablet by mouth daily.     SUMAtriptan  (IMITREX ) 100 MG tablet Take 1 tablet (100 mg total) by mouth every 2 (two) hours as needed for migraine. May repeat in 2 hours if headache persists or recurs. 10 tablet 3   triamcinolone  cream (KENALOG ) 0.1 % Apply 1 Application topically 2 (two) times daily. Apply twice daily to affected area. 30 g 3   Vitamin D , Ergocalciferol , (DRISDOL ) 1.25 MG (50000 UT) CAPS capsule Take 1 capsule (50,000 Units total) by mouth every 7 (seven) days. 4 capsule 5   No facility-administered medications prior to visit.    Allergies  Allergen Reactions   Sulfa Antibiotics Anaphylaxis and Other ([place]aire Comments)   Penicillins Rash    ROS    [place] HPI Objective:    Physical Exam  General: No acute distress. Awake and conversant.  Eyes: Normal conjunctiva, anicteric. Round symmetric pupils.  ENT: Hearing grossly intact. No nasal discharge.  Neck: Neck is supple. No masses or thyromegaly.  Respiratory: CTAB. Respirations are non-labored. No wheezing.  Skin: Warm. No rashes or ulcers.  Psych: Alert and oriented. Cooperative, Appropriate mood and affect, Normal judgment.  CV: RRR. No murmur. No lower extremity edema.  MSK: Normal ambulation. No clubbing or cyanosis.  Neuro:  CN II-XII grossly normal.     There were no vitals taken for this visit. Wt Readings from Last 3 Encounters:  06/27/23 125 lb 3.2 oz (56.8 kg)  06/09/22 133 lb 1.6 oz  (60.4 kg)  05/06/21 127 lb (57.6 kg)       Assessment & Plan:   Problem List Items Addressed This Visit     Anxiety   Stable on current medications.  Use of alprazolam  as needed, sparingly.      Gout   Stable on Uloric .  Denies recent flares.  Update uric acid today.      HTN (hypertension), benign   Well controlled, no changes to meds. Encouraged heart healthy diet such as the DASH diet and exercise as tolerated.       Hyperglycemia   hgba1c acceptable, minimize simple carbs. Increase exercise as tolerated.       Hyperlipidemia   Encourage heart healthy diet such as MIND or DASH diet, increase exercise, avoid trans fats, simple carbohydrates and processed foods, consider a krill or fish or flaxseed oil cap daily.         Mild persistent asthma, uncomplicated   Follows with [facility] .  No recent exacerbations.  Stable on current medications.       Thyroid  disease   TSH within normal limits.      Vitamin D  deficiency   Other Visit Diagnoses       Preventative health care    -  Primary     FU 6 months annual physical   HCM: -Mammogram: Last 2022?? -Pap: Last 2020, Due. Referral placed - Colonoscopy: Due - Immunizations: Tdap, Shingles -AAA US :    I am having Victorya Ober maintain her multivitamin, vitamin C, calcium carbonate, b complex vitamins, diphenhydrAMINE , diltiazem, aspirin  EC, hydrochlorothiazide , Vitamin D  (Ergocalciferol ), ALPRAZolam , ketoconazole , SUMAtriptan , metoprolol succinate, fluticasone , levalbuterol , triamcinolone  cream, febuxostat , dexamethasone, fexofenadine, montelukast , and escitalopram .  No orders of the defined types were placed in this encounter.

## 2023-09-29 ENCOUNTER — Encounter: Payer: Self-pay | Admitting: Student

## 2023-09-29 ENCOUNTER — Ambulatory Visit: Payer: Self-pay | Admitting: Student

## 2023-09-29 VITALS — BP 116/80 | HR 60 | Resp 16 | Ht 59.0 in

## 2023-09-29 DIAGNOSIS — F419 Anxiety disorder, unspecified: Secondary | ICD-10-CM

## 2023-09-29 DIAGNOSIS — I10 Essential (primary) hypertension: Secondary | ICD-10-CM

## 2023-09-29 DIAGNOSIS — E079 Disorder of thyroid, unspecified: Secondary | ICD-10-CM | POA: Diagnosis not present

## 2023-09-29 DIAGNOSIS — J4530 Mild persistent asthma, uncomplicated: Secondary | ICD-10-CM

## 2023-09-29 DIAGNOSIS — Z0000 Encounter for general adult medical examination without abnormal findings: Secondary | ICD-10-CM | POA: Diagnosis not present

## 2023-09-29 DIAGNOSIS — R739 Hyperglycemia, unspecified: Secondary | ICD-10-CM | POA: Diagnosis not present

## 2023-09-29 DIAGNOSIS — M109 Gout, unspecified: Secondary | ICD-10-CM | POA: Diagnosis not present

## 2023-09-29 DIAGNOSIS — E559 Vitamin D deficiency, unspecified: Secondary | ICD-10-CM

## 2023-09-29 DIAGNOSIS — Z1211 Encounter for screening for malignant neoplasm of colon: Secondary | ICD-10-CM

## 2023-09-29 DIAGNOSIS — Z1231 Encounter for screening mammogram for malignant neoplasm of breast: Secondary | ICD-10-CM

## 2023-09-29 DIAGNOSIS — E782 Mixed hyperlipidemia: Secondary | ICD-10-CM | POA: Diagnosis not present

## 2023-09-29 LAB — HEMOGLOBIN A1C: Hgb A1c MFr Bld: 5.7 % (ref 4.6–6.5)

## 2023-09-29 LAB — COMPREHENSIVE METABOLIC PANEL WITH GFR
ALT: 15 U/L (ref 0–35)
AST: 25 U/L (ref 0–37)
Albumin: 4.3 g/dL (ref 3.5–5.2)
Alkaline Phosphatase: 76 U/L (ref 39–117)
BUN: 10 mg/dL (ref 6–23)
CO2: 32 meq/L (ref 19–32)
Calcium: 9.2 mg/dL (ref 8.4–10.5)
Chloride: 100 meq/L (ref 96–112)
Creatinine, Ser: 0.72 mg/dL (ref 0.40–1.20)
GFR: 93.86 mL/min (ref >60.00)
Glucose, Bld: 77 mg/dL (ref 70–99)
Potassium: 3.5 meq/L (ref 3.5–5.1)
Sodium: 143 meq/L (ref 135–145)
Total Bilirubin: 0.6 mg/dL (ref 0.2–1.2)
Total Protein: 8.1 g/dL (ref 6.0–8.3)

## 2023-09-29 LAB — CBC WITH DIFFERENTIAL/PLATELET
Basophils Absolute: 0 K/uL (ref 0.0–0.1)
Basophils Relative: 0.8 % (ref 0.0–3.0)
Eosinophils Absolute: 0.3 K/uL (ref 0.0–0.7)
Eosinophils Relative: 6 % — ABNORMAL HIGH (ref 0.0–5.0)
HCT: 39 % (ref 36.0–46.0)
Hemoglobin: 13.1 g/dL (ref 12.0–15.0)
Lymphocytes Relative: 39.1 % (ref 12.0–46.0)
Lymphs Abs: 1.7 K/uL (ref 0.7–4.0)
MCHC: 33.6 g/dL (ref 30.0–36.0)
MCV: 92.3 fl (ref 78.0–100.0)
Monocytes Absolute: 0.4 K/uL (ref 0.1–1.0)
Monocytes Relative: 10.4 % (ref 3.0–12.0)
Neutro Abs: 1.9 K/uL (ref 1.4–7.7)
Neutrophils Relative %: 43.7 % (ref 43.0–77.0)
Platelets: 257 K/uL (ref 150.0–400.0)
RBC: 4.23 Mil/uL (ref 3.87–5.11)
RDW: 13 % (ref 11.5–15.5)
WBC: 4.3 K/uL (ref 4.0–10.5)

## 2023-09-29 LAB — URIC ACID: Uric Acid, Serum: 7.7 mg/dL — ABNORMAL HIGH (ref 2.4–7.0)

## 2023-09-29 LAB — LIPID PANEL
Cholesterol: 234 mg/dL — ABNORMAL HIGH (ref 0–200)
HDL: 60.9 mg/dL (ref >39.00)
LDL Cholesterol: 147 mg/dL — ABNORMAL HIGH (ref 0–99)
NonHDL: 173.35
Total CHOL/HDL Ratio: 4
Triglycerides: 130 mg/dL (ref 0.0–149.0)
VLDL: 26 mg/dL (ref 0.0–40.0)

## 2023-09-29 LAB — VITAMIN D 25 HYDROXY (VIT D DEFICIENCY, FRACTURES): VITD: 36.86 ng/mL (ref 30.00–100.00)

## 2023-09-29 LAB — TSH: TSH: 3.91 u[IU]/mL (ref 0.35–5.50)

## 2023-09-29 MED ORDER — FEBUXOSTAT 40 MG PO TABS
40.0000 mg | ORAL_TABLET | Freq: Every day | ORAL | 1 refills | Status: AC
Start: 2023-09-29 — End: ?

## 2023-09-29 MED ORDER — ESCITALOPRAM OXALATE 5 MG PO TABS
5.0000 mg | ORAL_TABLET | Freq: Every day | ORAL | 1 refills | Status: AC
Start: 2023-09-29 — End: ?

## 2023-09-29 MED ORDER — HYDROCHLOROTHIAZIDE 25 MG PO TABS: 12.5000 mg | ORAL_TABLET | Freq: Every day | ORAL | 0 refills | Status: AC | PRN

## 2023-10-02 ENCOUNTER — Ambulatory Visit: Payer: Self-pay | Admitting: Student

## 2023-10-02 DIAGNOSIS — R768 Other specified abnormal immunological findings in serum: Secondary | ICD-10-CM

## 2023-10-02 DIAGNOSIS — B1920 Unspecified viral hepatitis C without hepatic coma: Secondary | ICD-10-CM

## 2023-10-03 LAB — HIV ANTIBODY (ROUTINE TESTING W REFLEX): HIV 1&2 Ab, 4th Generation: NONREACTIVE

## 2023-10-03 LAB — HCV RNA,QUANTITATIVE REAL TIME PCR
HCV Quantitative Log: 7.36 {Log_IU}/mL — ABNORMAL HIGH
HCV RNA, PCR, QN: 22700000 [IU]/mL — ABNORMAL HIGH

## 2023-10-03 LAB — HEPATITIS C ANTIBODY: Hepatitis C Ab: REACTIVE — AB

## 2023-10-10 DIAGNOSIS — Z1211 Encounter for screening for malignant neoplasm of colon: Secondary | ICD-10-CM | POA: Diagnosis not present

## 2023-10-15 LAB — COLOGUARD: COLOGUARD: NEGATIVE

## 2023-10-18 ENCOUNTER — Other Ambulatory Visit (HOSPITAL_COMMUNITY): Payer: Self-pay

## 2023-10-18 ENCOUNTER — Telehealth: Payer: Self-pay

## 2023-10-18 NOTE — Telephone Encounter (Signed)
 Pharmacy Patient Advocate Encounter  Insurance verification completed.   The patient is insured through [facility]   Ran test claim for Mavyret. Currently a quantity of 84 is a 28 day supply and will need a PA . Ep[place]a will need a PA.  Medication will need to be filled at [facility].  This test claim was processed through [facility]- copay amounts may vary at other pharmacies due to pharmacy/plan contracts, or as the patient moves through the different stages of their insurance plan.

## 2023-10-27 ENCOUNTER — Encounter: Payer: Self-pay | Admitting: Infectious Diseases

## 2023-10-27 ENCOUNTER — Other Ambulatory Visit: Payer: Self-pay

## 2023-10-27 ENCOUNTER — Ambulatory Visit (INDEPENDENT_AMBULATORY_CARE_PROVIDER_SITE_OTHER): Admitting: Infectious Diseases

## 2023-10-27 VITALS — BP 99/58 | HR 56 | Temp 98.3°F | Ht 60.0 in | Wt 128.0 lb

## 2023-10-27 DIAGNOSIS — Z1159 Encounter for screening for other viral diseases: Secondary | ICD-10-CM | POA: Diagnosis not present

## 2023-10-27 DIAGNOSIS — Z0000 Encounter for general adult medical examination without abnormal findings: Secondary | ICD-10-CM | POA: Diagnosis not present

## 2023-10-27 DIAGNOSIS — B182 Chronic viral hepatitis C: Secondary | ICD-10-CM | POA: Diagnosis not present

## 2023-10-27 NOTE — Progress Notes (Addendum)
 [facility]                                      [address], [place], [place], [address]                                               Phn. [phone]; Fax: [phone]                                                               Date: 10/27/23 Reason for Visit: Hepatitis C   HPI: Gail Levine is a 58 y.o.old female with PMH of Asthma, h/o Left breast cancer, Gout, HTN HLD who is referred for evaluation and management of hepatitis C.   She was recently aware of her hepatitis C status.  She appears very surprised regarding how could she have got HCV as she denies risk factors like ever using intravenous drugs/intranasal cocaine, sexual partners with hepatitis C, or blood transfusions. Denies personal or family history of liver disease, Liver cancer. She has not received treatment to date.  She moved from [place] to the [place]  in 1989, lives with her sister, has never been married, and has no children. She does not smoke and consumes alcohol occasionally.  She experiences shortness of breath, attributed to asthma and a heart condition. But no other symptoms like no fever, chills, rashes, joint pain, chest pain, or cough, nausea, vomiting, diarrhea.  Denies any hospitalizations related to liver disease/cirrhosis, jaundice, GI bleeding, mental status changes, abdominal pain/distension, loss of appetite/weight, fatigue, edema and acholic/bloody/black stool.   Denies symptoms of extra GI manifestations like abnormal skin rash,diffuse joint pain/swelling, hx kidney disease  ROS: as above   Current Outpatient Medications on File Prior to Visit  Medication Sig Dispense Refill   ALPRAZolam  (XANAX ) 0.25 MG tablet Take 1 tablet (0.25 mg total) by mouth 2 (two) times daily as needed for anxiety. 20 tablet 2   Ascorbic Acid (VITAMIN C) 1000 MG tablet Take 1,000 mg by mouth daily.     aspirin  EC 81 MG tablet Take 1 tablet (81  mg total) by mouth daily.     b complex vitamins tablet Take 1 tablet by mouth daily.     calcium carbonate (OS-CAL) 600 MG TABS tablet Take 1,800 mg by mouth 2 (two) times daily with a meal.     dexamethasone (DECADRON) 4 MG tablet Take 4 mg by mouth 3 (three) times daily as needed.     diltiazem (CARDIZEM) 30 MG tablet Take 1 tablet (30 mg total) by mouth 3 (three) times daily.     diphenhydrAMINE  (BENADRYL ) 25 MG tablet Take 25 mg by mouth every 6 (six) hours as needed for itching.      escitalopram  (LEXAPRO ) 5 MG tablet Take 1 tablet (5 mg total) by mouth daily. 90 tablet 1   febuxostat  (ULORIC ) 40 MG tablet Take 1 tablet (40 mg total) by mouth daily. 90 tablet 1   fexofenadine (ALLEGRA ALLERGY ) 180 MG tablet Take 180 mg by mouth daily.  fluticasone  (FLONASE ) 50 MCG/ACT nasal spray Place 1 spray into both nostrils in the morning and at bedtime. 16 g 5   hydrochlorothiazide  (HYDRODIURIL ) 25 MG tablet Take 0.5 tablets (12.5 mg total) by mouth daily as needed. 1/2 tablet by mouth every day 90 tablet 0   ketoconazole  (NIZORAL ) 2 % shampoo Apply 1 application topically 2 (two) times a week. 120 mL 1   levalbuterol  (XOPENEX  HFA) 45 MCG/ACT inhaler Inhale 1 puff into the lungs every 6 (six) hours as needed for wheezing or shortness of breath. 15 g 1   metoprolol succinate (TOPROL-XL) 25 MG 24 hr tablet Take 1 tablet by mouth daily.     montelukast  (SINGULAIR ) 10 MG tablet TAKE 1 TABLET BY MOUTH EVERYDAY AT BEDTIME 30 tablet 5   Multiple Vitamin (MULTIVITAMIN) tablet Take 1 tablet by mouth daily.     SUMAtriptan  (IMITREX ) 100 MG tablet Take 1 tablet (100 mg total) by mouth every 2 (two) hours as needed for migraine. May repeat in 2 hours if headache persists or recurs. 10 tablet 3   triamcinolone  cream (KENALOG ) 0.1 % Apply 1 Application topically 2 (two) times daily. Apply twice daily to affected area. 30 g 3   Vitamin D , Ergocalciferol , (DRISDOL ) 1.25 MG (50000 UT) CAPS capsule Take 1 capsule  (50,000 Units total) by mouth every 7 (seven) days. 4 capsule 5   No current facility-administered medications on file prior to visit.    Allergies  Allergen Reactions   Sulfa Antibiotics Anaphylaxis and Other (Strength Comments)   Penicillins Rash   Past Medical History:  Diagnosis Date   Acute bronchitis 09/09/2015   Allergy     Arthritis    Asthma    Asthma 09/09/2015   Back pain 01/08/2016   Breast cancer, left breast (HCC) 2007   Left Breast Cancer   Cancer (HCC) 2007   Cervical cancer screening 01/01/2015   Constipation 11/23/2015   Dermatitis 01/04/2015   Estrogen deficient vulvovaginitis 09/14/2014   Gout    Gout 06/03/2016   HIV infection (HCC)    HTN (hypertension), benign 11/23/2015   Hyperlipidemia    Positive PPD    Preventative health care 01/20/2017   Pruritus 09/04/2014   Thyroid  disease    Urticaria    Vitamin D  deficiency    Past Surgical History:  Procedure Laterality Date   MASTECTOMY Left    Social History   Socioeconomic History   Marital status: Single    Spouse name: Not on file   Number of children: Not on file   Years of education: Not on file   Highest education level: Not on file  Occupational History   Occupation: patient care coordinator  Tobacco Use   Smoking status: Never    Passive exposure: Never   Smokeless tobacco: Never  Vaping Use   Vaping status: Never Used  Substance and Sexual Activity   Alcohol use: Yes    Alcohol/week: 0.0 standard drinks of alcohol   Drug use: No   Sexual activity: Never  Other Topics Concern   Not on file  Social History Narrative   Not on file   Social Drivers of Health   Financial Resource Strain: Low Risk  (03/31/2017)   Received from [facility]   Overall Financial Resource Strain (CARDIA)    Difficulty of Paying Living Expenses: Not hard at all  Food Insecurity: Unknown (03/31/2017)   Received from [facility]   Hunger Vital Sign    Worried About  Running Out of Food in the Last Year: Patient declined    Ran Out of Food in the Last Year: Patient declined  Transportation Needs: Not on file  Physical Activity: Unknown (03/31/2017)   Received from [facility]   Exercise Vital Sign    Days of Exercise per Week: Patient declined    Minutes of Exercise per Session: Patient declined  Stress: Unknown (03/31/2017)   Received from [facility] Occupational Health - Occupational Stress Questionnaire    Feeling of Stress : Patient declined  Social Connections: Unknown (03/31/2017)   Received from [facility]   Social Connection and Isolation Panel    Frequency of Communication with Friends and Family: Patient declined    Frequency of Social Gatherings with Friends and Family: Patient declined    Attends Religious Services: Patient declined    Database administrator or Organizations: Patient declined    Attends Engineer, structural: Patient declined    Marital Status: Patient declined  Catering manager Violence: Not on file   Family History  Problem Relation Age of Onset   Cancer Mother        cervical cancer   Hypertension Father    Stroke Father    Heart disease Father    Diabetes Father    Pulmonary fibrosis Father    Cancer Sister        cervical cancer   Vitals  BP (!) 99/58   Pulse (!) 56   Temp 98.3 F (36.8 C) (Oral)   Ht 5' (1.524 m)   Wt 128 lb (58.1 kg)   SpO2 98%   BMI 25.00 kg/m   Exam  Gen:  no acute distress HEENT: [place]/AT, no scleral icterus, no pale conjunctivae, hearing normal, oral mucosa moist Neck: Supple Cardio: Regular rate and rhythm, S1S2 Resp: Pulmonary effort normal on room air, Normal breath sounds GI: Soft, nontender, nondistended GU: MSK - no pedal edema Skin: No rashes Neuro: Grossly non focal, awake, alert and oriented * 3 Psych: Calm, cooperative  Laboratory     Latest Ref Rng & Units 09/29/2023    9:22  AM 09/23/2022   10:09 AM 11/23/2020    3:49 PM  CBC  WBC 4.0 - 10.5 K/uL 4.3  4.8  4.3   Hemoglobin 12.0 - 15.0 g/dL 86.8  87.0  87.8   Hematocrit 36.0 - 46.0 % 39.0  39.9  37.6   Platelets 150.0 - 400.0 K/uL 257.0  222.0  206.0       Latest Ref Rng & Units 09/29/2023    9:22 AM 09/23/2022   10:09 AM 11/23/2020    3:49 PM  CMP  Glucose 70 - 99 mg/dL 77  847  897   BUN 6 - 23 mg/dL 10  14  12    Creatinine 0.40 - 1.20 mg/dL 9.27  9.25  9.35   Sodium 135 - 145 mEq/L 143  141  139   Potassium 3.5 - 5.1 mEq/L 3.5  3.7  3.6   Chloride 96 - 112 mEq/L 100  99  99   CO2 19 - 32 mEq/L 32  32  33   Calcium 8.4 - 10.5 mg/dL 9.2  9.0  8.9   Total Protein 6.0 - 8.3 g/dL 8.1  7.9  7.4   Total Bilirubin 0.2 - 1.2 mg/dL 0.6  0.7  0.6   Alkaline Phos 39 - 117 U/L 76  71  60  AST 0 - 37 U/L 25  32  32   ALT 0 - 35 U/L 15  21  21     Assessment/Plan: # Chronic Hepatitis C, Tx naive - 09/29/23 HCV RNA around 29 million - Labs today, 8/29 CBC and CMP unremarkable  - US  Liver w elastography  - Fu pending above   # Health care maintenance - Hep A total ab, HBV serology   I spent 65 minutes involved in face-to-face and non-face-to-face activities for this patient on the day of the visit. Professional time spent includes the following activities: Preparing to [place] the patient (review of tests), Obtaining and reviewing separately obtained history (Outside referral notes), Performing a medically appropriate examination and evaluation, Ordering labs/imaging, referring and communicating with other health care professionals, Documenting clinical information in the EMR, Independently interpreting results (not separately reported), Communicating results to the patient, Counseling and educating the patient and Care coordination (not separately reported).   Patients questions were addressed and answered.   Of note, portions of this note may have been created with voice recognition software. While this note has  been edited for accuracy, occasional wrong-word or 'sound-a-like' substitutions may have occurred due to the inherent limitations of voice recognition software.   Electronically signed by:  Annalee Orem, MD Infectious Diseases  Office phone [phone] Fax no. [phone]

## 2023-11-02 ENCOUNTER — Ambulatory Visit (HOSPITAL_BASED_OUTPATIENT_CLINIC_OR_DEPARTMENT_OTHER)
Admission: RE | Admit: 2023-11-02 | Discharge: 2023-11-02 | Disposition: A | Discharge status: Home / Self Care | Source: Ambulatory Visit | Attending: Student | Admitting: Student

## 2023-11-02 ENCOUNTER — Encounter (HOSPITAL_BASED_OUTPATIENT_CLINIC_OR_DEPARTMENT_OTHER): Payer: Self-pay

## 2023-11-02 ENCOUNTER — Other Ambulatory Visit: Payer: Self-pay | Admitting: Student

## 2023-11-02 DIAGNOSIS — Z1231 Encounter for screening mammogram for malignant neoplasm of breast: Secondary | ICD-10-CM

## 2023-11-02 DIAGNOSIS — Z1211 Encounter for screening for malignant neoplasm of colon: Secondary | ICD-10-CM

## 2023-11-02 DIAGNOSIS — E079 Disorder of thyroid, unspecified: Secondary | ICD-10-CM

## 2023-11-02 DIAGNOSIS — Z0000 Encounter for general adult medical examination without abnormal findings: Secondary | ICD-10-CM

## 2023-11-02 DIAGNOSIS — J4530 Mild persistent asthma, uncomplicated: Secondary | ICD-10-CM

## 2023-11-02 DIAGNOSIS — E782 Mixed hyperlipidemia: Secondary | ICD-10-CM

## 2023-11-02 DIAGNOSIS — F419 Anxiety disorder, unspecified: Secondary | ICD-10-CM

## 2023-11-02 DIAGNOSIS — M109 Gout, unspecified: Secondary | ICD-10-CM

## 2023-11-02 DIAGNOSIS — R739 Hyperglycemia, unspecified: Secondary | ICD-10-CM

## 2023-11-02 DIAGNOSIS — E559 Vitamin D deficiency, unspecified: Secondary | ICD-10-CM

## 2023-11-02 DIAGNOSIS — I10 Essential (primary) hypertension: Secondary | ICD-10-CM

## 2023-11-02 LAB — HEPATITIS B CORE ANTIBODY, TOTAL: Hep B Core Total Ab: REACTIVE — AB

## 2023-11-02 LAB — LIVER FIBROSIS, FIBROTEST-ACTITEST
ALT: 12 U/L (ref 6–29)
Alpha-2-Macroglobulin: 276 mg/dL (ref 106–279)
Apolipoprotein A1: 181 mg/dL (ref 101–198)
Bilirubin: 0.6 mg/dL (ref 0.2–1.2)
Fibrosis Score: 0.22
GGT: 13 U/L (ref 3–70)
Haptoglobin: 103 mg/dL (ref 43–212)
Necroinflammat ACT Score: 0.03
Reference ID: 5718129

## 2023-11-02 LAB — HIV ANTIBODY (ROUTINE TESTING W REFLEX)
HIV 1&2 Ab, 4th Generation: NONREACTIVE
HIV FINAL INTERPRETATION: NEGATIVE

## 2023-11-02 LAB — PROTIME-INR
INR: 1
Prothrombin Time: 10.9 s (ref 9.0–11.5)

## 2023-11-02 LAB — HEPATIC FUNCTION PANEL
AG Ratio: 1.2 (calc) (ref 1.0–2.5)
ALT: 14 U/L (ref 6–29)
AST: 25 U/L (ref 10–35)
Albumin: 4.2 g/dL (ref 3.6–5.1)
Alkaline phosphatase (APISO): 67 U/L (ref 37–153)
Bilirubin, Direct: 0.1 mg/dL (ref 0.0–0.2)
Globulin: 3.4 g/dL (ref 1.9–3.7)
Indirect Bilirubin: 0.5 mg/dL (ref 0.2–1.2)
Total Bilirubin: 0.6 mg/dL (ref 0.2–1.2)
Total Protein: 7.6 g/dL (ref 6.1–8.1)

## 2023-11-02 LAB — HEPATITIS B SURFACE ANTIGEN: Hepatitis B Surface Ag: NONREACTIVE

## 2023-11-02 LAB — HEPATITIS B SURFACE ANTIBODY,QUALITATIVE: Hep B S Ab: NONREACTIVE

## 2023-11-02 LAB — HEPATITIS C GENOTYPE: HCV Genotype: 6

## 2023-11-02 LAB — HEPATITIS A ANTIBODY, TOTAL: Hepatitis A AB,Total: REACTIVE — AB

## 2023-11-02 LAB — HEPATITIS C RNA QUANTITATIVE
HCV Quantitative Log: 7.52 {Log_IU}/mL — ABNORMAL HIGH
HCV RNA, PCR, QN: 33300000 [IU]/mL — ABNORMAL HIGH

## 2023-11-06 ENCOUNTER — Ambulatory Visit: Payer: Self-pay | Admitting: Student

## 2023-11-07 ENCOUNTER — Ambulatory Visit (HOSPITAL_COMMUNITY)
Admission: RE | Admit: 2023-11-07 | Discharge: 2023-11-07 | Disposition: A | Discharge status: Home / Self Care | Source: Ambulatory Visit | Attending: Infectious Diseases | Admitting: Infectious Diseases

## 2023-11-07 DIAGNOSIS — B182 Chronic viral hepatitis C: Secondary | ICD-10-CM | POA: Diagnosis not present

## 2023-11-20 ENCOUNTER — Ambulatory Visit: Payer: Self-pay | Admitting: Infectious Diseases

## 2023-11-21 ENCOUNTER — Telehealth: Payer: Self-pay

## 2023-11-21 ENCOUNTER — Other Ambulatory Visit (HOSPITAL_COMMUNITY): Payer: Self-pay

## 2023-11-21 NOTE — Progress Notes (Signed)
 Her profile states she speaks [place]!

## 2023-11-21 NOTE — Progress Notes (Signed)
 Can you all look into Mavyret x 8 weeks for her? Thanks!

## 2023-11-21 NOTE — Telephone Encounter (Signed)
 Submitted a Prior Authorization request to [facility] for Mavyret via Latent. Will update once we receive a response.  J Code: CPT:  PA ID: [id number]

## 2023-11-23 ENCOUNTER — Other Ambulatory Visit (HOSPITAL_COMMUNITY): Payer: Self-pay

## 2023-11-23 ENCOUNTER — Telehealth: Payer: Self-pay

## 2023-11-23 DIAGNOSIS — B182 Chronic viral hepatitis C: Secondary | ICD-10-CM

## 2023-11-23 NOTE — Telephone Encounter (Addendum)
 Received notification from [facility] regarding a prior authorization for Epclusa Brand Name. Authorization has been APPROVED from 11/22/23 to 02/14/24.    Patient must fill through [facility]  Authorization # [id number]  Copay Card $5.00

## 2023-11-24 MED ORDER — EPCLUSA 400-100 MG PO TABS: 1.0000 | ORAL_TABLET | Freq: Every day | ORAL | 2 refills | Status: AC

## 2023-11-24 NOTE — Addendum Note (Signed)
 Addended by: WADDELL ALAN PARAS on: 11/24/2023 09:09 AM   Modules accepted: Orders

## 2023-12-05 ENCOUNTER — Telehealth: Payer: Self-pay

## 2023-12-05 NOTE — Telephone Encounter (Signed)
 Attempted to reach out to patient regarding Epclusa and offer counseling. Unable to reach patient but left a voicemail to give us  a call back.

## 2023-12-12 ENCOUNTER — Telehealth: Payer: Self-pay

## 2023-12-12 NOTE — Telephone Encounter (Signed)
 Good morning Dr. Dea, We have tried reaching this patient multiple times via phone and MyChart without response. It seems patient received Epclusa ~1-2 weeks ago. Will try to reach for 2-month follow-up and counseling.  Alan Geralds, PharmD, CPP, BCIDP, AAHIVP Clinical Pharmacist Practitioner Infectious Diseases Clinical Pharmacist [facility]

## 2023-12-12 NOTE — Telephone Encounter (Signed)
 Spoke to patient and provided Epclusa counseling. Patient expressed some concerns regarding copay coverage after December 31st. I told the patient that the current copay should be $5.00 and the authorization has been approved through 02/14/2024. Informed her that if there are any future issues, will have patient assistance/advocate team help when the situation arises. I did explain to her that she would need [facility] to be [facility] and attempted to provider her the phone number [phone]), but patient did not want to call. Counseled on the importance of adherence to optimize treatment and we also discussed the following:   Counseling on Epclusa: - Take 1 tablet daily with or without food for 12 weeks  - Try to take the medication around the same time each day  - If you take any antacids, please separate from Epclusa by at least 4 hours - If taking omeprazole over-the-counter, please take Epclusa with food 4 hours prior to omeprazole - If taking famotidine over-the-counter, please separate from Epclusa by at least 12 hours - Common side effects may include fatigue, nausea, and headache - which should subside after a few weeks    Thank you,  Feliciano Close, PharmD PGY2 Infectious Diseases Pharmacy Resident

## 2023-12-12 NOTE — Telephone Encounter (Signed)
 Thank you :)

## 2024-01-08 NOTE — Progress Notes (Unsigned)
 HPI: Gail Levine is a 58 y.o. female who presents to the [facility] pharmacy clinic for Hepatitis C follow-up.  Referring ID Physician: Dr. Dea  Medication: Epclusa  x 12 weeks   Start Date: early November  Hepatitis C Genotype: 6  Fibrosis Score: F0-F1  Hepatitis C RNA: 33.3 million (10/27/23)  Patient Active Problem List   Diagnosis Date Noted   Chronic hepatitis C without hepatic coma (HCC) 10/27/2023   Health care maintenance 10/27/2023   Need for hepatitis B screening test 10/27/2023   Preventative health care 09/29/2023   Dental decay 09/19/2022   Mild persistent asthma, uncomplicated 06/10/2022   Flexural atopic dermatitis 06/10/2022   Seasonal and perennial allergic rhinitis 06/10/2022   Skin rash 04/12/2021   Allergies 04/12/2021   History of COVID-19 03/16/2020   Anxiety 09/07/2018   Hyperglycemia 05/05/2017   Encounter for screening for malignant neoplasm of breast 01/20/2017   Gout 06/03/2016   Back pain 01/08/2016   Constipation 11/23/2015   HTN (hypertension), benign 11/23/2015   Chest pain 09/10/2015   Muscle spasm 09/09/2015   Palpitations 09/09/2015   Cervical cancer screening 01/01/2015   Estrogen deficient vulvovaginitis 09/14/2014   Malignant neoplasm of nipple of left breast in female, estrogen receptor negative (HCC)    Positive PPD    Hyperlipidemia    Pruritus 09/04/2014   Vitamin D  deficiency    Thyroid  disease     Patient's Medications  New Prescriptions   No medications on file  Previous Medications   ALPRAZOLAM  (XANAX ) 0.25 MG TABLET    Take 1 tablet (0.25 mg total) by mouth 2 (two) times daily as needed for anxiety.   ASCORBIC ACID (VITAMIN C) 1000 MG TABLET    Take 1,000 mg by mouth daily.   ASPIRIN  EC 81 MG TABLET    Take 1 tablet (81 mg total) by mouth daily.   B COMPLEX VITAMINS TABLET    Take 1 tablet by mouth daily.   CALCIUM CARBONATE (OS-CAL) 600 MG TABS TABLET    Take 1,800 mg by mouth 2 (two) times daily with a meal.    DEXAMETHASONE (DECADRON) 4 MG TABLET    Take 4 mg by mouth 3 (three) times daily as needed.   DILTIAZEM (CARDIZEM) 30 MG TABLET    Take 1 tablet (30 mg total) by mouth 3 (three) times daily.   DIPHENHYDRAMINE  (BENADRYL ) 25 MG TABLET    Take 25 mg by mouth every 6 (six) hours as needed for itching.    EPCLUSA  400-100 MG TABS    Take 1 tablet by mouth daily.   ESCITALOPRAM  (LEXAPRO ) 5 MG TABLET    Take 1 tablet (5 mg total) by mouth daily.   FEBUXOSTAT  (ULORIC ) 40 MG TABLET    Take 1 tablet (40 mg total) by mouth daily.   FEXOFENADINE (ALLEGRA ALLERGY ) 180 MG TABLET    Take 180 mg by mouth daily.   FLUTICASONE  (FLONASE ) 50 MCG/ACT NASAL SPRAY    Place 1 spray into both nostrils in the morning and at bedtime.   HYDROCHLOROTHIAZIDE  (HYDRODIURIL ) 25 MG TABLET    Take 0.5 tablets (12.5 mg total) by mouth daily as needed. 1/2 tablet by mouth every day   KETOCONAZOLE  (NIZORAL ) 2 % SHAMPOO    Apply 1 application topically 2 (two) times a week.   LEVALBUTEROL  (XOPENEX  HFA) 45 MCG/ACT INHALER    Inhale 1 puff into the lungs every 6 (six) hours as needed for wheezing or shortness of breath.   METOPROLOL SUCCINATE (TOPROL-XL)  25 MG 24 HR TABLET    Take 1 tablet by mouth daily.   MONTELUKAST  (SINGULAIR ) 10 MG TABLET    TAKE 1 TABLET BY MOUTH EVERYDAY AT BEDTIME   MULTIPLE VITAMIN (MULTIVITAMIN) TABLET    Take 1 tablet by mouth daily.   SUMATRIPTAN  (IMITREX ) 100 MG TABLET    Take 1 tablet (100 mg total) by mouth every 2 (two) hours as needed for migraine. May repeat in 2 hours if headache persists or recurs.   TRIAMCINOLONE  CREAM (KENALOG ) 0.1 %    Apply 1 Application topically 2 (two) times daily. Apply twice daily to affected area.   VITAMIN D , ERGOCALCIFEROL , (DRISDOL ) 1.25 MG (50000 UT) CAPS CAPSULE    Take 1 capsule (50,000 Units total) by mouth every 7 (seven) days.  Modified Medications   No medications on file  Discontinued Medications   No medications on file    Allergies: Allergies  Allergen  Reactions   Sulfa Antibiotics Anaphylaxis and Other (Posten Comments)   Penicillins Rash    Past Medical History: Past Medical History:  Diagnosis Date   Acute bronchitis 09/09/2015   Allergy     Arthritis    Asthma    Asthma 09/09/2015   Back pain 01/08/2016   Breast cancer, left breast (HCC) 2007   Left Breast Cancer   Cancer (HCC) 2007   Cervical cancer screening 01/01/2015   Constipation 11/23/2015   Dermatitis 01/04/2015   Estrogen deficient vulvovaginitis 09/14/2014   Gout    Gout 06/03/2016   HIV infection (HCC)    HTN (hypertension), benign 11/23/2015   Hyperlipidemia    Positive PPD    Preventative health care 01/20/2017   Pruritus 09/04/2014   Thyroid  disease    Urticaria    Vitamin D  deficiency     Social History: Social History   Socioeconomic History   Marital status: Single    Spouse name: Not on file   Number of children: Not on file   Years of education: Not on file   Highest education level: Not on file  Occupational History   Occupation: patient care coordinator  Tobacco Use   Smoking status: Never    Passive exposure: Never   Smokeless tobacco: Never  Vaping Use   Vaping status: Never Used  Substance and Sexual Activity   Alcohol use: Yes    Alcohol/week: 0.0 standard drinks of alcohol   Drug use: No   Sexual activity: Never  Other Topics Concern   Not on file  Social History Narrative   Not on file   Social Drivers of Health   Financial Resource Strain: Low Risk  (03/31/2017)   Received from [facility]   Overall Financial Resource Strain (CARDIA)    Difficulty of Paying Living Expenses: Not hard at all  Food Insecurity: Unknown (03/31/2017)   Received from [facility]   Hunger Vital Sign    Worried About Running Out of Food in the Last Year: Patient declined    Ran Out of Food in the Last Year: Patient declined  Transportation Needs: Not on file  Physical Activity: Unknown (03/31/2017)   Received  from [facility]   Exercise Vital Sign    Days of Exercise per Week: Patient declined    Minutes of Exercise per Session: Patient declined  Stress: Unknown (03/31/2017)   Received from [facility] Occupational Health - Occupational Stress Questionnaire    Feeling of Stress : Patient  declined  Social Connections: Unknown (03/31/2017)   Received from [facility]   Social Connection and Isolation Panel    Frequency of Communication with Friends and Family: Patient declined    Frequency of Social Gatherings with Friends and Family: Patient declined    Attends Religious Services: Patient declined    Active Member of Clubs or Organizations: Patient declined    Attends Banker Meetings: Patient declined    Marital Status: Patient declined    Labs: Hepatitis C Lab Results  Component Value Date   HCVGENOTYPE 6 10/27/2023   HEPCAB REACTIVE (A) 09/29/2023   HCVRNAPCRQN 33,300,000 (H) 10/27/2023   HCVRNAPCRQN 22,700,000 (H) 09/29/2023   FIBROSTAGE F0-F1 10/27/2023   Hepatitis B Lab Results  Component Value Date   HEPBSAB NON-REACTIVE 10/27/2023   HEPBSAG NON-REACTIVE 10/27/2023   HEPBCAB REACTIVE (A) 10/27/2023   Hepatitis A Lab Results  Component Value Date   HAV REACTIVE (A) 10/27/2023   HIV Lab Results  Component Value Date   HIV NON-REACTIVE 10/27/2023   HIV NON-REACTIVE 09/29/2023   Lab Results  Component Value Date   CREATININE 0.72 09/29/2023   CREATININE 0.74 09/23/2022   CREATININE 0.64 11/23/2020   CREATININE 0.69 06/22/2020   CREATININE 0.69 02/23/2018   Lab Results  Component Value Date   AST 25 10/27/2023   AST 25 09/29/2023   AST 32 09/23/2022   ALT 14 10/27/2023   ALT 12 10/27/2023   ALT 15 09/29/2023   INR 1.0 10/27/2023    Assessment: Gail Levine presents to clinic today for HCV follow-up. She has been taking Epclusa  for ~4 weeks and has not missed any doses. Has been  tolerating the medication well. Will check HCV RNA today and follow-up in 8 weeks. Eligible for HBV, flu, COVID, PCV20, and Shingles vaccinations; ***.   Plan: - Check HCV RNA - Continue Epclusa  x 8 weeks  - Follow up with Dr. Dea on ***  Alan Geralds, PharmD, CPP, BCIDP, AAHIVP Clinical Pharmacist Practitioner Infectious Diseases Clinical Pharmacist [facility] 01/08/2024, 3:40 PM

## 2024-01-10 ENCOUNTER — Ambulatory Visit: Admitting: Pharmacist

## 2024-01-10 ENCOUNTER — Other Ambulatory Visit: Payer: Self-pay

## 2024-01-10 DIAGNOSIS — B182 Chronic viral hepatitis C: Secondary | ICD-10-CM

## 2024-01-12 ENCOUNTER — Ambulatory Visit: Admitting: Pharmacist

## 2024-01-12 LAB — HEPATITIS C RNA QUANTITATIVE
HCV Quantitative Log: 1.41 {Log_IU}/mL — ABNORMAL HIGH
HCV RNA, PCR, QN: 26 [IU]/mL — ABNORMAL HIGH

## 2024-03-20 ENCOUNTER — Ambulatory Visit: Admitting: Infectious Diseases

## 2024-03-29 ENCOUNTER — Ambulatory Visit: Admitting: Student

## 2024-07-09 ENCOUNTER — Ambulatory Visit: Admitting: Allergy & Immunology
